# Patient Record
Sex: Male | Born: 1945 | Race: White | Hispanic: No | Marital: Married | Smoking: Former smoker
Health system: Southern US, Community
[De-identification: ages and names within clinical notes are randomized; demographics above are authoritative.]

## PROBLEM LIST (undated history)

## (undated) DIAGNOSIS — C3490 Malignant neoplasm of unspecified part of unspecified bronchus or lung: Secondary | ICD-10-CM

## (undated) DIAGNOSIS — D696 Thrombocytopenia, unspecified: Secondary | ICD-10-CM

## (undated) DIAGNOSIS — R161 Splenomegaly, not elsewhere classified: Secondary | ICD-10-CM

## (undated) DIAGNOSIS — I509 Heart failure, unspecified: Secondary | ICD-10-CM

## (undated) DIAGNOSIS — K7460 Unspecified cirrhosis of liver: Secondary | ICD-10-CM

## (undated) DIAGNOSIS — I10 Essential (primary) hypertension: Secondary | ICD-10-CM

## (undated) DIAGNOSIS — Z9221 Personal history of antineoplastic chemotherapy: Secondary | ICD-10-CM

## (undated) DIAGNOSIS — E119 Type 2 diabetes mellitus without complications: Secondary | ICD-10-CM

## (undated) DIAGNOSIS — H3320 Serous retinal detachment, unspecified eye: Secondary | ICD-10-CM

## (undated) DIAGNOSIS — K922 Gastrointestinal hemorrhage, unspecified: Secondary | ICD-10-CM

## (undated) DIAGNOSIS — H269 Unspecified cataract: Secondary | ICD-10-CM

## (undated) DIAGNOSIS — J449 Chronic obstructive pulmonary disease, unspecified: Secondary | ICD-10-CM

## (undated) DIAGNOSIS — R809 Proteinuria, unspecified: Secondary | ICD-10-CM

## (undated) DIAGNOSIS — E785 Hyperlipidemia, unspecified: Secondary | ICD-10-CM

## (undated) DIAGNOSIS — R7989 Other specified abnormal findings of blood chemistry: Secondary | ICD-10-CM

## (undated) DIAGNOSIS — D509 Iron deficiency anemia, unspecified: Secondary | ICD-10-CM

## (undated) DIAGNOSIS — J90 Pleural effusion, not elsewhere classified: Secondary | ICD-10-CM

## (undated) DIAGNOSIS — R188 Other ascites: Secondary | ICD-10-CM

## (undated) HISTORY — DX: Essential (primary) hypertension: I10

## (undated) HISTORY — DX: Gastrointestinal hemorrhage, unspecified: K92.2

## (undated) HISTORY — DX: Other specified abnormal findings of blood chemistry: R79.89

## (undated) HISTORY — PX: LAMINECTOMY: SHX219

## (undated) HISTORY — DX: Serous retinal detachment, unspecified eye: H33.20

## (undated) HISTORY — DX: Type 2 diabetes mellitus without complications: E11.9

## (undated) HISTORY — DX: Pleural effusion, not elsewhere classified: J90

## (undated) HISTORY — PX: OTHER SURGICAL HISTORY: SHX169

## (undated) HISTORY — DX: Unspecified cirrhosis of liver: K74.60

## (undated) HISTORY — DX: Proteinuria, unspecified: R80.9

## (undated) HISTORY — DX: Hyperlipidemia, unspecified: E78.5

## (undated) HISTORY — DX: Malignant neoplasm of unspecified part of unspecified bronchus or lung: C34.90

## (undated) HISTORY — DX: Thrombocytopenia, unspecified: D69.6

## (undated) HISTORY — DX: Chronic obstructive pulmonary disease, unspecified: J44.9

## (undated) HISTORY — DX: Unspecified cataract: H26.9

## (undated) HISTORY — DX: Heart failure, unspecified: I50.9

## (undated) HISTORY — DX: Other ascites: R18.8

## (undated) HISTORY — DX: Splenomegaly, not elsewhere classified: R16.1

## (undated) HISTORY — DX: Iron deficiency anemia, unspecified: D50.9

## (undated) HISTORY — DX: Personal history of antineoplastic chemotherapy: Z92.21

---

## 2003-09-29 ENCOUNTER — Other Ambulatory Visit: Payer: Self-pay

## 2003-11-20 ENCOUNTER — Ambulatory Visit: Payer: Self-pay | Admitting: Unknown Physician Specialty

## 2004-03-07 ENCOUNTER — Ambulatory Visit: Payer: Self-pay | Admitting: Orthopaedic Surgery

## 2008-05-18 ENCOUNTER — Ambulatory Visit: Payer: Self-pay | Admitting: Internal Medicine

## 2010-05-29 ENCOUNTER — Ambulatory Visit: Payer: Self-pay | Admitting: Cardiology

## 2010-06-18 ENCOUNTER — Ambulatory Visit: Payer: Self-pay | Admitting: Cardiology

## 2010-10-15 ENCOUNTER — Ambulatory Visit: Payer: Self-pay | Admitting: Ophthalmology

## 2010-11-05 ENCOUNTER — Ambulatory Visit: Payer: Self-pay | Admitting: Ophthalmology

## 2011-01-13 ENCOUNTER — Ambulatory Visit: Payer: Self-pay | Admitting: Unknown Physician Specialty

## 2011-07-30 ENCOUNTER — Ambulatory Visit: Payer: Self-pay | Admitting: Cardiology

## 2011-07-30 LAB — CK TOTAL AND CKMB (NOT AT ARMC): CK-MB: 2 ng/mL (ref 0.5–3.6)

## 2011-07-31 LAB — BASIC METABOLIC PANEL
Calcium, Total: 8.5 mg/dL (ref 8.5–10.1)
Chloride: 99 mmol/L (ref 98–107)
Co2: 26 mmol/L (ref 21–32)
Creatinine: 2.04 mg/dL — ABNORMAL HIGH (ref 0.60–1.30)
EGFR (African American): 39 — ABNORMAL LOW
Potassium: 4.2 mmol/L (ref 3.5–5.1)
Sodium: 133 mmol/L — ABNORMAL LOW (ref 136–145)

## 2012-04-29 ENCOUNTER — Ambulatory Visit: Payer: Self-pay | Admitting: Cardiology

## 2012-09-27 ENCOUNTER — Ambulatory Visit: Payer: Self-pay | Admitting: Internal Medicine

## 2012-10-18 ENCOUNTER — Inpatient Hospital Stay: Payer: Self-pay | Admitting: Internal Medicine

## 2012-10-18 LAB — CBC
HCT: 16.1 % — ABNORMAL LOW (ref 40.0–52.0)
HGB: 4.9 g/dL — CL (ref 13.0–18.0)
MCHC: 30.3 g/dL — ABNORMAL LOW (ref 32.0–36.0)
MCV: 71 fL — ABNORMAL LOW (ref 80–100)
Platelet: 88 10*3/uL — ABNORMAL LOW (ref 150–440)
RBC: 2.28 10*6/uL — ABNORMAL LOW (ref 4.40–5.90)
RDW: 20.2 % — ABNORMAL HIGH (ref 11.5–14.5)
WBC: 3.9 10*3/uL (ref 3.8–10.6)

## 2012-10-18 LAB — COMPREHENSIVE METABOLIC PANEL
Albumin: 3 g/dL — ABNORMAL LOW (ref 3.4–5.0)
Anion Gap: 7 (ref 7–16)
BUN: 31 mg/dL — ABNORMAL HIGH (ref 7–18)
Bilirubin,Total: 0.9 mg/dL (ref 0.2–1.0)
Calcium, Total: 8.9 mg/dL (ref 8.5–10.1)
Chloride: 103 mmol/L (ref 98–107)
Creatinine: 1.88 mg/dL — ABNORMAL HIGH (ref 0.60–1.30)
EGFR (African American): 42 — ABNORMAL LOW
EGFR (Non-African Amer.): 36 — ABNORMAL LOW
Glucose: 118 mg/dL — ABNORMAL HIGH (ref 65–99)
SGOT(AST): 39 U/L — ABNORMAL HIGH (ref 15–37)
Sodium: 137 mmol/L (ref 136–145)
Total Protein: 7.3 g/dL (ref 6.4–8.2)

## 2012-10-18 LAB — TROPONIN I: Troponin-I: 0.28 ng/mL — ABNORMAL HIGH

## 2012-10-19 LAB — CBC WITH DIFFERENTIAL/PLATELET
Basophil #: 0 10*3/uL (ref 0.0–0.1)
Basophil %: 0.8 %
Eosinophil %: 2.4 %
HCT: 20.8 % — ABNORMAL LOW (ref 40.0–52.0)
HGB: 6.5 g/dL — ABNORMAL LOW (ref 13.0–18.0)
Lymphocyte #: 0.8 10*3/uL — ABNORMAL LOW (ref 1.0–3.6)
MCH: 23 pg — ABNORMAL LOW (ref 26.0–34.0)
MCHC: 31.1 g/dL — ABNORMAL LOW (ref 32.0–36.0)
MCV: 74 fL — ABNORMAL LOW (ref 80–100)
Monocyte #: 0.5 x10 3/mm (ref 0.2–1.0)
Neutrophil #: 2.9 10*3/uL (ref 1.4–6.5)
Platelet: 91 10*3/uL — ABNORMAL LOW (ref 150–440)
RBC: 2.81 10*6/uL — ABNORMAL LOW (ref 4.40–5.90)
WBC: 4.3 10*3/uL (ref 3.8–10.6)

## 2012-10-19 LAB — BASIC METABOLIC PANEL
Anion Gap: 8 (ref 7–16)
Creatinine: 2.03 mg/dL — ABNORMAL HIGH (ref 0.60–1.30)
EGFR (African American): 38 — ABNORMAL LOW
Potassium: 3.5 mmol/L (ref 3.5–5.1)
Sodium: 136 mmol/L (ref 136–145)

## 2012-10-19 LAB — TROPONIN I: Troponin-I: 0.27 ng/mL — ABNORMAL HIGH

## 2012-10-19 LAB — CK TOTAL AND CKMB (NOT AT ARMC)
CK, Total: 135 U/L (ref 35–232)
CK-MB: 2.5 ng/mL (ref 0.5–3.6)
CK-MB: 2.3 ng/mL (ref 0.5–3.6)

## 2012-10-20 LAB — BASIC METABOLIC PANEL
BUN: 26 mg/dL — ABNORMAL HIGH (ref 7–18)
Chloride: 105 mmol/L (ref 98–107)
Co2: 24 mmol/L (ref 21–32)
EGFR (African American): 43 — ABNORMAL LOW
EGFR (Non-African Amer.): 37 — ABNORMAL LOW
Glucose: 91 mg/dL (ref 65–99)
Osmolality: 278 (ref 275–301)
Potassium: 3.4 mmol/L — ABNORMAL LOW (ref 3.5–5.1)

## 2012-10-20 LAB — CBC WITH DIFFERENTIAL/PLATELET
Basophil #: 0 10*3/uL (ref 0.0–0.1)
Basophil %: 0.4 %
Eosinophil #: 0.1 10*3/uL (ref 0.0–0.7)
Eosinophil %: 2.3 %
Lymphocyte #: 0.7 10*3/uL — ABNORMAL LOW (ref 1.0–3.6)
Lymphocyte %: 13.3 %
MCH: 24.5 pg — ABNORMAL LOW (ref 26.0–34.0)
Monocyte #: 0.6 x10 3/mm (ref 0.2–1.0)
Monocyte %: 11.6 %
Neutrophil #: 4 10*3/uL (ref 1.4–6.5)
WBC: 5.5 10*3/uL (ref 3.8–10.6)

## 2012-10-20 LAB — PROTIME-INR
INR: 1.2
Prothrombin Time: 15.7 secs — ABNORMAL HIGH (ref 11.5–14.7)

## 2012-10-22 LAB — PATHOLOGY REPORT

## 2012-11-08 ENCOUNTER — Ambulatory Visit: Payer: Self-pay | Admitting: Internal Medicine

## 2012-11-08 LAB — CBC CANCER CENTER
Basophil %: 0.5 %
HGB: 9 g/dL — ABNORMAL LOW (ref 13.0–18.0)
Lymphocyte %: 14.1 %
MCH: 23.7 pg — ABNORMAL LOW (ref 26.0–34.0)
MCV: 76 fL — ABNORMAL LOW (ref 80–100)
Monocyte #: 0.6 x10 3/mm (ref 0.2–1.0)
Monocyte %: 14.9 %
Neutrophil #: 2.8 x10 3/mm (ref 1.4–6.5)
Neutrophil %: 68 %
RBC: 3.79 10*6/uL — ABNORMAL LOW (ref 4.40–5.90)
RDW: 20.2 % — ABNORMAL HIGH (ref 11.5–14.5)
WBC: 4.2 x10 3/mm (ref 3.8–10.6)

## 2012-11-08 LAB — POTASSIUM: Potassium: 3.4 mmol/L — ABNORMAL LOW (ref 3.5–5.1)

## 2012-11-09 LAB — KAPPA/LAMBDA FREE LIGHT CHAINS (ARMC)

## 2012-11-15 LAB — CANCER CENTER HEMOGLOBIN: HGB: 8.1 g/dL — ABNORMAL LOW (ref 13.0–18.0)

## 2012-11-15 LAB — IRON AND TIBC
Iron: 37 ug/dL — ABNORMAL LOW (ref 65–175)
Unbound Iron-Bind.Cap.: 401 ug/dL

## 2012-11-22 LAB — CANCER CENTER HEMOGLOBIN: HGB: 8.8 g/dL — ABNORMAL LOW (ref 13.0–18.0)

## 2012-11-27 ENCOUNTER — Ambulatory Visit: Payer: Self-pay | Admitting: Internal Medicine

## 2012-12-01 ENCOUNTER — Ambulatory Visit: Payer: Self-pay | Admitting: Internal Medicine

## 2012-12-01 LAB — CANCER CENTER HEMOGLOBIN: HGB: 9.7 g/dL — ABNORMAL LOW (ref 13.0–18.0)

## 2012-12-01 LAB — FERRITIN: Ferritin (ARMC): 35 ng/mL (ref 8–388)

## 2012-12-15 LAB — CBC CANCER CENTER
Basophil #: 0 x10 3/mm (ref 0.0–0.1)
Eosinophil %: 3.5 %
Lymphocyte #: 0.7 x10 3/mm — ABNORMAL LOW (ref 1.0–3.6)
Lymphocyte %: 24.6 %
Monocyte #: 0.4 x10 3/mm (ref 0.2–1.0)
Monocyte %: 14 %
Neutrophil #: 1.7 x10 3/mm (ref 1.4–6.5)
Platelet: 83 x10 3/mm — ABNORMAL LOW (ref 150–440)
RBC: 3.9 10*6/uL — ABNORMAL LOW (ref 4.40–5.90)
RDW: 20.9 % — ABNORMAL HIGH (ref 11.5–14.5)

## 2012-12-15 LAB — FERRITIN: Ferritin (ARMC): 13 ng/mL (ref 8–388)

## 2012-12-18 HISTORY — PX: COLONOSCOPY: SHX174

## 2012-12-21 ENCOUNTER — Ambulatory Visit: Payer: Self-pay | Admitting: Gastroenterology

## 2012-12-21 LAB — CBC WITH DIFFERENTIAL/PLATELET
Basophil #: 0 10*3/uL (ref 0.0–0.1)
Basophil %: 0.6 %
Eosinophil #: 0.1 10*3/uL (ref 0.0–0.7)
HGB: 10 g/dL — ABNORMAL LOW (ref 13.0–18.0)
Lymphocyte %: 25.6 %
MCH: 25.4 pg — ABNORMAL LOW (ref 26.0–34.0)
Monocyte #: 0.5 x10 3/mm (ref 0.2–1.0)
Monocyte %: 12.9 %
Neutrophil %: 57.3 %
Platelet: 95 10*3/uL — ABNORMAL LOW (ref 150–440)
RDW: 20.9 % — ABNORMAL HIGH (ref 11.5–14.5)
WBC: 3.7 10*3/uL — ABNORMAL LOW (ref 3.8–10.6)

## 2012-12-21 LAB — PROTIME-INR
INR: 1.1
Prothrombin Time: 14.6 secs (ref 11.5–14.7)

## 2012-12-22 LAB — PATHOLOGY REPORT

## 2012-12-27 ENCOUNTER — Ambulatory Visit: Payer: Self-pay | Admitting: Internal Medicine

## 2013-01-03 LAB — FERRITIN: Ferritin (ARMC): 11 ng/mL (ref 8–388)

## 2013-01-24 LAB — CBC CANCER CENTER
Basophil %: 0.6 %
Eosinophil %: 3.5 %
Lymphocyte %: 22.6 %
MCHC: 32.5 g/dL (ref 32.0–36.0)
MCV: 82 fL (ref 80–100)
RBC: 3.68 10*6/uL — ABNORMAL LOW (ref 4.40–5.90)
RDW: 22.3 % — ABNORMAL HIGH (ref 11.5–14.5)
WBC: 3.3 x10 3/mm — ABNORMAL LOW (ref 3.8–10.6)

## 2013-01-24 LAB — FERRITIN: Ferritin (ARMC): 17 ng/mL (ref 8–388)

## 2013-01-27 ENCOUNTER — Ambulatory Visit: Payer: Self-pay | Admitting: Family Medicine

## 2013-01-27 ENCOUNTER — Ambulatory Visit: Payer: Self-pay | Admitting: Internal Medicine

## 2013-02-14 LAB — FERRITIN: FERRITIN (ARMC): 31 ng/mL (ref 8–388)

## 2013-02-14 LAB — CANCER CENTER HEMOGLOBIN: HGB: 10.2 g/dL — AB (ref 13.0–18.0)

## 2013-02-27 ENCOUNTER — Ambulatory Visit: Payer: Self-pay | Admitting: Internal Medicine

## 2013-03-09 LAB — FERRITIN: Ferritin (ARMC): 29 ng/mL (ref 8–388)

## 2013-03-09 LAB — CANCER CENTER HEMOGLOBIN: HGB: 10.1 g/dL — ABNORMAL LOW (ref 13.0–18.0)

## 2013-03-10 LAB — IRON AND TIBC
IRON BIND. CAP.(TOTAL): 350 ug/dL (ref 250–450)
IRON SATURATION: 23 %
Iron: 81 ug/dL (ref 65–175)
UNBOUND IRON-BIND. CAP.: 269 ug/dL

## 2013-03-27 ENCOUNTER — Ambulatory Visit: Payer: Self-pay | Admitting: Internal Medicine

## 2013-03-30 LAB — CBC CANCER CENTER
BASOS ABS: 0 x10 3/mm (ref 0.0–0.1)
Basophil %: 0.6 %
EOS ABS: 0.1 x10 3/mm (ref 0.0–0.7)
Eosinophil %: 3.8 %
HCT: 32.7 % — ABNORMAL LOW (ref 40.0–52.0)
HGB: 10.8 g/dL — AB (ref 13.0–18.0)
Lymphocyte #: 0.7 x10 3/mm — ABNORMAL LOW (ref 1.0–3.6)
Lymphocyte %: 21.8 %
MCH: 29.4 pg (ref 26.0–34.0)
MCHC: 33 g/dL (ref 32.0–36.0)
MCV: 89 fL (ref 80–100)
Monocyte #: 0.4 x10 3/mm (ref 0.2–1.0)
Monocyte %: 11.2 %
Neutrophil #: 2.1 x10 3/mm (ref 1.4–6.5)
Neutrophil %: 62.6 %
Platelet: 76 x10 3/mm — ABNORMAL LOW (ref 150–440)
RBC: 3.67 10*6/uL — ABNORMAL LOW (ref 4.40–5.90)
RDW: 17.2 % — ABNORMAL HIGH (ref 11.5–14.5)
WBC: 3.3 x10 3/mm — AB (ref 3.8–10.6)

## 2013-03-30 LAB — FERRITIN: FERRITIN (ARMC): 29 ng/mL (ref 8–388)

## 2013-04-01 ENCOUNTER — Ambulatory Visit: Payer: Self-pay | Admitting: Gastroenterology

## 2013-04-27 ENCOUNTER — Ambulatory Visit: Payer: Self-pay | Admitting: Internal Medicine

## 2013-04-27 LAB — FERRITIN: Ferritin (ARMC): 23 ng/mL (ref 8–388)

## 2013-04-27 LAB — CANCER CENTER HEMOGLOBIN: HGB: 10.3 g/dL — ABNORMAL LOW (ref 13.0–18.0)

## 2013-05-27 ENCOUNTER — Ambulatory Visit: Payer: Self-pay | Admitting: Internal Medicine

## 2013-06-22 ENCOUNTER — Ambulatory Visit: Payer: Self-pay | Admitting: Internal Medicine

## 2013-06-22 LAB — CREATININE, SERUM
Creatinine: 1.56 mg/dL — ABNORMAL HIGH (ref 0.60–1.30)
EGFR (Non-African Amer.): 46 — ABNORMAL LOW
GFR CALC AF AMER: 53 — AB

## 2013-06-22 LAB — CBC CANCER CENTER
Basophil #: 0.1 x10 3/mm (ref 0.0–0.1)
Basophil %: 1.1 %
EOS PCT: 3 %
Eosinophil #: 0.2 x10 3/mm (ref 0.0–0.7)
HCT: 28.7 % — AB (ref 40.0–52.0)
HGB: 9.4 g/dL — AB (ref 13.0–18.0)
Lymphocyte #: 0.8 x10 3/mm — ABNORMAL LOW (ref 1.0–3.6)
Lymphocyte %: 16.1 %
MCH: 30.7 pg (ref 26.0–34.0)
MCHC: 32.7 g/dL (ref 32.0–36.0)
MCV: 94 fL (ref 80–100)
Monocyte #: 0.4 x10 3/mm (ref 0.2–1.0)
Monocyte %: 8.6 %
NEUTROS ABS: 3.6 x10 3/mm (ref 1.4–6.5)
NEUTROS PCT: 71.2 %
Platelet: 117 x10 3/mm — ABNORMAL LOW (ref 150–440)
RBC: 3.06 10*6/uL — AB (ref 4.40–5.90)
RDW: 19.4 % — AB (ref 11.5–14.5)
WBC: 5.1 x10 3/mm (ref 3.8–10.6)

## 2013-06-22 LAB — IRON AND TIBC
Iron Bind.Cap.(Total): 350 ug/dL (ref 250–450)
Iron Saturation: 16 %
Iron: 56 ug/dL — ABNORMAL LOW (ref 65–175)
UNBOUND IRON-BIND. CAP.: 294 ug/dL

## 2013-06-22 LAB — FERRITIN: FERRITIN (ARMC): 27 ng/mL (ref 8–388)

## 2013-06-23 ENCOUNTER — Ambulatory Visit: Payer: Self-pay | Admitting: Gastroenterology

## 2013-06-27 ENCOUNTER — Ambulatory Visit: Payer: Self-pay | Admitting: Internal Medicine

## 2013-06-29 LAB — CANCER CENTER HEMOGLOBIN: HGB: 9.6 g/dL — AB (ref 13.0–18.0)

## 2013-07-18 ENCOUNTER — Ambulatory Visit: Payer: Self-pay | Admitting: Family Medicine

## 2013-07-22 LAB — PROTIME-INR
INR: 1.1
PROTHROMBIN TIME: 14.2 s (ref 11.5–14.7)

## 2013-07-22 LAB — COMPREHENSIVE METABOLIC PANEL
ALK PHOS: 162 U/L — AB
ANION GAP: 9 (ref 7–16)
Albumin: 3 g/dL — ABNORMAL LOW (ref 3.4–5.0)
BILIRUBIN TOTAL: 0.5 mg/dL (ref 0.2–1.0)
BUN: 26 mg/dL — AB (ref 7–18)
CHLORIDE: 103 mmol/L (ref 98–107)
Calcium, Total: 8.3 mg/dL — ABNORMAL LOW (ref 8.5–10.1)
Co2: 28 mmol/L (ref 21–32)
Creatinine: 1.82 mg/dL — ABNORMAL HIGH (ref 0.60–1.30)
EGFR (Non-African Amer.): 38 — ABNORMAL LOW
GFR CALC AF AMER: 44 — AB
Glucose: 177 mg/dL — ABNORMAL HIGH (ref 65–99)
OSMOLALITY: 289 (ref 275–301)
Potassium: 4 mmol/L (ref 3.5–5.1)
SGOT(AST): 51 U/L — ABNORMAL HIGH (ref 15–37)
SGPT (ALT): 39 U/L (ref 12–78)
Sodium: 140 mmol/L (ref 136–145)
Total Protein: 8.1 g/dL (ref 6.4–8.2)

## 2013-07-22 LAB — CBC CANCER CENTER
BASOS PCT: 0.5 %
Basophil #: 0 x10 3/mm (ref 0.0–0.1)
EOS ABS: 0.1 x10 3/mm (ref 0.0–0.7)
Eosinophil %: 3 %
HCT: 31.5 % — ABNORMAL LOW (ref 40.0–52.0)
HGB: 10.3 g/dL — ABNORMAL LOW (ref 13.0–18.0)
LYMPHS ABS: 0.5 x10 3/mm — AB (ref 1.0–3.6)
LYMPHS PCT: 14 %
MCH: 31.3 pg (ref 26.0–34.0)
MCHC: 32.7 g/dL (ref 32.0–36.0)
MCV: 96 fL (ref 80–100)
MONO ABS: 0.3 x10 3/mm (ref 0.2–1.0)
Monocyte %: 10.4 %
NEUTROS ABS: 2.3 x10 3/mm (ref 1.4–6.5)
NEUTROS PCT: 72.1 %
Platelet: 89 x10 3/mm — ABNORMAL LOW (ref 150–440)
RBC: 3.29 10*6/uL — AB (ref 4.40–5.90)
RDW: 18.8 % — ABNORMAL HIGH (ref 11.5–14.5)
WBC: 3.2 x10 3/mm — AB (ref 3.8–10.6)

## 2013-07-22 LAB — APTT: ACTIVATED PTT: 33.1 s (ref 23.6–35.9)

## 2013-07-27 ENCOUNTER — Ambulatory Visit: Payer: Self-pay | Admitting: Internal Medicine

## 2013-07-27 LAB — CBC CANCER CENTER
Basophil #: 0 x10 3/mm (ref 0.0–0.1)
Basophil %: 0.8 %
Eosinophil #: 0.1 x10 3/mm (ref 0.0–0.7)
Eosinophil %: 3 %
HCT: 30.7 % — ABNORMAL LOW (ref 40.0–52.0)
HGB: 10 g/dL — ABNORMAL LOW (ref 13.0–18.0)
LYMPHS PCT: 15.4 %
Lymphocyte #: 0.6 x10 3/mm — ABNORMAL LOW (ref 1.0–3.6)
MCH: 31.5 pg (ref 26.0–34.0)
MCHC: 32.6 g/dL (ref 32.0–36.0)
MCV: 97 fL (ref 80–100)
Monocyte #: 0.5 x10 3/mm (ref 0.2–1.0)
Monocyte %: 11.4 %
Neutrophil #: 2.9 x10 3/mm (ref 1.4–6.5)
Neutrophil %: 69.4 %
PLATELETS: 90 x10 3/mm — AB (ref 150–440)
RBC: 3.17 10*6/uL — AB (ref 4.40–5.90)
RDW: 18.9 % — ABNORMAL HIGH (ref 11.5–14.5)
WBC: 4.1 x10 3/mm (ref 3.8–10.6)

## 2013-07-27 LAB — FERRITIN: FERRITIN (ARMC): 37 ng/mL (ref 8–388)

## 2013-08-02 ENCOUNTER — Ambulatory Visit: Payer: Self-pay | Admitting: Cardiothoracic Surgery

## 2013-08-03 LAB — PATHOLOGY REPORT

## 2013-08-09 ENCOUNTER — Ambulatory Visit: Payer: Self-pay

## 2013-08-10 ENCOUNTER — Ambulatory Visit: Payer: Self-pay

## 2013-08-15 LAB — COMPREHENSIVE METABOLIC PANEL
ALBUMIN: 2.7 g/dL — AB (ref 3.4–5.0)
Alkaline Phosphatase: 162 U/L — ABNORMAL HIGH
Anion Gap: 9 (ref 7–16)
BUN: 36 mg/dL — AB (ref 7–18)
Bilirubin,Total: 0.4 mg/dL (ref 0.2–1.0)
CHLORIDE: 104 mmol/L (ref 98–107)
CO2: 25 mmol/L (ref 21–32)
Calcium, Total: 8.4 mg/dL — ABNORMAL LOW (ref 8.5–10.1)
Creatinine: 1.69 mg/dL — ABNORMAL HIGH (ref 0.60–1.30)
EGFR (African American): 48 — ABNORMAL LOW
EGFR (Non-African Amer.): 41 — ABNORMAL LOW
Glucose: 164 mg/dL — ABNORMAL HIGH (ref 65–99)
Osmolality: 288 (ref 275–301)
POTASSIUM: 3.7 mmol/L (ref 3.5–5.1)
SGOT(AST): 45 U/L — ABNORMAL HIGH (ref 15–37)
SGPT (ALT): 32 U/L (ref 12–78)
Sodium: 138 mmol/L (ref 136–145)
TOTAL PROTEIN: 7.6 g/dL (ref 6.4–8.2)

## 2013-08-15 LAB — CBC CANCER CENTER
BASOS PCT: 0.7 %
Basophil #: 0 x10 3/mm (ref 0.0–0.1)
EOS PCT: 3.3 %
Eosinophil #: 0.1 x10 3/mm (ref 0.0–0.7)
HCT: 29.8 % — ABNORMAL LOW (ref 40.0–52.0)
HGB: 9.7 g/dL — ABNORMAL LOW (ref 13.0–18.0)
LYMPHS PCT: 14.6 %
Lymphocyte #: 0.6 x10 3/mm — ABNORMAL LOW (ref 1.0–3.6)
MCH: 30.9 pg (ref 26.0–34.0)
MCHC: 32.7 g/dL (ref 32.0–36.0)
MCV: 95 fL (ref 80–100)
MONO ABS: 0.6 x10 3/mm (ref 0.2–1.0)
Monocyte %: 15 %
Neutrophil #: 2.6 x10 3/mm (ref 1.4–6.5)
Neutrophil %: 66.4 %
PLATELETS: 76 x10 3/mm — AB (ref 150–440)
RBC: 3.15 10*6/uL — ABNORMAL LOW (ref 4.40–5.90)
RDW: 18.4 % — ABNORMAL HIGH (ref 11.5–14.5)
WBC: 4 x10 3/mm (ref 3.8–10.6)

## 2013-08-15 LAB — MAGNESIUM: MAGNESIUM: 1.9 mg/dL

## 2013-08-18 ENCOUNTER — Ambulatory Visit: Payer: Self-pay | Admitting: Internal Medicine

## 2013-08-19 LAB — PROT IMMUNOELECT,UR-24HR

## 2013-08-23 LAB — CBC CANCER CENTER
BASOS ABS: 0 x10 3/mm (ref 0.0–0.1)
Basophil %: 0.8 %
EOS PCT: 2.9 %
Eosinophil #: 0.1 x10 3/mm (ref 0.0–0.7)
HCT: 30.1 % — ABNORMAL LOW (ref 40.0–52.0)
HGB: 9.9 g/dL — ABNORMAL LOW (ref 13.0–18.0)
LYMPHS ABS: 0.6 x10 3/mm — AB (ref 1.0–3.6)
Lymphocyte %: 16.9 %
MCH: 31.1 pg (ref 26.0–34.0)
MCHC: 33 g/dL (ref 32.0–36.0)
MCV: 94 fL (ref 80–100)
MONO ABS: 0.5 x10 3/mm (ref 0.2–1.0)
Monocyte %: 14.2 %
NEUTROS ABS: 2.4 x10 3/mm (ref 1.4–6.5)
Neutrophil %: 65.2 %
Platelet: 95 x10 3/mm — ABNORMAL LOW (ref 150–440)
RBC: 3.2 10*6/uL — ABNORMAL LOW (ref 4.40–5.90)
RDW: 18.4 % — ABNORMAL HIGH (ref 11.5–14.5)
WBC: 3.6 x10 3/mm — ABNORMAL LOW (ref 3.8–10.6)

## 2013-08-23 LAB — CREATININE CLEARANCE, URINE, 24 HOUR
Collection Hours: 24 hours
Creatinine Clearance: 44 mL/min — ABNORMAL LOW (ref 80–130)
Creatinine, Serum: 1.72 mg/dL — ABNORMAL HIGH (ref 0.50–1.20)
Creatinine, Urine: 41 mg/dL (ref 30.0–125.0)
Total Volume: 3950 mL

## 2013-08-23 LAB — CREATININE, SERUM
Creatinine: 1.72 mg/dL — ABNORMAL HIGH (ref 0.60–1.30)
EGFR (Non-African Amer.): 41 — ABNORMAL LOW
GFR CALC AF AMER: 47 — AB

## 2013-08-23 LAB — MAGNESIUM: MAGNESIUM: 2.1 mg/dL

## 2013-08-23 LAB — POTASSIUM: Potassium: 3.8 mmol/L (ref 3.5–5.1)

## 2013-08-27 ENCOUNTER — Ambulatory Visit: Payer: Self-pay | Admitting: Internal Medicine

## 2013-09-02 LAB — CBC CANCER CENTER
Basophil #: 0 x10 3/mm (ref 0.0–0.1)
Basophil %: 1.7 %
EOS ABS: 0 x10 3/mm (ref 0.0–0.7)
EOS PCT: 2.2 %
HCT: 24.9 % — AB (ref 40.0–52.0)
HGB: 8.2 g/dL — AB (ref 13.0–18.0)
LYMPHS ABS: 0.4 x10 3/mm — AB (ref 1.0–3.6)
LYMPHS PCT: 54 %
MCH: 30.8 pg (ref 26.0–34.0)
MCHC: 32.8 g/dL (ref 32.0–36.0)
MCV: 94 fL (ref 80–100)
MONO ABS: 0.1 x10 3/mm — AB (ref 0.2–1.0)
MONOS PCT: 13 %
NEUTROS ABS: 0.2 x10 3/mm — AB (ref 1.4–6.5)
Neutrophil %: 29.1 %
Platelet: 57 x10 3/mm — ABNORMAL LOW (ref 150–440)
RBC: 2.65 10*6/uL — ABNORMAL LOW (ref 4.40–5.90)
RDW: 18 % — AB (ref 11.5–14.5)
WBC: 0.8 x10 3/mm — CL (ref 3.8–10.6)

## 2013-09-02 LAB — CREATININE, SERUM
CREATININE: 1.91 mg/dL — AB (ref 0.60–1.30)
GFR CALC AF AMER: 41 — AB
GFR CALC NON AF AMER: 35 — AB

## 2013-09-02 LAB — POTASSIUM: Potassium: 4.4 mmol/L (ref 3.5–5.1)

## 2013-09-02 LAB — MAGNESIUM: MAGNESIUM: 1.8 mg/dL

## 2013-09-05 LAB — CBC CANCER CENTER
Basophil #: 0 x10 3/mm (ref 0.0–0.1)
Basophil %: 1.4 %
EOS ABS: 0 x10 3/mm (ref 0.0–0.7)
Eosinophil %: 0.8 %
HCT: 25.9 % — ABNORMAL LOW (ref 40.0–52.0)
HGB: 8.3 g/dL — AB (ref 13.0–18.0)
Lymphocyte #: 0.6 x10 3/mm — ABNORMAL LOW (ref 1.0–3.6)
Lymphocyte %: 16.8 %
MCH: 30.3 pg (ref 26.0–34.0)
MCHC: 32.1 g/dL (ref 32.0–36.0)
MCV: 94 fL (ref 80–100)
MONOS PCT: 8.9 %
Monocyte #: 0.3 x10 3/mm (ref 0.2–1.0)
Neutrophil #: 2.6 x10 3/mm (ref 1.4–6.5)
Neutrophil %: 72.1 %
Platelet: 46 x10 3/mm — ABNORMAL LOW (ref 150–440)
RBC: 2.76 10*6/uL — AB (ref 4.40–5.90)
RDW: 18.5 % — AB (ref 11.5–14.5)
WBC: 3.6 x10 3/mm — ABNORMAL LOW (ref 3.8–10.6)

## 2013-09-05 LAB — CREATININE, SERUM
Creatinine: 1.82 mg/dL — ABNORMAL HIGH (ref 0.60–1.30)
EGFR (African American): 44 — ABNORMAL LOW
GFR CALC NON AF AMER: 38 — AB

## 2013-09-05 LAB — POTASSIUM: Potassium: 3.7 mmol/L (ref 3.5–5.1)

## 2013-09-05 LAB — MAGNESIUM: Magnesium: 1.7 mg/dL — ABNORMAL LOW

## 2013-09-14 LAB — COMPREHENSIVE METABOLIC PANEL
ALK PHOS: 191 U/L — AB
ANION GAP: 10 (ref 7–16)
Albumin: 2.7 g/dL — ABNORMAL LOW (ref 3.4–5.0)
BUN: 21 mg/dL — ABNORMAL HIGH (ref 7–18)
Bilirubin,Total: 0.5 mg/dL (ref 0.2–1.0)
CREATININE: 1.69 mg/dL — AB (ref 0.60–1.30)
Calcium, Total: 7.7 mg/dL — ABNORMAL LOW (ref 8.5–10.1)
Chloride: 105 mmol/L (ref 98–107)
Co2: 26 mmol/L (ref 21–32)
GFR CALC AF AMER: 48 — AB
GFR CALC NON AF AMER: 41 — AB
GLUCOSE: 143 mg/dL — AB (ref 65–99)
Osmolality: 287 (ref 275–301)
Potassium: 3.5 mmol/L (ref 3.5–5.1)
SGOT(AST): 48 U/L — ABNORMAL HIGH (ref 15–37)
SGPT (ALT): 34 U/L
Sodium: 141 mmol/L (ref 136–145)
TOTAL PROTEIN: 6.7 g/dL (ref 6.4–8.2)

## 2013-09-14 LAB — CBC CANCER CENTER
BASOS ABS: 0 x10 3/mm (ref 0.0–0.1)
Basophil %: 0.7 %
EOS ABS: 0 x10 3/mm (ref 0.0–0.7)
Eosinophil %: 0.6 %
HCT: 25.5 % — AB (ref 40.0–52.0)
HGB: 8.5 g/dL — ABNORMAL LOW (ref 13.0–18.0)
LYMPHS ABS: 0.8 x10 3/mm — AB (ref 1.0–3.6)
Lymphocyte %: 11.8 %
MCH: 31.2 pg (ref 26.0–34.0)
MCHC: 33.3 g/dL (ref 32.0–36.0)
MCV: 94 fL (ref 80–100)
Monocyte #: 0.5 x10 3/mm (ref 0.2–1.0)
Monocyte %: 8.4 %
Neutrophil #: 5.1 x10 3/mm (ref 1.4–6.5)
Neutrophil %: 78.5 %
PLATELETS: 112 x10 3/mm — AB (ref 150–440)
RBC: 2.72 10*6/uL — ABNORMAL LOW (ref 4.40–5.90)
RDW: 19 % — AB (ref 11.5–14.5)
WBC: 6.5 x10 3/mm (ref 3.8–10.6)

## 2013-09-14 LAB — MAGNESIUM: MAGNESIUM: 1.5 mg/dL — AB

## 2013-09-22 LAB — CBC CANCER CENTER
Basophil #: 0 x10 3/mm (ref 0.0–0.1)
Basophil %: 1.1 %
EOS ABS: 0 x10 3/mm (ref 0.0–0.7)
EOS PCT: 0.8 %
HCT: 21.9 % — ABNORMAL LOW (ref 40.0–52.0)
HGB: 7.1 g/dL — ABNORMAL LOW (ref 13.0–18.0)
LYMPHS ABS: 0.2 x10 3/mm — AB (ref 1.0–3.6)
Lymphocyte %: 10.4 %
MCH: 30.4 pg (ref 26.0–34.0)
MCHC: 32.6 g/dL (ref 32.0–36.0)
MCV: 93 fL (ref 80–100)
MONO ABS: 0.1 x10 3/mm — AB (ref 0.2–1.0)
Monocyte %: 4.3 %
NEUTROS ABS: 1.8 x10 3/mm (ref 1.4–6.5)
NEUTROS PCT: 83.4 %
Platelet: 69 x10 3/mm — ABNORMAL LOW (ref 150–440)
RBC: 2.35 10*6/uL — AB (ref 4.40–5.90)
RDW: 18.5 % — ABNORMAL HIGH (ref 11.5–14.5)
WBC: 2.1 x10 3/mm — ABNORMAL LOW (ref 3.8–10.6)

## 2013-09-22 LAB — POTASSIUM: POTASSIUM: 4.3 mmol/L (ref 3.5–5.1)

## 2013-09-22 LAB — MAGNESIUM: Magnesium: 1.8 mg/dL

## 2013-09-22 LAB — CREATININE, SERUM
Creatinine: 1.7 mg/dL — ABNORMAL HIGH (ref 0.60–1.30)
GFR CALC AF AMER: 47 — AB
GFR CALC NON AF AMER: 41 — AB

## 2013-09-26 LAB — CBC CANCER CENTER
BASOS ABS: 0 x10 3/mm (ref 0.0–0.1)
Basophil %: 1.6 %
EOS ABS: 0 x10 3/mm (ref 0.0–0.7)
EOS PCT: 2.8 %
HCT: 23.4 % — AB (ref 40.0–52.0)
HGB: 7.6 g/dL — AB (ref 13.0–18.0)
LYMPHS ABS: 0.2 x10 3/mm — AB (ref 1.0–3.6)
LYMPHS PCT: 25.8 %
MCH: 30.8 pg (ref 26.0–34.0)
MCHC: 32.6 g/dL (ref 32.0–36.0)
MCV: 94 fL (ref 80–100)
MONOS PCT: 23.4 %
Monocyte #: 0.2 x10 3/mm (ref 0.2–1.0)
NEUTROS PCT: 46.4 %
Neutrophil #: 0.4 x10 3/mm — ABNORMAL LOW (ref 1.4–6.5)
PLATELETS: 46 x10 3/mm — AB (ref 150–440)
RBC: 2.48 10*6/uL — ABNORMAL LOW (ref 4.40–5.90)
RDW: 19 % — ABNORMAL HIGH (ref 11.5–14.5)
WBC: 0.9 x10 3/mm — AB (ref 3.8–10.6)

## 2013-09-26 LAB — CREATININE, SERUM
CREATININE: 1.74 mg/dL — AB (ref 0.60–1.30)
EGFR (Non-African Amer.): 40 — ABNORMAL LOW
GFR CALC AF AMER: 46 — AB

## 2013-09-26 LAB — POTASSIUM: Potassium: 3.7 mmol/L (ref 3.5–5.1)

## 2013-09-26 LAB — MAGNESIUM: MAGNESIUM: 1.7 mg/dL — AB

## 2013-09-27 ENCOUNTER — Ambulatory Visit: Payer: Self-pay | Admitting: Internal Medicine

## 2013-09-28 LAB — MAGNESIUM: Magnesium: 1.7 mg/dL — ABNORMAL LOW

## 2013-09-28 LAB — CBC CANCER CENTER
BASOS PCT: 2 %
Basophil #: 0 x10 3/mm (ref 0.0–0.1)
Eosinophil #: 0 x10 3/mm (ref 0.0–0.7)
Eosinophil %: 2.6 %
HCT: 24.7 % — ABNORMAL LOW (ref 40.0–52.0)
HGB: 8 g/dL — ABNORMAL LOW (ref 13.0–18.0)
Lymphocyte #: 0.2 x10 3/mm — ABNORMAL LOW (ref 1.0–3.6)
Lymphocyte %: 21 %
MCH: 30.7 pg (ref 26.0–34.0)
MCHC: 32.3 g/dL (ref 32.0–36.0)
MCV: 95 fL (ref 80–100)
Monocyte #: 0.2 x10 3/mm (ref 0.2–1.0)
Monocyte %: 26 %
Neutrophil #: 0.4 x10 3/mm — ABNORMAL LOW (ref 1.4–6.5)
Neutrophil %: 48.4 %
PLATELETS: 51 x10 3/mm — AB (ref 150–440)
RBC: 2.6 10*6/uL — ABNORMAL LOW (ref 4.40–5.90)
RDW: 19.4 % — AB (ref 11.5–14.5)
WBC: 0.9 x10 3/mm — CL (ref 3.8–10.6)

## 2013-10-05 LAB — COMPREHENSIVE METABOLIC PANEL
ALK PHOS: 173 U/L — AB
AST: 49 U/L — AB (ref 15–37)
Albumin: 2.8 g/dL — ABNORMAL LOW (ref 3.4–5.0)
Anion Gap: 12 (ref 7–16)
BUN: 24 mg/dL — ABNORMAL HIGH (ref 7–18)
Bilirubin,Total: 0.5 mg/dL (ref 0.2–1.0)
CHLORIDE: 104 mmol/L (ref 98–107)
CREATININE: 1.68 mg/dL — AB (ref 0.60–1.30)
Calcium, Total: 7.9 mg/dL — ABNORMAL LOW (ref 8.5–10.1)
Co2: 24 mmol/L (ref 21–32)
EGFR (African American): 48 — ABNORMAL LOW
GFR CALC NON AF AMER: 41 — AB
Glucose: 186 mg/dL — ABNORMAL HIGH (ref 65–99)
OSMOLALITY: 288 (ref 275–301)
Potassium: 3.6 mmol/L (ref 3.5–5.1)
SGPT (ALT): 35 U/L
Sodium: 140 mmol/L (ref 136–145)
Total Protein: 7.1 g/dL (ref 6.4–8.2)

## 2013-10-05 LAB — CBC WITH DIFFERENTIAL/PLATELET
Basophil #: 0 10*3/uL (ref 0.0–0.1)
Basophil %: 0.8 %
Eosinophil #: 0 10*3/uL (ref 0.0–0.7)
Eosinophil %: 1.5 %
HCT: 27.7 % — ABNORMAL LOW (ref 40.0–52.0)
HGB: 8.8 g/dL — ABNORMAL LOW (ref 13.0–18.0)
Lymphocyte #: 0.3 10*3/uL — ABNORMAL LOW (ref 1.0–3.6)
Lymphocyte %: 11.3 %
MCH: 29.9 pg (ref 26.0–34.0)
MCHC: 31.7 g/dL — ABNORMAL LOW (ref 32.0–36.0)
MCV: 94 fL (ref 80–100)
MONO ABS: 0.6 x10 3/mm (ref 0.2–1.0)
Monocyte %: 20.9 %
NEUTROS ABS: 1.8 10*3/uL (ref 1.4–6.5)
NEUTROS PCT: 65.5 %
Platelet: 103 10*3/uL — ABNORMAL LOW (ref 150–440)
RBC: 2.93 10*6/uL — ABNORMAL LOW (ref 4.40–5.90)
RDW: 19.7 % — ABNORMAL HIGH (ref 11.5–14.5)
WBC: 2.8 10*3/uL — AB (ref 3.8–10.6)

## 2013-10-05 LAB — MAGNESIUM: Magnesium: 1.6 mg/dL — ABNORMAL LOW

## 2013-10-10 LAB — BASIC METABOLIC PANEL
ANION GAP: 10 (ref 7–16)
BUN: 29 mg/dL — ABNORMAL HIGH (ref 7–18)
CHLORIDE: 103 mmol/L (ref 98–107)
Calcium, Total: 8.2 mg/dL — ABNORMAL LOW (ref 8.5–10.1)
Co2: 25 mmol/L (ref 21–32)
Creatinine: 1.91 mg/dL — ABNORMAL HIGH (ref 0.60–1.30)
EGFR (African American): 41 — ABNORMAL LOW
EGFR (Non-African Amer.): 35 — ABNORMAL LOW
Glucose: 306 mg/dL — ABNORMAL HIGH (ref 65–99)
Osmolality: 293 (ref 275–301)
POTASSIUM: 3.5 mmol/L (ref 3.5–5.1)
Sodium: 138 mmol/L (ref 136–145)

## 2013-10-10 LAB — CBC CANCER CENTER
BASOS PCT: 1.2 %
Basophil #: 0 x10 3/mm (ref 0.0–0.1)
EOS ABS: 0 x10 3/mm (ref 0.0–0.7)
Eosinophil %: 1.1 %
HCT: 28.8 % — ABNORMAL LOW (ref 40.0–52.0)
HGB: 9.3 g/dL — ABNORMAL LOW (ref 13.0–18.0)
Lymphocyte #: 0.4 x10 3/mm — ABNORMAL LOW (ref 1.0–3.6)
Lymphocyte %: 10.3 %
MCH: 29.8 pg (ref 26.0–34.0)
MCHC: 32.3 g/dL (ref 32.0–36.0)
MCV: 92 fL (ref 80–100)
MONO ABS: 0.5 x10 3/mm (ref 0.2–1.0)
Monocyte %: 15.1 %
NEUTROS ABS: 2.5 x10 3/mm (ref 1.4–6.5)
Neutrophil %: 72.3 %
Platelet: 99 x10 3/mm — ABNORMAL LOW (ref 150–440)
RBC: 3.12 10*6/uL — ABNORMAL LOW (ref 4.40–5.90)
RDW: 19.4 % — ABNORMAL HIGH (ref 11.5–14.5)
WBC: 3.5 x10 3/mm — ABNORMAL LOW (ref 3.8–10.6)

## 2013-10-10 LAB — MAGNESIUM: Magnesium: 1.7 mg/dL — ABNORMAL LOW

## 2013-10-19 LAB — CBC CANCER CENTER
BASOS ABS: 0 x10 3/mm (ref 0.0–0.1)
Basophil %: 1.2 %
EOS PCT: 1.9 %
Eosinophil #: 0 x10 3/mm (ref 0.0–0.7)
HCT: 24.1 % — ABNORMAL LOW (ref 40.0–52.0)
HGB: 7.7 g/dL — ABNORMAL LOW (ref 13.0–18.0)
LYMPHS PCT: 16.5 %
Lymphocyte #: 0.2 x10 3/mm — ABNORMAL LOW (ref 1.0–3.6)
MCH: 29.6 pg (ref 26.0–34.0)
MCHC: 32 g/dL (ref 32.0–36.0)
MCV: 92 fL (ref 80–100)
MONO ABS: 0.1 x10 3/mm — AB (ref 0.2–1.0)
Monocyte %: 9.3 %
NEUTROS ABS: 0.8 x10 3/mm — AB (ref 1.4–6.5)
Neutrophil %: 71.1 %
PLATELETS: 54 x10 3/mm — AB (ref 150–440)
RBC: 2.6 10*6/uL — AB (ref 4.40–5.90)
RDW: 18.2 % — AB (ref 11.5–14.5)
WBC: 1.1 x10 3/mm — AB (ref 3.8–10.6)

## 2013-10-19 LAB — BASIC METABOLIC PANEL
ANION GAP: 8 (ref 7–16)
BUN: 39 mg/dL — AB (ref 7–18)
CALCIUM: 8.3 mg/dL — AB (ref 8.5–10.1)
Chloride: 101 mmol/L (ref 98–107)
Co2: 27 mmol/L (ref 21–32)
Creatinine: 1.78 mg/dL — ABNORMAL HIGH (ref 0.60–1.30)
EGFR (African American): 45 — ABNORMAL LOW
EGFR (Non-African Amer.): 39 — ABNORMAL LOW
Glucose: 214 mg/dL — ABNORMAL HIGH (ref 65–99)
Osmolality: 288 (ref 275–301)
POTASSIUM: 3.9 mmol/L (ref 3.5–5.1)
Sodium: 136 mmol/L (ref 136–145)

## 2013-10-19 LAB — MAGNESIUM: Magnesium: 1.7 mg/dL — ABNORMAL LOW

## 2013-10-21 LAB — CBC CANCER CENTER
BASOS ABS: 0 x10 3/mm (ref 0.0–0.1)
Basophil %: 1.1 %
Eosinophil #: 0 x10 3/mm (ref 0.0–0.7)
Eosinophil %: 2.5 %
HCT: 23.2 % — ABNORMAL LOW (ref 40.0–52.0)
HGB: 7.5 g/dL — AB (ref 13.0–18.0)
Lymphocyte #: 0.3 x10 3/mm — ABNORMAL LOW (ref 1.0–3.6)
Lymphocyte %: 20 %
MCH: 30 pg (ref 26.0–34.0)
MCHC: 32.4 g/dL (ref 32.0–36.0)
MCV: 93 fL (ref 80–100)
Monocyte #: 0.3 x10 3/mm (ref 0.2–1.0)
Monocyte %: 19.8 %
Neutrophil #: 0.8 x10 3/mm — ABNORMAL LOW (ref 1.4–6.5)
Neutrophil %: 56.6 %
PLATELETS: 48 x10 3/mm — AB (ref 150–440)
RBC: 2.51 10*6/uL — AB (ref 4.40–5.90)
RDW: 18.7 % — ABNORMAL HIGH (ref 11.5–14.5)
WBC: 1.4 x10 3/mm — AB (ref 3.8–10.6)

## 2013-10-21 LAB — MAGNESIUM: Magnesium: 1.5 mg/dL — ABNORMAL LOW

## 2013-10-24 LAB — MAGNESIUM: Magnesium: 1.4 mg/dL — ABNORMAL LOW

## 2013-10-24 LAB — CBC CANCER CENTER
Basophil #: 0 x10 3/mm (ref 0.0–0.1)
Basophil %: 0.5 %
Eosinophil #: 0.1 x10 3/mm (ref 0.0–0.7)
Eosinophil %: 0.9 %
HCT: 25.1 % — ABNORMAL LOW (ref 40.0–52.0)
HGB: 8 g/dL — ABNORMAL LOW (ref 13.0–18.0)
Lymphocyte #: 0.6 x10 3/mm — ABNORMAL LOW (ref 1.0–3.6)
Lymphocyte %: 6.9 %
MCH: 29.4 pg (ref 26.0–34.0)
MCHC: 32 g/dL (ref 32.0–36.0)
MCV: 92 fL (ref 80–100)
MONO ABS: 0.9 x10 3/mm (ref 0.2–1.0)
MONOS PCT: 11 %
NEUTROS ABS: 6.7 x10 3/mm — AB (ref 1.4–6.5)
Neutrophil %: 80.7 %
Platelet: 72 x10 3/mm — ABNORMAL LOW (ref 150–440)
RBC: 2.73 10*6/uL — ABNORMAL LOW (ref 4.40–5.90)
RDW: 20.2 % — ABNORMAL HIGH (ref 11.5–14.5)
WBC: 8.3 x10 3/mm (ref 3.8–10.6)

## 2013-10-26 LAB — CBC CANCER CENTER
BASOS ABS: 0 x10 3/mm (ref 0.0–0.1)
Basophil %: 0.6 %
EOS ABS: 0 x10 3/mm (ref 0.0–0.7)
Eosinophil %: 0.7 %
HCT: 26.7 % — ABNORMAL LOW (ref 40.0–52.0)
HGB: 8.6 g/dL — ABNORMAL LOW (ref 13.0–18.0)
Lymphocyte #: 0.5 x10 3/mm — ABNORMAL LOW (ref 1.0–3.6)
Lymphocyte %: 9.4 %
MCH: 29.2 pg (ref 26.0–34.0)
MCHC: 32.2 g/dL (ref 32.0–36.0)
MCV: 91 fL (ref 80–100)
MONOS PCT: 5.6 %
Monocyte #: 0.3 x10 3/mm (ref 0.2–1.0)
Neutrophil #: 4.7 x10 3/mm (ref 1.4–6.5)
Neutrophil %: 83.7 %
PLATELETS: 70 x10 3/mm — AB (ref 150–440)
RBC: 2.95 10*6/uL — ABNORMAL LOW (ref 4.40–5.90)
RDW: 20.4 % — AB (ref 11.5–14.5)
WBC: 5.7 x10 3/mm (ref 3.8–10.6)

## 2013-10-27 ENCOUNTER — Ambulatory Visit: Payer: Self-pay | Admitting: Internal Medicine

## 2013-10-31 LAB — CBC CANCER CENTER
Basophil #: 0 x10 3/mm (ref 0.0–0.1)
Basophil %: 0.3 %
Eosinophil #: 0 x10 3/mm (ref 0.0–0.7)
Eosinophil %: 0.8 %
HCT: 25.3 % — ABNORMAL LOW (ref 40.0–52.0)
HGB: 7.9 g/dL — AB (ref 13.0–18.0)
LYMPHS PCT: 6.2 %
Lymphocyte #: 0.3 x10 3/mm — ABNORMAL LOW (ref 1.0–3.6)
MCH: 29.4 pg (ref 26.0–34.0)
MCHC: 31.1 g/dL — AB (ref 32.0–36.0)
MCV: 95 fL (ref 80–100)
MONOS PCT: 9.9 %
Monocyte #: 0.4 x10 3/mm (ref 0.2–1.0)
NEUTROS PCT: 82.8 %
Neutrophil #: 3.4 x10 3/mm (ref 1.4–6.5)
Platelet: 79 x10 3/mm — ABNORMAL LOW (ref 150–440)
RBC: 2.67 10*6/uL — AB (ref 4.40–5.90)
RDW: 21.8 % — ABNORMAL HIGH (ref 11.5–14.5)
WBC: 4.1 x10 3/mm (ref 3.8–10.6)

## 2013-11-02 LAB — CBC CANCER CENTER
Basophil #: 0 x10 3/mm (ref 0.0–0.1)
Basophil %: 0.9 %
EOS PCT: 1.1 %
Eosinophil #: 0 x10 3/mm (ref 0.0–0.7)
HCT: 26 % — ABNORMAL LOW (ref 40.0–52.0)
HGB: 8.2 g/dL — AB (ref 13.0–18.0)
Lymphocyte #: 0.3 x10 3/mm — ABNORMAL LOW (ref 1.0–3.6)
Lymphocyte %: 8.2 %
MCH: 30 pg (ref 26.0–34.0)
MCHC: 31.7 g/dL — ABNORMAL LOW (ref 32.0–36.0)
MCV: 95 fL (ref 80–100)
Monocyte #: 0.4 x10 3/mm (ref 0.2–1.0)
Monocyte %: 10.5 %
Neutrophil #: 3 x10 3/mm (ref 1.4–6.5)
Neutrophil %: 79.3 %
PLATELETS: 89 x10 3/mm — AB (ref 150–440)
RBC: 2.75 10*6/uL — ABNORMAL LOW (ref 4.40–5.90)
RDW: 22.1 % — AB (ref 11.5–14.5)
WBC: 3.8 x10 3/mm (ref 3.8–10.6)

## 2013-11-02 LAB — CREATININE, SERUM
CREATININE: 1.85 mg/dL — AB (ref 0.60–1.30)
EGFR (Non-African Amer.): 39 — ABNORMAL LOW
GFR CALC AF AMER: 47 — AB

## 2013-11-02 LAB — POTASSIUM: Potassium: 3.5 mmol/L (ref 3.5–5.1)

## 2013-11-02 LAB — MAGNESIUM: Magnesium: 1.6 mg/dL — ABNORMAL LOW

## 2013-11-07 LAB — CBC CANCER CENTER
Basophil #: 0 x10 3/mm (ref 0.0–0.1)
Basophil %: 0 %
EOS PCT: 1.2 %
Eosinophil #: 0 x10 3/mm (ref 0.0–0.7)
HCT: 26.3 % — ABNORMAL LOW (ref 40.0–52.0)
HGB: 8.5 g/dL — AB (ref 13.0–18.0)
LYMPHS ABS: 0.3 x10 3/mm — AB (ref 1.0–3.6)
LYMPHS PCT: 7 %
MCH: 30.6 pg (ref 26.0–34.0)
MCHC: 32.2 g/dL (ref 32.0–36.0)
MCV: 95 fL (ref 80–100)
MONOS PCT: 16.7 %
Monocyte #: 0.7 x10 3/mm (ref 0.2–1.0)
Neutrophil #: 3 x10 3/mm (ref 1.4–6.5)
Neutrophil %: 75.1 %
PLATELETS: 91 x10 3/mm — AB (ref 150–440)
RBC: 2.76 10*6/uL — ABNORMAL LOW (ref 4.40–5.90)
RDW: 22.3 % — ABNORMAL HIGH (ref 11.5–14.5)
WBC: 4 x10 3/mm (ref 3.8–10.6)

## 2013-11-07 LAB — COMPREHENSIVE METABOLIC PANEL
ALBUMIN: 2.7 g/dL — AB (ref 3.4–5.0)
Alkaline Phosphatase: 189 U/L — ABNORMAL HIGH
Anion Gap: 11 (ref 7–16)
BILIRUBIN TOTAL: 0.7 mg/dL (ref 0.2–1.0)
BUN: 34 mg/dL — ABNORMAL HIGH (ref 7–18)
Calcium, Total: 8 mg/dL — ABNORMAL LOW (ref 8.5–10.1)
Chloride: 103 mmol/L (ref 98–107)
Co2: 24 mmol/L (ref 21–32)
Creatinine: 1.77 mg/dL — ABNORMAL HIGH (ref 0.60–1.30)
EGFR (Non-African Amer.): 41 — ABNORMAL LOW
GFR CALC AF AMER: 50 — AB
GLUCOSE: 273 mg/dL — AB (ref 65–99)
Osmolality: 293 (ref 275–301)
Potassium: 3.2 mmol/L — ABNORMAL LOW (ref 3.5–5.1)
SGOT(AST): 92 U/L — ABNORMAL HIGH (ref 15–37)
SGPT (ALT): 60 U/L
Sodium: 138 mmol/L (ref 136–145)
Total Protein: 6.3 g/dL — ABNORMAL LOW (ref 6.4–8.2)

## 2013-11-07 LAB — MAGNESIUM: MAGNESIUM: 1.6 mg/dL — AB

## 2013-11-14 LAB — URINALYSIS, COMPLETE
BACTERIA: NONE SEEN
BILIRUBIN, UR: NEGATIVE
BLOOD: NEGATIVE
GLUCOSE, UR: NEGATIVE mg/dL (ref 0–75)
KETONE: NEGATIVE
Leukocyte Esterase: NEGATIVE
NITRITE: NEGATIVE
Ph: 6 (ref 4.5–8.0)
Protein: NEGATIVE
RBC,UR: 2 /HPF (ref 0–5)
Specific Gravity: 1.009 (ref 1.003–1.030)
WBC UR: 1 /HPF (ref 0–5)

## 2013-11-14 LAB — COMPREHENSIVE METABOLIC PANEL
ALBUMIN: 2.8 g/dL — AB (ref 3.4–5.0)
ALT: 34 U/L
Alkaline Phosphatase: 126 U/L — ABNORMAL HIGH
Anion Gap: 8 (ref 7–16)
BUN: 60 mg/dL — ABNORMAL HIGH (ref 7–18)
Bilirubin,Total: 0.9 mg/dL (ref 0.2–1.0)
Calcium, Total: 8.2 mg/dL — ABNORMAL LOW (ref 8.5–10.1)
Chloride: 101 mmol/L (ref 98–107)
Co2: 26 mmol/L (ref 21–32)
Creatinine: 1.93 mg/dL — ABNORMAL HIGH (ref 0.60–1.30)
EGFR (African American): 45 — ABNORMAL LOW
EGFR (Non-African Amer.): 37 — ABNORMAL LOW
Glucose: 91 mg/dL (ref 65–99)
OSMOLALITY: 287 (ref 275–301)
POTASSIUM: 3.8 mmol/L (ref 3.5–5.1)
SGOT(AST): 29 U/L (ref 15–37)
SODIUM: 135 mmol/L — AB (ref 136–145)
Total Protein: 6.7 g/dL (ref 6.4–8.2)

## 2013-11-14 LAB — CBC CANCER CENTER
BASOS ABS: 0 x10 3/mm (ref 0.0–0.1)
Basophil %: 0.4 %
Eosinophil #: 0.1 x10 3/mm (ref 0.0–0.7)
Eosinophil %: 2.1 %
HCT: 23.2 % — AB (ref 40.0–52.0)
HGB: 7.5 g/dL — ABNORMAL LOW (ref 13.0–18.0)
LYMPHS ABS: 0.2 x10 3/mm — AB (ref 1.0–3.6)
Lymphocyte %: 4.5 %
MCH: 30.7 pg (ref 26.0–34.0)
MCHC: 32.3 g/dL (ref 32.0–36.0)
MCV: 95 fL (ref 80–100)
Monocyte #: 0.1 x10 3/mm — ABNORMAL LOW (ref 0.2–1.0)
Monocyte %: 2.9 %
Neutrophil #: 3.3 x10 3/mm (ref 1.4–6.5)
Neutrophil %: 90.1 %
Platelet: 69 x10 3/mm — ABNORMAL LOW (ref 150–440)
RBC: 2.45 10*6/uL — ABNORMAL LOW (ref 4.40–5.90)
RDW: 21.7 % — AB (ref 11.5–14.5)
WBC: 3.6 x10 3/mm — AB (ref 3.8–10.6)

## 2013-11-14 LAB — MAGNESIUM: MAGNESIUM: 2.1 mg/dL

## 2013-11-15 LAB — URINE CULTURE

## 2013-11-15 LAB — PSA: PSA: 2.7 ng/mL (ref 0.0–4.0)

## 2013-11-19 LAB — CULTURE, BLOOD (SINGLE)

## 2013-11-21 LAB — CBC CANCER CENTER
Basophil #: 0 x10 3/mm (ref 0.0–0.1)
Basophil %: 1.1 %
Eosinophil #: 0 x10 3/mm (ref 0.0–0.7)
Eosinophil %: 0.2 %
HCT: 23.5 % — AB (ref 40.0–52.0)
HGB: 7.3 g/dL — AB (ref 13.0–18.0)
LYMPHS PCT: 12.1 %
Lymphocyte #: 0.5 x10 3/mm — ABNORMAL LOW (ref 1.0–3.6)
MCH: 29.8 pg (ref 26.0–34.0)
MCHC: 31.1 g/dL — AB (ref 32.0–36.0)
MCV: 96 fL (ref 80–100)
Monocyte #: 0.5 x10 3/mm (ref 0.2–1.0)
Monocyte %: 13.4 %
Neutrophil #: 2.8 x10 3/mm (ref 1.4–6.5)
Neutrophil %: 73.2 %
Platelet: 74 x10 3/mm — ABNORMAL LOW (ref 150–440)
RBC: 2.46 10*6/uL — AB (ref 4.40–5.90)
RDW: 21.4 % — ABNORMAL HIGH (ref 11.5–14.5)
WBC: 3.9 x10 3/mm (ref 3.8–10.6)

## 2013-11-21 LAB — CREATININE, SERUM
CREATININE: 1.79 mg/dL — AB (ref 0.60–1.30)
GFR CALC AF AMER: 49 — AB
GFR CALC NON AF AMER: 40 — AB

## 2013-11-21 LAB — MAGNESIUM: Magnesium: 1.8 mg/dL

## 2013-11-27 ENCOUNTER — Ambulatory Visit: Payer: Self-pay | Admitting: Internal Medicine

## 2013-11-28 LAB — CBC CANCER CENTER
BASOS ABS: 0.1 x10 3/mm (ref 0.0–0.1)
BASOS PCT: 1.4 %
Eosinophil #: 0 x10 3/mm (ref 0.0–0.7)
Eosinophil %: 0.3 %
HCT: 25.1 % — ABNORMAL LOW (ref 40.0–52.0)
HGB: 7.9 g/dL — ABNORMAL LOW (ref 13.0–18.0)
Lymphocyte #: 0.5 x10 3/mm — ABNORMAL LOW (ref 1.0–3.6)
Lymphocyte %: 6.9 %
MCH: 30.4 pg (ref 26.0–34.0)
MCHC: 31.4 g/dL — ABNORMAL LOW (ref 32.0–36.0)
MCV: 97 fL (ref 80–100)
MONOS PCT: 11.3 %
Monocyte #: 0.8 x10 3/mm (ref 0.2–1.0)
NEUTROS ABS: 5.4 x10 3/mm (ref 1.4–6.5)
NEUTROS PCT: 80.1 %
PLATELETS: 100 x10 3/mm — AB (ref 150–440)
RBC: 2.59 10*6/uL — AB (ref 4.40–5.90)
RDW: 22.4 % — AB (ref 11.5–14.5)
WBC: 6.7 x10 3/mm (ref 3.8–10.6)

## 2013-12-01 ENCOUNTER — Ambulatory Visit: Payer: Self-pay | Admitting: Gastroenterology

## 2013-12-01 LAB — BASIC METABOLIC PANEL
Anion Gap: 9 (ref 7–16)
BUN: 20 mg/dL — ABNORMAL HIGH (ref 7–18)
CO2: 22 mmol/L (ref 21–32)
Calcium, Total: 8 mg/dL — ABNORMAL LOW (ref 8.5–10.1)
Chloride: 108 mmol/L — ABNORMAL HIGH (ref 98–107)
Creatinine: 1.81 mg/dL — ABNORMAL HIGH (ref 0.60–1.30)
EGFR (Non-African Amer.): 40 — ABNORMAL LOW
GFR CALC AF AMER: 48 — AB
Glucose: 59 mg/dL — ABNORMAL LOW (ref 65–99)
Osmolality: 278 (ref 275–301)
POTASSIUM: 4 mmol/L (ref 3.5–5.1)
Sodium: 139 mmol/L (ref 136–145)

## 2013-12-01 LAB — CBC WITH DIFFERENTIAL/PLATELET
Basophil #: 0 10*3/uL (ref 0.0–0.1)
Basophil %: 0.6 %
EOS ABS: 0 10*3/uL (ref 0.0–0.7)
Eosinophil %: 0.6 %
HCT: 24.8 % — AB (ref 40.0–52.0)
HGB: 7.8 g/dL — ABNORMAL LOW (ref 13.0–18.0)
Lymphocyte #: 0.4 10*3/uL — ABNORMAL LOW (ref 1.0–3.6)
Lymphocyte %: 7.7 %
MCH: 30.6 pg (ref 26.0–34.0)
MCHC: 31.5 g/dL — AB (ref 32.0–36.0)
MCV: 97 fL (ref 80–100)
MONO ABS: 0.6 x10 3/mm (ref 0.2–1.0)
Monocyte %: 12 %
NEUTROS ABS: 4.1 10*3/uL (ref 1.4–6.5)
NEUTROS PCT: 79.1 %
Platelet: 83 10*3/uL — ABNORMAL LOW (ref 150–440)
RBC: 2.56 10*6/uL — AB (ref 4.40–5.90)
RDW: 22.9 % — ABNORMAL HIGH (ref 11.5–14.5)
WBC: 5.2 10*3/uL (ref 3.8–10.6)

## 2013-12-01 LAB — PROTIME-INR
INR: 1.2
Prothrombin Time: 15.2 secs — ABNORMAL HIGH (ref 11.5–14.7)

## 2013-12-01 LAB — HEPATIC FUNCTION PANEL A (ARMC)
AST: 39 U/L — AB (ref 15–37)
Albumin: 2.8 g/dL — ABNORMAL LOW (ref 3.4–5.0)
Alkaline Phosphatase: 136 U/L — ABNORMAL HIGH
Bilirubin, Direct: 0.2 mg/dL (ref 0.0–0.2)
Bilirubin,Total: 0.7 mg/dL (ref 0.2–1.0)
SGPT (ALT): 21 U/L
Total Protein: 6.7 g/dL (ref 6.4–8.2)

## 2013-12-01 LAB — APTT: Activated PTT: 33.8 secs (ref 23.6–35.9)

## 2013-12-06 ENCOUNTER — Ambulatory Visit: Payer: Self-pay | Admitting: Gastroenterology

## 2013-12-06 LAB — BASIC METABOLIC PANEL
ANION GAP: 9 (ref 7–16)
BUN: 24 mg/dL — AB (ref 7–18)
CHLORIDE: 106 mmol/L (ref 98–107)
CO2: 23 mmol/L (ref 21–32)
CREATININE: 1.72 mg/dL — AB (ref 0.60–1.30)
Calcium, Total: 7.9 mg/dL — ABNORMAL LOW (ref 8.5–10.1)
GFR CALC AF AMER: 51 — AB
GFR CALC NON AF AMER: 42 — AB
GLUCOSE: 181 mg/dL — AB (ref 65–99)
OSMOLALITY: 284 (ref 275–301)
Potassium: 3.8 mmol/L (ref 3.5–5.1)
SODIUM: 138 mmol/L (ref 136–145)

## 2013-12-06 LAB — CBC WITH DIFFERENTIAL/PLATELET
Basophil #: 0 10*3/uL (ref 0.0–0.1)
Basophil %: 0.6 %
Eosinophil #: 0.1 10*3/uL (ref 0.0–0.7)
Eosinophil %: 1.7 %
HCT: 26.5 % — ABNORMAL LOW (ref 40.0–52.0)
HGB: 7.8 g/dL — ABNORMAL LOW (ref 13.0–18.0)
Lymphocyte #: 0.3 10*3/uL — ABNORMAL LOW (ref 1.0–3.6)
Lymphocyte %: 8.1 %
MCH: 30.5 pg (ref 26.0–34.0)
MCHC: 29.5 g/dL — AB (ref 32.0–36.0)
MCV: 104 fL — AB (ref 80–100)
MONOS PCT: 14.9 %
Monocyte #: 0.5 x10 3/mm (ref 0.2–1.0)
NEUTROS PCT: 74.7 %
Neutrophil #: 2.5 10*3/uL (ref 1.4–6.5)
Platelet: 73 10*3/uL — ABNORMAL LOW (ref 150–440)
RBC: 2.56 10*6/uL — ABNORMAL LOW (ref 4.40–5.90)
RDW: 22.3 % — ABNORMAL HIGH (ref 11.5–14.5)
WBC: 3.4 10*3/uL — AB (ref 3.8–10.6)

## 2013-12-06 LAB — PROTIME-INR
INR: 1.2
PROTHROMBIN TIME: 15.2 s — AB (ref 11.5–14.7)

## 2013-12-06 LAB — HEPATIC FUNCTION PANEL A (ARMC)
ALK PHOS: 126 U/L — AB
Albumin: 2.6 g/dL — ABNORMAL LOW (ref 3.4–5.0)
BILIRUBIN TOTAL: 0.8 mg/dL (ref 0.2–1.0)
Bilirubin, Direct: 0.2 mg/dL (ref 0.0–0.2)
SGOT(AST): 35 U/L (ref 15–37)
SGPT (ALT): 18 U/L
TOTAL PROTEIN: 6.9 g/dL (ref 6.4–8.2)

## 2013-12-07 ENCOUNTER — Ambulatory Visit: Payer: Self-pay | Admitting: Gastroenterology

## 2013-12-07 LAB — BODY FLUID CELL COUNT WITH DIFFERENTIAL
Basophil: 0 %
Eosinophil: 0 %
Lymphocytes: 62 %
NUCLEATED CELL COUNT: 192 /mm3
Neutrophils: 11 %
Other Cells BF: 0 %
Other Mononuclear Cells: 27 %

## 2013-12-07 LAB — PROTEIN, BODY FLUID: Protein, Body Fluid: 2.2 g/dL

## 2013-12-07 LAB — ALBUMIN, FLUID (OTHER): BODY FLUID ALBUMIN: 1.1 g/dL

## 2013-12-11 LAB — BODY FLUID CULTURE

## 2013-12-12 LAB — CBC CANCER CENTER
BASOS PCT: 1 %
Basophil #: 0.1 x10 3/mm (ref 0.0–0.1)
EOS PCT: 3.4 %
Eosinophil #: 0.2 x10 3/mm (ref 0.0–0.7)
HCT: 28.1 % — AB (ref 40.0–52.0)
HGB: 8.7 g/dL — ABNORMAL LOW (ref 13.0–18.0)
Lymphocyte #: 0.3 x10 3/mm — ABNORMAL LOW (ref 1.0–3.6)
Lymphocyte %: 4.3 %
MCH: 30.2 pg (ref 26.0–34.0)
MCHC: 31.1 g/dL — ABNORMAL LOW (ref 32.0–36.0)
MCV: 97 fL (ref 80–100)
MONO ABS: 0.8 x10 3/mm (ref 0.2–1.0)
Monocyte %: 12.1 %
NEUTROS ABS: 5 x10 3/mm (ref 1.4–6.5)
NEUTROS PCT: 79.2 %
Platelet: 102 x10 3/mm — ABNORMAL LOW (ref 150–440)
RBC: 2.89 10*6/uL — AB (ref 4.40–5.90)
RDW: 21.2 % — AB (ref 11.5–14.5)
WBC: 6.3 x10 3/mm (ref 3.8–10.6)

## 2013-12-27 ENCOUNTER — Ambulatory Visit: Payer: Self-pay | Admitting: Internal Medicine

## 2014-01-06 ENCOUNTER — Ambulatory Visit: Payer: Self-pay | Admitting: Gastroenterology

## 2014-01-09 ENCOUNTER — Ambulatory Visit: Payer: Self-pay | Admitting: Internal Medicine

## 2014-01-09 LAB — CBC CANCER CENTER
BASOS PCT: 0.5 %
Basophil #: 0 x10 3/mm (ref 0.0–0.1)
EOS ABS: 0.1 x10 3/mm (ref 0.0–0.7)
Eosinophil %: 2.8 %
HCT: 29.2 % — ABNORMAL LOW (ref 40.0–52.0)
HGB: 9.1 g/dL — ABNORMAL LOW (ref 13.0–18.0)
LYMPHS PCT: 6.9 %
Lymphocyte #: 0.3 x10 3/mm — ABNORMAL LOW (ref 1.0–3.6)
MCH: 28.9 pg (ref 26.0–34.0)
MCHC: 31.2 g/dL — AB (ref 32.0–36.0)
MCV: 93 fL (ref 80–100)
MONO ABS: 0.5 x10 3/mm (ref 0.2–1.0)
Monocyte %: 11.4 %
NEUTROS ABS: 3.7 x10 3/mm (ref 1.4–6.5)
Neutrophil %: 78.4 %
Platelet: 107 x10 3/mm — ABNORMAL LOW (ref 150–440)
RBC: 3.15 10*6/uL — AB (ref 4.40–5.90)
RDW: 19.7 % — ABNORMAL HIGH (ref 11.5–14.5)
WBC: 4.7 x10 3/mm (ref 3.8–10.6)

## 2014-01-09 LAB — HEPATIC FUNCTION PANEL A (ARMC)
ALT: 29 U/L
AST: 40 U/L — AB (ref 15–37)
Albumin: 2.8 g/dL — ABNORMAL LOW (ref 3.4–5.0)
Alkaline Phosphatase: 137 U/L — ABNORMAL HIGH
Bilirubin, Direct: 0.2 mg/dL (ref 0.0–0.2)
Bilirubin,Total: 0.6 mg/dL (ref 0.2–1.0)
Total Protein: 7.5 g/dL (ref 6.4–8.2)

## 2014-01-25 ENCOUNTER — Ambulatory Visit: Payer: Self-pay | Admitting: Internal Medicine

## 2014-01-27 ENCOUNTER — Ambulatory Visit: Payer: Self-pay | Admitting: Internal Medicine

## 2014-02-03 LAB — HEPATIC FUNCTION PANEL A (ARMC)
ALT: 45 U/L
Albumin: 3 g/dL — ABNORMAL LOW (ref 3.4–5.0)
Alkaline Phosphatase: 198 U/L — ABNORMAL HIGH
BILIRUBIN TOTAL: 0.5 mg/dL (ref 0.2–1.0)
Bilirubin, Direct: 0.2 mg/dL (ref 0.0–0.2)
SGOT(AST): 62 U/L — ABNORMAL HIGH (ref 15–37)
Total Protein: 8.1 g/dL (ref 6.4–8.2)

## 2014-02-03 LAB — CBC CANCER CENTER
BASOS PCT: 0.4 %
Basophil #: 0 x10 3/mm (ref 0.0–0.1)
Eosinophil #: 0.1 x10 3/mm (ref 0.0–0.7)
Eosinophil %: 2.7 %
HCT: 28.4 % — AB (ref 40.0–52.0)
HGB: 9 g/dL — AB (ref 13.0–18.0)
Lymphocyte #: 0.3 x10 3/mm — ABNORMAL LOW (ref 1.0–3.6)
Lymphocyte %: 7.4 %
MCH: 28.3 pg (ref 26.0–34.0)
MCHC: 31.8 g/dL — AB (ref 32.0–36.0)
MCV: 89 fL (ref 80–100)
MONO ABS: 0.5 x10 3/mm (ref 0.2–1.0)
Monocyte %: 12.9 %
NEUTROS ABS: 3.1 x10 3/mm (ref 1.4–6.5)
Neutrophil %: 76.6 %
Platelet: 115 x10 3/mm — ABNORMAL LOW (ref 150–440)
RBC: 3.2 10*6/uL — ABNORMAL LOW (ref 4.40–5.90)
RDW: 21 % — ABNORMAL HIGH (ref 11.5–14.5)
WBC: 4.1 x10 3/mm (ref 3.8–10.6)

## 2014-02-03 LAB — IRON AND TIBC
IRON SATURATION: 20 %
Iron Bind.Cap.(Total): 356 ug/dL (ref 250–450)
Iron: 71 ug/dL (ref 65–175)
UNBOUND IRON-BIND. CAP.: 285 ug/dL

## 2014-02-03 LAB — CREATININE, SERUM
Creatinine: 2.35 mg/dL — ABNORMAL HIGH (ref 0.60–1.30)
EGFR (Non-African Amer.): 30 — ABNORMAL LOW
GFR CALC AF AMER: 36 — AB

## 2014-02-03 LAB — POTASSIUM: POTASSIUM: 4.2 mmol/L (ref 3.5–5.1)

## 2014-02-03 LAB — FERRITIN: FERRITIN (ARMC): 38 ng/mL (ref 8–388)

## 2014-02-14 ENCOUNTER — Ambulatory Visit: Payer: Self-pay | Admitting: Nephrology

## 2014-02-15 LAB — RENAL FUNCTION PANEL
ANION GAP: 10 (ref 7–16)
Albumin: 2.9 g/dL — ABNORMAL LOW (ref 3.4–5.0)
BUN: 53 mg/dL — ABNORMAL HIGH (ref 7–18)
CHLORIDE: 103 mmol/L (ref 98–107)
CO2: 21 mmol/L (ref 21–32)
Calcium, Total: 8.6 mg/dL (ref 8.5–10.1)
Creatinine: 2.4 mg/dL — ABNORMAL HIGH (ref 0.60–1.30)
EGFR (African American): 35 — ABNORMAL LOW
EGFR (Non-African Amer.): 29 — ABNORMAL LOW
Glucose: 212 mg/dL — ABNORMAL HIGH (ref 65–99)
Osmolality: 289 (ref 275–301)
Phosphorus: 3.2 mg/dL (ref 2.5–4.9)
Potassium: 5 mmol/L (ref 3.5–5.1)
Sodium: 134 mmol/L — ABNORMAL LOW (ref 136–145)

## 2014-02-16 LAB — URINALYSIS, COMPLETE
Bacteria: NONE SEEN
Bilirubin,UR: NEGATIVE
Blood: NEGATIVE
GLUCOSE, UR: NEGATIVE mg/dL (ref 0–75)
KETONE: NEGATIVE
LEUKOCYTE ESTERASE: NEGATIVE
NITRITE: NEGATIVE
PH: 6 (ref 4.5–8.0)
PROTEIN: NEGATIVE
RBC,UR: <1 /HPF (ref 0–5)
SPECIFIC GRAVITY: 1.013 (ref 1.003–1.030)
Squamous Epithelial: 2
WBC UR: 2 /HPF (ref 0–5)

## 2014-02-16 LAB — PROTEIN / CREATININE RATIO, URINE
CREATININE, URINE: 83.6 mg/dL (ref 30.0–125.0)
PROTEIN/CREAT. RATIO: 287 mg/g{creat} — AB (ref 0–200)
Protein, Random Urine: 24 mg/dL — ABNORMAL HIGH (ref 0–12)

## 2014-02-27 ENCOUNTER — Ambulatory Visit: Payer: Self-pay | Admitting: Internal Medicine

## 2014-03-06 LAB — CBC CANCER CENTER
Basophil #: 0 x10 3/mm (ref 0.0–0.1)
Basophil %: 0.2 %
Eosinophil #: 0 x10 3/mm (ref 0.0–0.7)
Eosinophil %: 0.3 %
HCT: 30.9 % — ABNORMAL LOW (ref 40.0–52.0)
HGB: 9.8 g/dL — ABNORMAL LOW (ref 13.0–18.0)
LYMPHS ABS: 0.3 x10 3/mm — AB (ref 1.0–3.6)
Lymphocyte %: 5.1 %
MCH: 29.4 pg (ref 26.0–34.0)
MCHC: 31.8 g/dL — ABNORMAL LOW (ref 32.0–36.0)
MCV: 92 fL (ref 80–100)
MONOS PCT: 9.5 %
Monocyte #: 0.7 x10 3/mm (ref 0.2–1.0)
NEUTROS ABS: 5.8 x10 3/mm (ref 1.4–6.5)
Neutrophil %: 84.9 %
Platelet: 85 x10 3/mm — ABNORMAL LOW (ref 150–440)
RBC: 3.35 10*6/uL — ABNORMAL LOW (ref 4.40–5.90)
RDW: 22.1 % — ABNORMAL HIGH (ref 11.5–14.5)
WBC: 6.9 x10 3/mm (ref 3.8–10.6)

## 2014-03-06 LAB — COMPREHENSIVE METABOLIC PANEL
ANION GAP: 12 (ref 7–16)
Albumin: 3.1 g/dL — ABNORMAL LOW (ref 3.4–5.0)
Alkaline Phosphatase: 123 U/L — ABNORMAL HIGH (ref 46–116)
BUN: 68 mg/dL — AB (ref 7–18)
Bilirubin,Total: 0.4 mg/dL (ref 0.2–1.0)
CHLORIDE: 104 mmol/L (ref 98–107)
CREATININE: 2.12 mg/dL — AB (ref 0.60–1.30)
Calcium, Total: 8.4 mg/dL — ABNORMAL LOW (ref 8.5–10.1)
Co2: 22 mmol/L (ref 21–32)
EGFR (Non-African Amer.): 33 — ABNORMAL LOW
GFR CALC AF AMER: 40 — AB
Glucose: 130 mg/dL — ABNORMAL HIGH (ref 65–99)
Osmolality: 297 (ref 275–301)
POTASSIUM: 5.1 mmol/L (ref 3.5–5.1)
SGOT(AST): 74 U/L — ABNORMAL HIGH (ref 15–37)
SGPT (ALT): 136 U/L — ABNORMAL HIGH (ref 14–63)
Sodium: 138 mmol/L (ref 136–145)
TOTAL PROTEIN: 7.6 g/dL (ref 6.4–8.2)

## 2014-03-24 ENCOUNTER — Inpatient Hospital Stay: Payer: Self-pay | Admitting: Internal Medicine

## 2014-03-28 ENCOUNTER — Ambulatory Visit
Admit: 2014-03-28 | Disposition: A | Discharge status: Home / Self Care | Payer: Self-pay | Attending: Internal Medicine | Admitting: Internal Medicine

## 2014-04-20 LAB — HEPATIC FUNCTION PANEL A (ARMC)
ALK PHOS: 116 U/L
ALT: 27 U/L
AST: 18 U/L
Albumin: 3.2 g/dL — ABNORMAL LOW
Bilirubin, Direct: 0.3 mg/dL
Bilirubin,Total: 1.2 mg/dL
Indirect Bilirubin: 0.9
Total Protein: 6.8 g/dL

## 2014-04-28 ENCOUNTER — Other Ambulatory Visit: Payer: Self-pay | Admitting: Internal Medicine

## 2014-04-28 ENCOUNTER — Ambulatory Visit
Admit: 2014-04-28 | Disposition: A | Discharge status: Home / Self Care | Payer: Self-pay | Attending: Internal Medicine | Admitting: Internal Medicine

## 2014-05-03 LAB — COMPREHENSIVE METABOLIC PANEL
Albumin: 3.5 g/dL
Alkaline Phosphatase: 105 U/L
Anion Gap: 7 (ref 7–16)
BILIRUBIN TOTAL: 0.9 mg/dL
BUN: 30 mg/dL — ABNORMAL HIGH
CHLORIDE: 97 mmol/L — AB
CO2: 22 mmol/L
CREATININE: 1.55 mg/dL — AB
Calcium, Total: 8.8 mg/dL — ABNORMAL LOW
EGFR (African American): 53 — ABNORMAL LOW
GFR CALC NON AF AMER: 46 — AB
Glucose: 93 mg/dL
POTASSIUM: 4.8 mmol/L
SGOT(AST): 27 U/L
SGPT (ALT): 14 U/L — ABNORMAL LOW
Sodium: 126 mmol/L — ABNORMAL LOW
TOTAL PROTEIN: 7.2 g/dL

## 2014-05-03 LAB — CBC CANCER CENTER
BASOS ABS: 0 x10 3/mm (ref 0.0–0.1)
Basophil %: 0.9 %
Eosinophil #: 0.2 x10 3/mm (ref 0.0–0.7)
Eosinophil %: 4 %
HCT: 32.6 % — ABNORMAL LOW (ref 40.0–52.0)
HGB: 10.6 g/dL — ABNORMAL LOW (ref 13.0–18.0)
Lymphocyte #: 0.4 x10 3/mm — ABNORMAL LOW (ref 1.0–3.6)
Lymphocyte %: 9.1 %
MCH: 30.9 pg (ref 26.0–34.0)
MCHC: 32.4 g/dL (ref 32.0–36.0)
MCV: 95 fL (ref 80–100)
MONO ABS: 0.8 x10 3/mm (ref 0.2–1.0)
Monocyte %: 17.4 %
NEUTROS PCT: 68.6 %
Neutrophil #: 3.3 x10 3/mm (ref 1.4–6.5)
Platelet: 88 x10 3/mm — ABNORMAL LOW (ref 150–440)
RBC: 3.42 10*6/uL — ABNORMAL LOW (ref 4.40–5.90)
RDW: 20.2 % — AB (ref 11.5–14.5)
WBC: 4.8 x10 3/mm (ref 3.8–10.6)

## 2014-05-03 LAB — CREATININE, SERUM: Creatine, Serum: 1.55

## 2014-05-19 NOTE — Op Note (Signed)
PATIENT NAME:  [name], [name] MR#:  [id number] DATE OF BIRTH:  December 03, 1945  DATE OF PROCEDURE:  10/20/2012  PREOPERATIVE DIAGNOSIS: Possible lesion in left piriform sinus.   POSTOPERATIVE DIAGNOSIS: Normal exam of the hypopharynx, larynx and tongue base.   PROCEDURE: Flexible fiber-optic nasal laryngoscopy.   SURGEON: Malon Kindle, MD   ANESTHESIA: Topical.   INDICATIONS: The patient underwent an upper GI endoscopy earlier today and there was concern of a possible mass or ulcer in the left piriform sinus. The patient has not had any significant dysphagia, sore throat or hoarseness.   FINDINGS: The nasopharynx, hypopharynx, larynx and tongue base were all completely clear. Vocal cords were clear and mobile. The left and right piriform sinuses were well visualized with no evidence of any mass lesion, ulceration, leukoplakia or other abnormality. There was no pooling of secretions.   DESCRIPTION OF PROCEDURE: After discussing the procedure with the patient, the right nasal cavity was anesthetized with topical 4% lidocaine. A flexible fiber-optic scope was passed through the right nasal cavity which was inspected. The nasopharynx was also inspected as the scope was passed. The hypopharynx, larynx and tongue base were carefully inspected. He was instructed to phonate. Vocal cord mobility was found to be normal. I had him protrude the tongue to better visualize the tongue base and there were no lesions seen here. I also had him insufflate hypopharynx with air to help improve visualization, which was excellent. There was absolutely no lesion in the piriform sinuses or postcricoid region. The vallecula and epiglottis were all unremarkable. The scope was withdrawn. The patient tolerated the procedure well.    ____________________________ Sammuel Hines. Richardson Landry, MD psb:cc D: 10/20/2012 18:07:21 ET T: 10/20/2012 22:40:37 ET JOB#: [id number]  cc: Sammuel Hines. Richardson Landry, MD, <Dictator> Riley Nearing  MD ELECTRONICALLY SIGNED 10/25/2012 15:07

## 2014-05-19 NOTE — Consult Note (Signed)
Brief Consult Note: Diagnosis: Anemia/CAD.   Patient was seen by consultant.   Consult note dictated.   Recommend to proceed with surgery or procedure.   Orders entered.   Discussed with Attending MD.   Comments: IMP Anemia CAD Obesity Leg edema Hx PCI stent . PLAN pt is an acceptable EGD/Scope risk Hold off on cardiac studies for now Agree with transfusion to at least 8/24.  Electronic Signatures: Lujean Amel D (MD)  (Signed 24-Sep-14 11:26)  Authored: Brief Consult Note   Last Updated: 24-Sep-14 11:26 by Yolonda Kida (MD)

## 2014-05-19 NOTE — Consult Note (Signed)
PATIENT NAME:  [name], [name] MR#:  [id number] DATE OF BIRTH:  January 23, 1946  DATE OF CONSULTATION:  10/20/2012  REFERRING PHYSICIAN:  Dr. Posey Pronto CONSULTING PHYSICIAN:  Sammuel Hines. Richardson Landry, MD  REASON FOR CONSULTATION: Possible mass in piriform sinus.   HISTORY OF PRESENT ILLNESS: This 69 year old male presents was admitted to the hospital with shortness of breath and found to have anemia. He underwent upper GI endoscopy  by Dr. Gustavo Lah today. Dr. Gustavo Lah found evidence of gastritis and gastroduodenitis and also indicated seeing an abnormality in the left piriform sinus with possible ulceration. The patient reports no significant issues with swallowing or sore throat or hoarseness. He says he occasionally might have some mild swallowing difficulty, but nothing progressive, and eats a normal diet including solid foods without difficulty. There is a history of thyroid goiter. He has a history of smoking 1 to 1-1/2 packs a day but quit 5 years ago.   PAST MEDICAL HISTORY: Non-insulin-dependent diabetes mellitus, hyperlipidemia, hypertension,  severe sleep apnea on CPAP, history of colon polyps causing heme-positive stools in the past, history of erosive gastropathy, history of coronary artery disease, status post stent placement in the proximal right coronary artery with subsequent placement of a drug-eluting stent.   PAST SURGICAL HISTORY: Fistula repair, laminectomy with ruptured disk, left cataract surgery, left detached retina surgery.   FAMILY HISTORY: Father died of an MI at 53. Mother has a history of MI.   SOCIAL HISTORY: Lives at home with his wife. He denies alcohol use. As stated above, he is a former smoker.   ALLERGIES: None.   MEDICATIONS ON ADMISSION:  Aspirin 81 mg daily, Flomax 0.4 mg daily, Lasix 40 mg p.o. b.i.d., Humulin R 500 units recombinant per mL 3 times daily according to sliding scale, metoprolol 25 mg p.o. b.i.d., omeprazole 20 mg p.o. daily, Plavix 75 mg daily, simvastatin  40 mg daily at bedtime, triamcinolone to affected areas b.i.d.   REVIEW OF SYSTEMS: He has had fatigue and weakness but no fever. He denies ear pain or hematemesis. He does have chest pain off and on with dyspnea on exertion and is currently being ruled out for MI. No cough, wheezing or hoarseness. No tinnitus or hearing loss.   PHYSICAL EXAMINATION: VITAL SIGNS: Temperature is 97.7, pulse 62, respirations 20; blood pressure is 132/75.  GENERAL: Morbidly obese male in no acute distress. He actually just finished eating his meal and did not have any difficulty swallowing.  HEAD AND FACE: Head is normocephalic, atraumatic. No facial skin lesions. Facial strength is normal symmetric. Ear exam: External ears, ear canals, tympanic membranes are clear bilaterally with no middle ear effusion or infection. Nasal exam: He is just a little congested. Septum deviates mildly towards the right. No purulence or polyps are seen. Oral cavity and oropharynx: The patient is partially edentulous. The tongue, floor of mouth, lips, posterior pharynx are all unremarkable. Tonsils are 2+ in size without exudate or mass. There is no evidence of leukoplakia.  NECK: The neck is supple without adenopathy or mass. There is some vague fullness in the thyroid gland but no palpable nodules. Salivary glands are soft and nontender without masses. Vocal quality is normal with no evidence of hoarseness at all.  NEUROLOGIC: Cranial nerves II through XII grossly intact.   Fiberoptic nasal laryngoscopy was performed. The hypopharynx, larynx and tongue base were all carefully inspected. I had excellent visualization of the piriform sinus on both sides and there was no evidence of any mass, ulceration, leukoplakia or  other abnormalities. Vocal cords were clear and mobile. The tongue base and epiglottis were all free of lesions.   ASSESSMENT: I cannot explain what Dr. Gustavo Lah may have seen during the upper gastrointestinal endoscopy. However,  this patient has a completely normal examination of the hypopharynx, larynx and tongue base. There is no evidence of a piriform sinus mass, ulcer or other abnormality.   PLAN: I can reassure the patient there is no evidence of an abnormality in the region of concern. He has also not had any symptoms referable to this region in terms of sore throat, hoarseness or significant swallowing difficulty.   ____________________________ Sammuel Hines. Richardson Landry, MD psb:jm D: 10/20/2012 18:05:02 ET T: 10/20/2012 19:46:08 ET JOB#: [id number]  cc: Sammuel Hines. Richardson Landry, MD, <Dictator> Riley Nearing MD ELECTRONICALLY SIGNED 10/25/2012 15:06

## 2014-05-19 NOTE — Consult Note (Signed)
PATIENT NAME:  [name], [name] MR#:  [id number] DATE OF BIRTH:  [birth year]  DATE OF CONSULTATION:  10/20/2012  REFERRING PHYSICIAN:   CONSULTING PHYSICIAN:  Simonne Come. Inez Pilgrim, MD  HISTORY OF PRESENT ILLNESS: [name] is a 69 year old patient who was admitted on September 22nd with GI bleed. He came to the Emergency Room with shortness of breath and chest pain. He had a hemoglobin of 4.9, compared to a baseline hemoglobin of 11.5 back in 2013. He had heme-positive stools.   PAST MEDICAL HISTORY: History of noninsulin-dependent diabetes, hyperlipidemia, hypertension, severe sleep apnea on CPAP, colon polyps by a colonoscopy in 2012, heme-positive stools in the past, EGD in 2002 and in 2005. He had sigmoid colon polyps and internal hemorrhoids in 2012. Coronary artery disease, status post stent placements. Drug-eluting stent placement in the proximal RCA in July 2013. Reports had a Myoview stress in April 2014 and was negative. Fistula repair, laminectomy. Left cataract and detached retina.   FAMILY HISTORY: MI. No malignancy in the family.   SOCIAL HISTORY: Lives at home with his wife. He is a former smoker.   ALLERGIES: No known drug allergies.   MEDICATIONS ON ADMISSION: Include aspirin and Plavix which have been stopped, Flomax, Lasix 40 b.i.d., Humulin on a sliding scale, metoprolol 25 b.i.d., omeprazole 20 daily, simvastatin 40 daily and triamcinolone to the skin.   CURRENT SYSTEM REVIEW: The patient was alert and cooperative. Has some fatigue but feels a lot better and stronger after the events of the last few days which included transfusion of 4 units of blood. He has no visual disturbance, but he has cataracts. No ear or jaw pain or hearing loss. No cough or wheezing. He is not short of breath now at rest. He has been short of breath prior at rest and on exertion. No current chest pain. He had chest pain on admission. No current abdominal pain. He has had reflux. No dysuria or hematuria.  No history prior of easy bleeding or bruising or was he aware of any anemia or blood diseases. No skin rash. He has arthritis but no swollen or deformed joints. No history of stroke or focal weakness or TIA, depression or anxiety.   HOSPITAL COURSE: At this point, the patient was given another 2 units of blood. His hemoglobin is up to 8.1. He underwent an EGD earlier. That report indicates, performed by Dr. Gustavo Lah, there was diffuse severe inflammation and edema in the stomach with shallow ulcerations in the gastric antrum. A papule was also found. Biopsy was taken of the stomach  The esophagus was normal. There was an abnormality in the left piriform sinus with possible ulceration or mass as well.   Consultation subsequently by Dr. Philippa Chester, who did a fiberoptic nasal laryngoscopy. Indicated that all were well visualized and no evidence of any mass or abnormalities.   PHYSICAL EXAMINATION:  GENERAL: The patient is alert and cooperative.  HEENT: Has some pallor. No jaundice. Sclerae clear. Mouth: No thrush.  NECK: No mass.  LYMPHATIC: No palpable lymph nodes in the neck, supraclavicular, submandibular or axilla.  LUNGS: Clear. No wheezing or rales. Decreased air entry.  HEART: Regular.  ABDOMEN: Obese, nontender. No palpable mass or organomegaly.  NEUROLOGIC: Grossly nonfocal. Cranial nerves intact.  EXTREMITIES: No significant edema. He has some chronic stasis change in the lower extremities and 1+ pitting edema.   LABORATORIES: On admission, MCV was 71, and his hemoglobin was 4.9. Platelet count was 88,000. Creatinine was 1.8. Liver functions were normal.  EKG showed a sinus arrhythmia with LVH. Review of the chart showed a creatinine was 2.0 in the past. He has had an ultrasound of the abdomen that shows apparent fatty liver and hepatosplenomegaly.   IMPRESSION AND PLAN: From the hematology point of view, the patient has anemia clearly due to blood loss. He may have had some pre-existing  anemia. He has azotemia. Unlikely that he has myeloma, as his azotemia was pre-existing but it would be prudent to check electrophoresis. Ultrasound revealed what looks most like fatty liver and/or cirrhosis with splenomegaly. It would also explain the low platelets. However, we will look for, currently no obvious evidence, of myelofibrosis, would later watch the peripheral smear and all of the blood counts when iron and blood is replenished. Also, no evidence of lymphoma but with hepatosplenomegaly would want to get a CT scan without contrast of the abdomen and pelvis. For completeness would want to check hepatitis serologies and also lymphopenia and low platelets and HIV. Will watch the hemoglobin. Will discuss with Dr. Gustavo Lah regarding possible intravenous iron. With the above findings, we likely do not want him on oral iron. If he can just correct his hemoglobin on dietary iron and shows improvement, that would be okay. If his hemoglobin does not reconstitute or drops, he would be a candidate for limited doses of intravenous iron. Finally, unrelated to anemia, I would review with him his smoking history and whether or not he has had any lung imaging.   PLAN: In review, I would confirm other causes of anemia are not present by checking a ferritin and later document if it improves, checking hepatitis C and B serologies and HIV, a CT scan of the abdomen and pelvis without contrast to further investigate hepatosplenomegaly, check the patient's smoking history and if 30 pack years or more, he is the appropriate age and smoking history to recommend a screening noncontrast CT of the chest. Serial CBC. Treat the GI issues with Protonix as per Dr. Gustavo Lah and follow up as per Dr. Gustavo Lah. Could follow up his hemoglobin in the [place] and could give intravenous iron if he does not reconstitute on dietary iron or if he is contraindicated or oral iron.    ____________________________ Simonne Come. Inez Pilgrim,  MD rgg:gb D: 10/20/2012 22:19:57 ET T: 10/20/2012 22:34:18 ET JOB#: [id number]  cc: Simonne Come. Inez Pilgrim, MD, <Dictator> Dallas Schimke MD ELECTRONICALLY SIGNED 10/27/2012 11:26

## 2014-05-19 NOTE — Consult Note (Signed)
PATIENT NAME:  [name], [name] MR#:  [id number] DATE OF BIRTH:  11-06-1945  DATE OF CONSULTATION:  10/19/2012  REFERRING PHYSICIAN:  Fritzi Mandes, MD CONSULTING PHYSICIAN:  Lollie Sails, MD  REASON FOR CONSULTATION: Anemia, Hemoccult positive stool.   HISTORY OF PRESENT ILLNESS: [name] is a very pleasant 69 year old Caucasian male who came to the Emergency Room last night, was admitted with finding of severe anemia. He was found to be Hemoccult positive on testing. He states that over the past several months he has had a progressive weakness with increasing shortness of breath and occasional chest pain. He denies any nausea, vomiting or abdominal pain. He has had no hematemesis or evidence of rectal bleeding. He does have some heartburn as well as bloating on occasion, this in the upper stomach region. There is no dysphagia. He states that he has had some increasing constipation over this time period as well, but denies any black stools, blood in stools or slimy stools. He does take aspirin and Plavix for his cardiac history. He had a colonoscopy in December of 2012 at that time being done for follow-up of his history of adenomatous colon polyps. There were 6 small polyps removed at that time. He also had some internal hemorrhoids. He had an EGD in August of 2005 at that time for epigastric abdominal pain which showed an erythematous gastropathy. He apparently subsequently underwent cholecystectomy.   GI FAMILY HISTORY: Negative for colorectal cancer, liver disease or ulcers.   PAST MEDICAL HISTORY: Non-insulin-dependent type 2 diabetes, hypertension and hyperlipidemia. He has very severe sleep apnea and uses a CPAP regularly. He has a history of coronary artery disease with a stent that has been done in the right coronary artery. This was revised with a second stent in the proximal right coronary artery in July of 2013. A Myoview stress test in April of 2014 was "essentially negative." He has  had history of colon polyps as noted. He has had a fistula repair. He has had a ruptured disk with laminectomy, left cataract surgery and a left detached retina in 2003.   SOCIAL HISTORY: He does not use alcohol. He is a former smoker. He lives with his wife.   ALLERGIES: No known drug allergies.   OUTPATIENT MEDICATIONS: Include: 1.  Aspirin 81 mg enteric-coated aspirin. 2.  Flomax 0.4 mg a day. 3.  Lasix 40 mg 1 tablet twice a day. 4.  Humulin R per sliding scale. 5.  Metoprolol 25 mg twice a day. 6.  Omeprazole 20 mg a day. 7.  Plavix 75 mg a day.  8.  Simvastatin 40 mg once a day. 9.  Triamcinolone topical cream p.r.n.   PHYSICAL EXAMINATION: VITAL SIGNS: Temperature 98.7, pulse 66, respirations 18, blood pressure 115/62, pulse ox 99%.  GENERAL: He is a 69 year old male, no acute distress, obese.  HEENT: Normocephalic, atraumatic. Eyes are anicteric. Nose: Septum midline. Oropharynx: No lesions.  NECK: No JVD. No lymphadenopathy. No thyromegaly.  HEART: Regular rate and rhythm.  LUNGS: Clear.  ABDOMEN: Soft, protuberant/obese. Unable to palpate internal organs. He is nontender. Bowel sounds positive, normoactive.  ANORECTAL: Deferred. He was found to be Hemoccult positive on admission to the hospital. He has had no bowel movement overnight.  EXTREMITIES: No clubbing or cyanosis. He does have a 1 to 2+ lower extremity edema. There is some evidence of peripheral vascular disease in the lower extremities. NEUROLOGICAL: Cranial nerves II through XII grossly intact. Muscle strength bilaterally equal and symmetric, 5/5. DTRs bilaterally  equal and symmetric.   LABORATORY AND DIAGNOSTICS: Include the following: On admission to the hospital, he had a glucose of 118, BUN 31, creatinine 1.88, sodium 137, potassium 3.5, chloride 103, bicarb 27, calcium 8.9. Hepatic profile was normal with the exception of a minimal increase of AST at 39. His albumin was low at 3.0. He has had cardiac enzymes x 3  these showing a slight increase of troponin I x 3 at 0.28, 0.27 and 0.27, respectively. His hemogram on admission showed a white count of 3.9, H and H 4.9/16.1, platelet count 88 and MCV 71. Repeat CBC this morning showed the hemoglobin status post 2 units of packed red cells at 6.5, platelet count of 91.   He had a PA and lateral chest film showing a mild right lower lobe infiltrate consistent with pneumonia.   ASSESSMENT: Profound anemia, microcytic in nature, in the setting of daily aspirin and NSAID use. The patient does have a history of coronary artery disease with stents. He has had no evidence of acute bleeding. Differential diagnosis very broad but to include multiple possibilities of slow gastrointestinal blood loss.   RECOMMENDATION: 1.  As discussed with Dr. Posey [name], recommend b.i.d. IV proton pump inhibitor. Once he is transfused to a level above 8, we will proceed with EGD and colonoscopy. These will be on different days as I need to clear the stomach before giving him a large volume prep.  2.  It is of interest that the patient has a low platelet count as well as a low albumin and may indeed for some amount of liver problem. We will recheck enzymes during the course of his hospitalization and consider abdominal ultrasound. I have discussed the risks, benefits and complications of EGD and colonoscopy to include but not limited to bleeding, infection, perforation and the risk of sedation, and he wishes to proceed.  ____________________________ Lollie Sails, MD mus:sb D: 10/19/2012 12:51:48 ET T: 10/19/2012 13:02:33 ET JOB#: [id number]  cc: Lollie Sails, MD, <Dictator> Lollie Sails MD ELECTRONICALLY SIGNED 10/26/2012 17:08

## 2014-05-19 NOTE — Consult Note (Signed)
PATIENT NAME:  [name], [name] MR#:  [id number] DATE OF BIRTH:  16-May-1945  DATE OF CONSULTATION:  10/19/2012  REFERRING PHYSICIAN:  Derinda Late, MD  CONSULTING PHYSICIAN:  Meko Bellanger D. Clayborn Bigness, MD PRIMARY CARDIOLOGIST: Isaias Cowman, MD    CHIEF COMPLAINT: Shortness of breath, chest pain.   HISTORY OF PRESENT ILLNESS: The patient is a 69 year old white male with medical history of coronary artery disease, chest pain, status post stent placement to RCA x 2, COPD, diabetes, hypercholesterolemia, obesity, came to the Emergency Room with several weeks' history of chest pain, shortness of breath and discomfort. He has had exertional discomfort, shortness of breath, dyspnea on exertion. The patient went to the Emergency Room and was found to have a hemoglobin of 5. His baseline hemoglobin was 11.5, heme-positive stools, and he was admitted for further evaluation and care, and a GI consultation was recommended. EKG was nondiagnostic.   REVIEW OF SYSTEMS: No blackout spells or syncope. No nausea or vomiting. No fever, chills or sweats. No weight loss, no weight gain.  No hemoptysis, no hematemesis. He has had heme positive stool but no bright red blood per rectum.     . No cough.    PAST MEDICAL HISTORY: 1.  Noninsulin-dependent diabetes x 2.  2.  Hyperlipidemia. 3.  Hypertension. 4.  Obstructive sleep apnea. 5.  Colonic polyps. 6.  Coronary artery disease.  PAST SURGICAL HISTORY:  1.  Fistula repair.  2.  Laminectomy.  3.  Left cataract surgery.  4.  Left detached retina.  5.  PCI and stent x 2.  FAMILY HISTORY: Myocardial infarction.  SOCIAL HISTORY: Lives with his wife. Former smoker. No alcohol consumption.  ALLERGIES: None.  MEDICATIONS: Aspirin 81 mg a day, Flomax 0.4 mg daily, Lasix 40 mg twice a day, Humulin, insulin as needed 3 times a day, metoprolol 25 mg twice a day, omeprazole 20 mg daily, Plavix 75 a day, simvastatin 40 at bedtime, triamcinolone cream to affected area  twice a day.  PHYSICAL EXAM: VITAL SIGNS; BP 160/60  , pulse 77, respiratory rate 16. HEAD, EYES, EARS, NOSE, AND THROAT: Normocephalic, atraumatic. Pupils are equal and reactive to light. NECK: Supple. No significant JVD, bruits or adenopathy. LUNGS: Clear to auscultation and percussion, no significant wheeze, rhonchi, or rales. HEART: Regular rate and rhythm. Positive bowel sounds. No rebound or guarding or tenderness. EXTREMITIES: Within normal limits. NEUROLOGIC: Intact. SKIN: Normal.   LABORATORY AND DIAGNOSTIC DATA: H and H 4.9 and 16, platelet count 88, MCV 71. Troponin 0.28, glucose of 118, BUN of 31, creatinine 1.8, sodium 137, potassium 2.5. LFTs are normal. EKG: Normal sinus rhythm, nonspecific ST-T wave changes, LVH,  ASSESSMENT: Anemia, coronary artery disease, obesity, history of obstructive sleep apnea, hyperlipidemia, diabetes, reflux, lower gastrointestinal bleeding, renal insufficiency.  PLAN: Agree with admit. Rule out for myocardial infarction. Follow up cardiac enzymes. Follow up EKGs. He has profound anemia, and I suspect his symptoms are related to profound anemia. I would recommend transfusion to get his hemoglobin at least to 8 but also recommend scope, upper and lower, as per GI. Will hold aspirin and Effient in the meantime and Plavix. Would also continue CPAP therapy.  Would also continue blood pressure control. Continue diabetes monitoring and control. Continue coronary artery disease therapy with follow-up for angina. I do not recommend any direct cardiac studies at this point until we are sure that his symptoms improve after profound anemia is corrected.  We will treat the patient conservatively with limited cardiac work-up for  this point. The patient appears to have some significant obstructive sleep apnea. We will continue CPAP as well. Hopefully, his scope will show the source of his bleeding.  ____________________________ Loran Senters. Clayborn Bigness,  MD ddc:cb D: 10/19/2012 15:32:47 ET T: 10/19/2012 16:07:46 ET JOB#: [id number]  cc: Cameron Delavega D. Clayborn Bigness, MD, <Dictator> Yolonda Kida MD ELECTRONICALLY SIGNED 11/18/2012 13:53

## 2014-05-19 NOTE — Consult Note (Signed)
Brief Consult Note: Diagnosis: anemia, heme positive stool.   Patient was seen by consultant.   Recommend further assessment or treatment.   Discussed with Attending MD.   Comments: Patietn seen and examined, full consult to follow. Patient admitted with severe symptomatic anemia, found with 4.2 hgb.  On combination of ASA and plavix.  Now with 2 unit tfx, still short of breath with orthopnea. Patietn states he has had several months of sx, including progressive constipation.  Recommend transfusing to about 8 hgb prior to egd and colonoscopy.  Hold anticoagulants for now.  Following.  Electronic Signatures for Addendum Section:  Loistine Simas (MD) (Signed Addendum 23-Sep-14 12:53)  please see full GI consult #[id number]   Electronic Signatures: Loistine Simas (MD)  (Signed 23-Sep-14 08:01)  Authored: Brief Consult Note   Last Updated: 23-Sep-14 12:53 by Loistine Simas (MD)

## 2014-05-19 NOTE — Consult Note (Signed)
Chief Complaint:  Subjective/Chief Complaint seen for anemia and heme positive stool.  bm last night not black, normal color.  denies abdominal pain or nausea, toleerated tfx, no chest pain.   VITAL SIGNS/ANCILLARY NOTES: **Vital Signs.:   24-Sep-14 08:00  Vital Signs Type Routine  Temperature Temperature (F) 97.7  Celsius 36.5  Temperature Source oral  Pulse Pulse 60  Respirations Respirations 18  Systolic BP Systolic BP 465  Diastolic BP (mmHg) Diastolic BP (mmHg) 47  Mean BP 70  Pulse Ox % Pulse Ox % 98  Pulse Ox Activity Level  At rest  Oxygen Delivery Room Air/ 21 %   Brief Assessment:  Cardiac Regular   Respiratory clear BS   Gastrointestinal details normal soft obese, non-tender, bowel sounds positive   Lab Results: Routine Chem:  24-Sep-14 05:01   Glucose, Serum 91  BUN  26  Creatinine (comp)  1.84  Sodium, Serum 137  Potassium, Serum  3.4  Chloride, Serum 105  CO2, Serum 24  Calcium (Total), Serum  8.4  Anion Gap 8  Osmolality (calc) 278  eGFR (African American)  43  eGFR (Non-African American)  37 (eGFR values <61m/min/1.73 m2 may be an indication of chronic kidney disease (CKD). Calculated eGFR is useful in patients with stable renal function. The eGFR calculation will not be reliable in acutely ill patients when serum creatinine is changing rapidly. It is not useful in  patients on dialysis. The eGFR calculation may not be applicable to patients at the low and high extremes of body sizes, pregnant women, and vegetarians.)  Routine Coag:  24-Sep-14 05:01   Prothrombin  15.7  INR 1.2 (INR reference interval applies to patients on anticoagulant therapy. A single INR therapeutic range for coumarins is not optimal for all indications; however, the suggested range for most indications is 2.0 - 3.0. Exceptions to the INR Reference Range may include: Prosthetic heart valves, acute myocardial infarction, prevention of myocardial infarction, and  combinations of aspirin and anticoagulant. The need for a higher or lower target INR must be assessed individually. Reference: The Pharmacology and Management of the Vitamin K  antagonists: the seventh ACCP Conference on Antithrombotic and Thrombolytic Therapy. CKCLEX.5170Sept:126 (3suppl): [id number] A HCT value >55% may artifactually increase the PT.  In one study,  the increase was an average of 25%. Reference:  "Effect on Routine and Special Coagulation Testing Values of Citrate Anticoagulant Adjustment in Patients with High HCT Values." American Journal of Clinical Pathology 2006;126:400-405.)  Routine Hem:  24-Sep-14 05:01   WBC (CBC) 5.5  RBC (CBC)  3.31  Hemoglobin (CBC)  8.1  Hematocrit (CBC)  25.1  Platelet Count (CBC)  93  MCV  76  MCH  24.5  MCHC 32.3  RDW  22.2  Neutrophil % 72.4  Lymphocyte % 13.3  Monocyte % 11.6  Eosinophil % 2.3  Basophil % 0.4  Neutrophil # 4.0  Lymphocyte #  0.7  Monocyte # 0.6  Eosinophil # 0.1  Basophil # 0.0 (Result(s) reported on 20 Oct 2012 at 05:33AM.)   Radiology Results: XRay:    24-Sep-14 09:39, Chest PA and Lateral  Chest PA and Lateral   REASON FOR EXAM:    compare- infiltrate.  COMMENTS:       PROCEDURE: DXR - DXR CHEST PA (OR AP) AND LATERAL  - Oct 20 2012  9:39AM     RESULT: Comparison is made to the exam of 10/18/2012.    Cardiac monitoring electrodes are present. There is a minimal atelectasis  of the right lung base. There is a small right pleural effusion. The   cardiac silhouette is normal. The lungs are otherwise clear.    IMPRESSION:   1. Small right pleural effusion. Minimal right lung base atelectasis.    Dictation Site: 2    Verified By: Sundra Aland, M.D., MD  [place]:    24-Sep-14 09:36, US Abdomen General Survey  US Abdomen General Survey   REASON FOR EXAM:    tcp  COMMENTS:       PROCEDURE: [place]  - US ABDOMEN GENERAL SURVEY  - Oct 20 2012  9:36AM     RESULT: There is a history of  cholecystectomy. There is limited   visualization of the pancreas. The proximal abdominal aorta measures 2.6   cm. The mid and distal portions are poorly seen. The proximal inferior   vena cava is patent. There is a right pleural effusion. There is a small   amount of fluid adjacent to the liver consistent with ascites. The   visualized portions of the pancreas appear to be grossly normal. The   spleen is enlarged measuring up to 18.64 cm appearing the portal venous   flow is normal. The liver measures 20.39 cm shows increased echotexture   consistent with hepatic steatosis. The right kidney measures 13.83 x 4.32   x 4.88 cm. The left kidney is 13.0 x 5.71 x 4.99 cm. There is no     hydronephrosis or solid or cystic mass. The common bile duct is 5.0 mm.    IMPRESSION:   1. Status post cholecystectomy.  2. Hepatosplenomegaly.  3. Limited visualizationof the aorta and pancreas without a focal   abnormality evident.    Dictation Site: 2        Verified By: Sundra Aland, M.D., MD   Assessment/Plan:  Assessment/Plan:  Assessment 1) anemia, heme positive stool.  s/p tfx 4 unit prbc.  no melena reported, no n/v.  some change of bh with increasing constipation over the past couple months. taking ASA and plavix until 2 days ago.   Plan 1) egd- I have discussed the risks benefits complicatiosn of egd to include nto limited to bleeding infection perforation and sedation and he wishes to proceed.  further recs to follow.   Electronic Signatures: Loistine Simas (MD)  (Signed 24-Sep-14 12:59)  Authored: Chief Complaint, VITAL SIGNS/ANCILLARY NOTES, Brief Assessment, Lab Results, Radiology Results, Assessment/Plan   Last Updated: 24-Sep-14 12:59 by Loistine Simas (MD)

## 2014-05-19 NOTE — H&P (Signed)
PATIENT NAME:  [name], [name] MR#:  [id number] DATE OF BIRTH:  18-Jul-1945  DATE OF ADMISSION:  10/18/2012  PRIMARY CARE PHYSICIAN:  Derinda Late, MD, [facility]   GASTROENTEROLOGY:  Manya Silvas, MD  CARDIOLOGY:  Isaias Cowman, MD  CHIEF COMPLAINT: Shortness of breath on and off for a few weeks along with chest tightness. The patient was found to have hemoglobin of 4.9.   HISTORY OF PRESENT ILLNESS: [name] is a 69 year old obese Caucasian gentleman with past medical history of coronary artery disease, status post stent placement in proximal RCA x 2, history of COPD, type 2 diabetes, hyperlipidemia, who comes into the Emergency Room after he was seen by his primary care physician for exertional shortness of breath and chest pain. The patient in the Emergency Room was found to have a hemoglobin of 4.9. His baseline hemoglobin in 2013 was 11.5. He also is heme-positive on his stools and is being admitted for exertional shortness of breath, chest pain and anemia which appears likely a slow GI bleed.    PAST MEDICAL HISTORY 1.  Non-insulin-dependent type 2 diabetes.  2.  Hyperlipidemia.  3.  Hypertension.  4.  Severe sleep apnea. The patient is on CPAP.  5.  Colon polyps by colonoscopy in 2012.  Heme-positive stools in the past. EGD in 2002 showed hiatal hernia and erosive gastropathy. EGD in 2005 was normal. The patient had colonoscopy 2012 showed small sigmoid polyps in the in the sigmoid colon along with internal hemorrhoids.  6.  Coronary artery disease, status post stent placement in proximal RCA in the past which developed a high-grade in-stent stenosis. Underwent a drug-eluting stent placement in the proximal RCA by Dr. Saralyn Pilar in July 2013. The patient's most recent Myoview stress test in April of 2014 was essentially negative.   PAST SURGICAL HISTORY 1.  Fistula repair.  2.  Laminectomy with ruptured disk.  3.  Left cataract surgery.  4.  Left detached retina  in May 2003.   FAMILY HISTORY: Father died of MI at 56, mother has history of MI.    SOCIAL HISTORY: Lives at home with his wife. Former smoker. No alcohol use.   ALLERGIES: No known drug allergies.   MEDICATIONS 1.  Aspirin 81 mg daily.  2.  Flomax 0.4 mg daily.  3.  Lasix 40 mg b.i.d.  4.   Humulin R 500 units recombinant 500 units per mL. The patient takes it 3 times a day according to his sliding scale.  5.  Metoprolol 25 mg b.i.d.  6.  Omeprazole 20 mg p.o. daily.  7.  Plavix 75 mg daily.  8.  Simvastatin 40 mg daily at bedtime.  9.  Triamcinolone to affected areas b.i.d.   REVIEW OF SYSTEMS CONSTITUTIONAL: Positive for fatigue, weakness. No fever.  EYES: No blurred or double vision. Positive for cataracts.  EARS, NOSE, THROAT: No tinnitus, ear pain, hearing loss or epistaxis.  RESPIRATORY: No cough, wheeze. Positive for shortness of breath on exertion. Positive for COPD.   CARDIOVASCULAR: On-and-off chest pain along with dyspnea on exertion and hypertension.  GASTROINTESTINAL: Positive for epigastric discomfort and GERD.  No nausea, vomiting, diarrhea, abdominal pain. Positive for heme-positive stools.  GENITOURINARY: No dysuria or hematuria.  ENDOCRINE: No polyuria, nocturia or thyroid problems.  HEMATOLOGY: No anemia or easy bruising or bleeding.  SKIN: No acne or rash.  MUSCULOSKELETAL: Positive for arthritis. No swelling or gout.  NEUROLOGIC: No CVA, TIA, dysarthria or dementia.  PSYCHIATRIC:  No anxiety or  depression, bipolar disorder. All other systems reviewed and negative.   PHYSICAL EXAMINATION GENERAL: The patient is awake, alert, oriented x 3, not in acute distress.  VITAL SIGNS: He is afebrile, pulse is 77, blood pressure is 157/40. Pulse oximetry is 100% on room air.  HEENT: Atraumatic, normocephalic. PERRLA, EOM intact. Oral mucosa is moist. Conjunctival pallor is present.   NECK: Supple. No JVD. No carotid bruit.  RESPIRATORY: Clear to auscultation, distant  breath sounds. No respiratory distress or labored breathing, audible wheezing or crackles heard.  CARDIOVASCULAR: Both heart sounds are normal. Rate, rhythm regular. PMI not lateralized. Chest nontender. Good pedal pulses, good femoral pulses. The patient has 2+ pitting edema with chronic venous stasis changes in both lower extremities.  ABDOMEN:  Soft, benign, nontender. No organomegaly. Positive bowel sounds.  NEUROLOGIC: Grossly intact cranial nerves II through XII. No motor or sensory deficit.  PSYCHIATRIC: The patient is awake, alert, oriented x 3.  SKIN: Warm and dry.   DIAGNOSTIC DATA:  H and H is 4.9 and 16.1, platelet count is 88, MCV is 71. Troponin is 0.28. Glucose is 118, BUN is 31, creatinine is 1.8, sodium is 137, potassium is 3.5. LFTs within normal limits. EKG showed normal sinus rhythm with sinus arrhythmia, LVH and LAD.   ASSESSMENT: A 69 year old [name] with history of coronary artery disease, status post stent x 2 in proximal right coronary artery along with history of hypertension, type 2 diabetes, morbid obesity, comes in with 1.  Exertional shortness of breath, chest discomfort and hemoglobin of 4.9.  The patient tested Hemoccult-positive in the Emergency Room. It appears the patient is having a slow gastrointestinal bleed. His hemoglobin last year in July 2013 was 11.5. We will place the patient on telemetry floor, give 2 units of blood transfusion, cycle cardiac enzymes x 3. The patient is currently chest pain-free. We will give 2 units of blood transfusion, hold off on aspirin and Plavix. The case was discussed with Dr. Gustavo Lah, who plans doing an endoscopy tomorrow. Will continue Protonix IV b.i.d.  2.  Coronary artery disease, status post stent x 2 in proximal right coronary artery, most recent one placed by Dr. Saralyn Pilar in July 2013 with a drug-eluting stent. The patient had been on aspirin and Plavix for 1 whole year. We will cycle cardiac enzymes x 3. Continue  the rest of the cardiac medications. Case was discussed with Dr. Clayborn Bigness, okay for the patient to be off aspirin and Plavix at this time given his gastrointestinal bleed workup. We will give p.r.n. nitroglycerin as needed.  3.  We will monitor the patient on telemetry.  4.  Hyperlipidemia. Continue simvastatin.  5.  Type 2 diabetes. We will allow patient to continue his home dose insulin according to his sliding-scale.  6.  Gastroesophageal reflux disease.  The patient already is on b.i.d. proton pump inhibitor.  7.  Deep vein thrombosis prophylaxis, thromboembolic disease stockings and sequential compression devices.  8.  Further workup under the patient's clinical course.   Hospital admission plan was discussed with the patient and the patient's family members.   TIME SPENT: 55 minutes.    ____________________________ Hart Rochester Posey Pronto, MD sap:cs D: 10/18/2012 19:28:37 ET T: 10/18/2012 20:06:09 ET JOB#: [id number]  cc: Riann Oman A. Posey Pronto, MD, <Dictator> Derinda Late, MD Manya Silvas, MD Isaias Cowman, MD    Ilda Basset MD ELECTRONICALLY SIGNED 10/26/2012 14:12

## 2014-05-19 NOTE — Discharge Summary (Signed)
PATIENT NAME:  [name], [name] MR#:  [id number] DATE OF BIRTH:  1945-07-19  DATE OF ADMISSION:  10/18/2012 DATE OF DISCHARGE:  10/21/2012  PRESENTING COMPLAINT: Chest pain and shortness of breath.   DISCHARGE DIAGNOSES:  1.  Acute on chronic anemia secondary to suspected slow gastrointestinal bleed from severe erosive gastritis.  2.  Coronary artery disease, status post stent in the past.  3.  Hypertension.  4.  Uncontrolled type 2 diabetes.  5.  Thrombocytopenia. Workup as outpatient.   PROCEDURES: Endoscopy EGD done by Dr. Gustavo Lah showed erosive gastritis. Normal esophagus. A single medium sized papule with no bleeding or stigmata of recent bleeding was found in the stomach.   CODDE STATUS: Full code.   CARDIOLOGY CONSULTATION: Dr. Clayborn Bigness.  GASTROINTESTINAL CONSULTATION: Dr. Gustavo Lah.   MEDICATIONS:  1.  Simvastatin 40 mg at bedtime.  2.  Metoprolol 25 mg b.i.d.  3.  Flomax 0.4 mg p.o. daily.  4.  Lasix 40 mg b.i.d.  5.  Triamcinolone 0.025 topical apply to affected areas b.i.d. as needed.  6.  Humulin R recombinant 500 units per mL take subcutaneous 3 times a day per your sliding scale.  7.  Protonix 40 mg b.i.d.   MEDICATIONS HELD: Aspirin and Plavix. The patient was advised not to take them until further notified by GI.  FOLLOWUP: With Dr. Inez Pilgrim as outpatient for workup on thrombocytopenia and anemia.  Dr. Saralyn Pilar for CAD and hypertension and follow up with Dr. Gustavo Lah in 2 to 4 weeks for repeat endoscopy secondary to erosive gastritis.   LABS AT DISCHARGE: Hemoglobin is 8.1, platelet count is 93,071mwhite count is 5.5. PT-INR is 15.7 and 1.2. Creatinine is 1.84, sodium is 137. Hemoglobin at admission was 4.9. Ultrasound of the abdomen shows hepatosplenomegaly. Status post cholecystectomy. Signs of hepatic steatosis was noted.   BRIEF SUMMARY OF HOSPITAL COURSE: [name] a 69year old Caucasian gentleman with history with of CAD, hypertension and type 2  diabetes, who comes in to the Emergency Room with shortness of breath and was found to have hemoglobin of 4.9.  1.  He was admitted with symptomatic anemia in the form of exertional shortness of breath. It appears to be due to slow gradual blood loss in the stool. The patient was placed on the telemetry floor. He was started on IV Protonix b.i.d. GI consultation was done by Dr. SGustavo Lah The patient underwent EGD, which showed severe erosive gastritis. PPI twice a day was recommended. The patient will be off Plavix and aspirin for at least  4 to 5 weeks and he will need repeat endoscopy as outpatient. The patient advised not to take aspirin or Plavix until further instructions by Dr. SGustavo Lah He received 4 units of blood transfusion. Hemoglobin came up from 4.9 to 8.1.  2.  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 The patient was seen by Dr. BRichardson Landryin ENT. Flexible laryngoscopy remained within normal limits.  3.  Coronary artery disease, status post stent x 2 in right carotid artery, most recent one placed by Dr. PSaralyn PilarJuly 2013 with a drug-eluting stent. The patient had been on Plavix and aspirin for 1 year. He had mild elevated troponin, which appeared to be demand ischemia. He was patent. The patient was seen by Dr. CClayborn Bigness who did not recommend any cardiac workup at this time. In fact, recommended the patient get GI workup. The patient has been off aspirin and Plavix for now.  4.  Hyperlipidemia, on simvastatin.  5.  Type 2 diabetes. Resumed  his recombinant U 500 insulin while in-house. The patient was allowed to give insulin by himself.  6.  GERD, on b.i.d. proton pump inhibitor.  7.  New onset thrombocytopenia with anemia. The patient's workup was initiated by Dr. Inez Pilgrim and he will be followed as outpatient. Ultrasound of the abdomen showed hepatosplenomegaly with hepatic steatosis. Hospital stay remained stable. The patient remained a full code.   TIME SPENT: 40 minutes.     ____________________________ Hart Rochester Posey Pronto, MD sap:aw D: 10/22/2012 07:39:26 ET T: 10/22/2012 08:04:13 ET JOB#: [id number]  cc: Oliver Heitzenrater A. Posey Pronto, MD, <Dictator> Derinda Late, MD Dwayne D. Clayborn Bigness, MD Lollie Sails, MD Simonne Come Inez Pilgrim, MD Ilda Basset MD ELECTRONICALLY SIGNED 10/26/2012 14:13

## 2014-05-20 NOTE — Op Note (Signed)
PATIENT NAME:  [name], BORCHERDING MR#:  [id number] DATE OF BIRTH:  [birth year]  DATE OF PROCEDURE:  08/10/2013  SURGEON: Louis Matte, M.D.   ASSISTANT: Percell Belt, PA-S, (physician's assistant student).   PREOPERATIVE DIAGNOSIS: Lung cancer.   POSTOPERATIVE DIAGNOSIS: Lung cancer.   OPERATION PERFORMED: Insertion of right internal jugular Port-A-Cath using ultrasound guidance.   INDICATIONS FOR PROCEDURE: [name] is a 69 year old gentleman with a recent diagnosis of a left upper lobe, small cell carcinoma. He was not a surgical candidate and was scheduled to begin radiation therapy and chemotherapy. The Port-A-Cath was required for long-term access. The indications and risks were explained to the patient who gave his informed consent.   DESCRIPTION OF PROCEDURE: The patient was brought to the operating suite and placed in the supine position. Laryngeal mask airway anesthesia was established. The patient was turned with his head to the left side and the patient was prepped and draped in the usual sterile fashion. Using ultrasound guidance, the right internal jugular vein was percutaneously catheterized. A wire was placed on the right side of the heart. A port site was then created on the anterior chest wall. A subcutaneous port site was created using electrocautery and blunt dissection. The catheter was then tunneled towards the apex of the incision and brought out through the neck wound.   Through a peel-away sheath, the catheter was then inserted into the right internal jugular vein and advanced into the venous system. Fluoroscopy was carried out. The catheter was in too far and it was pulled back to the junction of the superior vena cava, right atrium. The catheter irrigated and flushed nicely and was then assembled. The port was then placed in the subcutaneous pocket.  It was again irrigated and flushed. It was secured with 3 interrupted Prolene sutures at 3 of the 4 quadrants. The  wounds were then irrigated and then closed. Vicryl was used on the subcutaneous tissues and nylon on the skin. The patient tolerated the procedure well and was taken to the recovery room in stable condition.   ____________________________ Lew Dawes Genevive Bi, MD teo:ds D: 08/10/2013 17:26:00 ET T: 08/10/2013 19:08:26 ET JOB#: [id number]  cc: Christia Reading E. Genevive Bi, MD, <Dictator> Louis Matte MD ELECTRONICALLY SIGNED 08/11/2013 12:19

## 2014-05-20 NOTE — Consult Note (Signed)
Reason for Visit: This 69 year old Male patient presents to the clinic for initial evaluation of  small cell lung cancer .   Referred by Dr.Gitten.  Diagnosis:  Chief Complaint/Diagnosis   69 year old male with stage IIIB (T2, N3, M0) small cell lung cancer for concurrent chemoradiation with curative intent. Patient has limited stage disease also has history of iron deficiency anemia and cirrhosis of the liver with fatty infiltration and portal hypertension  Pathology Report pathology report reviewed   Imaging Report CT scan PET CT scan reviewed   Referral Report clinical notes reviewed   Planned Treatment Regimen concurrent chemoradiation   HPI   patient is a pleasant 69 year old male who is being followed by portal hypertension and cirrhosis with iron deficiency anemia and AV malformations. He was sent for low-dose CT lung screening and was found to have left upper lobe primary bronchogenic carcinoma with nodal metastasis in left suprahilar station. Also small right pleural effusion. Fine-needle aspirateby CT guidance was performedpositive for small cell undifferentiated carcinoma. Disease was confined to the chest on PET CT scan. Brain scan showed no evidence of metastatic disease. Patient has been started on chemotherapy and is now referred to radiation oncology for consideration of concurrent treatment. He does have significant back pain making lying on his table flat difficult. He specifically denies cough hemoptysis or chest tightness. He has significant comorbidities including sleep apnea hepatitis B. and C. iron deficiency anemia.  Past Hx:    pleural effusion:    CHF:    COPD:    diabetic:    hyperlipidemia:    HTN:    exertional chest pain:    detach retina repair:    Laminectomy:    Cataract Extraction both eyes:    fistula repair anal:    Stent - Cardiac:   Past, Family and Social History:  Past Medical History positive   Cardiovascular cardiac  catheterization; congestive heart failure; coronary artery disease; coronary artery stents; hyperlipidemia; hypertension; exertional chest pain   Respiratory COPD; pleural effusion   Endocrine diabetes mellitus   Past Surgical History detached retina repair, cataract repair bilaterally, anal fistula repairhistory laminectomy   Family History noncontributory   Social History positive   Social History Comments greater than 40-pack-year smoking history no EtOH abuse history   Additional Past Medical and Surgical History accompanied by his wife today   Allergies:   No Known Allergies:   Home Meds:  Home Medications: Medication Instructions Status  acetaminophen-HYDROcodone 325 mg-5 mg oral tablet 1 tab(s) orally once a day, As Needed before treatment Active  promethazine 25 mg oral tablet 1 tab(s) orally every 8 hours, As Needed - for Nausea, Vomiting, #12 Active  ferrous fumarate-iron polysaccharide 162 mg-115.2 mg oral capsule 1 cap(s) orally once a day Active  Generlac 10 g/15 mL oral and rectal liquid 30 milliliter(s) oral and rectal once a day, As Needed Active  Centrum Adults Therapeutic Multiple Vitamins with Minerals oral tablet 1 tab(s) orally once a day Active  allopurinol 300 mg oral tablet 1 tab(s) orally once a day Active  Carafate 1 g oral tablet 1 tab(s) orally 2 times a day (before meals) Active  PriLOSEC OTC 20 mg oral delayed release tablet 1 tab(s) orally once a day Active  HumaLOG 100 units/mL subcutaneous solution 1 milliliter(s) subcutaneous once a day-insulin pump Active  nitroglycerin 0.3 mg sublingual tablet 1 tab(s) sublingual every 5 minutes, As Needed - for Chest Pain Active  aspirin 81 mg oral tablet 1 tab(s) orally  once a day   Active  Plavix 75 mg oral tablet 1 tab(s) orally once a day   Active  simvastatin 40 mg oral tablet 1 tab(s) orally once a day (at bedtime) Active  metoprolol tartrate 25 mg oral tablet 1 tab(s) orally 2 times a day Active  Flomax  0.4 mg oral capsule 1 cap(s) orally once a day Active  furosemide 40 mg oral tablet 1 tab(s) orally 2 times a day Active   Review of Systems:  General negative   Performance Status (ECOG) 0   Skin negative   Breast negative   Ophthalmologic negative   ENMT negative   Respiratory and Thorax see HPI   Cardiovascular see HPI   Gastrointestinal negative   Genitourinary negative   Musculoskeletal negative   Neurological negative   Psychiatric negative   Hematology/Lymphatics negative   Endocrine negative   Allergic/Immunologic negative   Review of Systems   multiple significant problems with low back pain, insulin-dependent adult-onset diabetes sleep apnea problems with iron deficiency anemiamultiple colonic polyps. Otherwise review of systems obtained from nurses notes.  Nursing Notes:  Nursing Vital Signs and Chemo Nursing Nursing Notes: *CC Vital Signs Flowsheet:   04-Aug-15 10:09  Temp Temperature 98  Pulse Pulse 64  Respirations Respirations 20  SBP SBP 167  DBP DBP 77  Pain Scale (0-10)  0  Current Weight (kg) (kg) 149  Height (cm) centimeters 177.8  BSA (m2) 2.5   Physical Exam:  General/Skin/HEENT:  Skin normal   Eyes normal   ENMT normal   Head and Neck normal   Additional PE well-developed morbidly obese male in NAD. Lungs are clear to A&P cardiac examination shows regular rate and rhythm. No cervical or supraclavicular adenopathy is appreciated. He does have 2/6 lower extremity edema abdomen is obese benign. He is wearing an insulin pump.   Breasts/Resp/CV/GI/GU:  Respiratory and Thorax normal   Cardiovascular normal   Gastrointestinal normal   Genitourinary normal   MS/Neuro/Psych/Lymph:  Musculoskeletal normal   Neurological normal   Lymphatics normal   Other Results:  Radiology Results: LabUnknown:    25-Jun-15 11:58, CT Chest and Abd With Contrast  PACS Image     13-Jul-15 12:30, Bone Scan Whole Body (Part 2 of 2)   PACS Image     23-Jul-15 09:12, CT Head Without Contrast  PACS Image   CT:    25-Jun-15 11:58, CT Chest and Abd With Contrast  CT Chest and Abd With Contrast   REASON FOR EXAM:    LABS 1ST Lung nodule w areas of suspicious   lyphadenopathy PT FOR PFT AFTER  COMMENTS:       PROCEDURE: CT  - CT CHEST AND ABDOMEN W  - Jul 21 2013 11:58AM     CLINICAL DATA:  Followup of screening CT demonstrating a suspicious  lung nodule. No history of primary malignancy.    EXAM:  CT CHEST AND ABDOMEN WITH CONTRAST    TECHNIQUE:  Multidetector CT imaging of the chest and abdomen was performed  during bolus administration of intravenous contrast.  CONTRAST:  85 cc Isovue 370    COMPARISON:  Screening CT of the chest of 07/18/2013. Abdominal  pelvic CT of 05/18/2008.    FINDINGS:  CT CHEST FINDINGS    Lungs/Pleura: Calcified right upper lobe granuloma on image 30.    Dependent atelectasis in the right lower lobe is similar.    Macro lobulated left upper lobe pulmonary nodule measures 2.2 x 1.4  cm on  image 21. Satellite nodules, including a 7 mm immediately  posterior lateral nodule on image 22/series 4.  Small right-sided pleural effusion is not significantly changed.    Heart/Mediastinum: No supraclavicular adenopathy. Bilateral  gynecomastia, slightly greater on the left. Aortic atherosclerosis.  Mild cardiomegaly. Multivessel coronary artery atherosclerosis. No  central pulmonary embolism, on this non-dedicated study. 1.3 cm  precarinal node on image 24/series 2. Increased number of small  subcarinal nodes.    Left suprahilar adenopathy. This measures 2.8 x 2.1 cm on image  25/series 2. Fluid level in the thoracic esophagus on image 16.    Upper normal right cardiophrenic angle node at 7 mm on image 47.    CT ABDOMEN FINDINGS  Abdomen: Moderate cirrhosis. Hepatomegaly, 23.3 cm craniocaudal.  Splenomegaly, with the spleen measuring 18.9 cm craniocaudal.    Small volume  perihepatic and perisplenic ascites. Normal stomach.  Descending duodenal diverticulum. Normal pancreas. Cholecystectomy,  without biliary ductal dilatation. Patent portal veins. Hepatic  veins not well evaluated.    Normal adrenal glands.  Mild bilateral renal cortical thinning.    Aortic and branch vessel atherosclerosis. Increased number of small  retroperitoneal nodes. Portacaval node measures 1.9 cm and is  favored to be reactive.    Normal abdominal large and small bowel loops.  Bones/Musculoskeletal: Thoracolumbar spondylosis.     IMPRESSION:  CT CHEST IMPRESSION    1. Left upper lobe primary bronchogenic carcinoma with nodal  metastasis in the left suprahilar station.  2. Small mediastinal nodes with mild precarinal adenopathy.  Indeterminate. Consider further evaluation with PET to direct tissue  sampling.  3. Small right pleural effusion is similar.  4. Esophageal air fluid level suggests dysmotility or  gastroesophageal reflux.  5.  Atherosclerosis, including within the coronary arteries.  6. Bilateral gynecomastia.  7. Esophageal air fluid level suggests dysmotility or  gastroesophageal reflux.    CT ABDOMEN  IMPRESSION    1. Moderate cirrhosis with hepatosplenomegaly. Suspect portal veous  hypertension.  2.  No evidence of metastatic disease in the abdomen.  3. Small volume abdominal ascites.  4. Upper abdominal/porta hepatis adenopathy, favored to be reactive.      Electronically Signed    By: Abigail Miyamoto M.D.    On: 07/21/2013 14:06     Verified By: Areta Haber, M.D.,    23-Jul-15 09:12, CT Head Without Contrast  CT Head Without Contrast   REASON FOR EXAM:    staging lung CA    renal failure  COMMENTS:       PROCEDURE: CT  - CT HEAD WITHOUT CONTRAST  - Aug 18 2013  9:12AM     CLINICAL DATA:  Left upper lobe small cell carcinoma. No headaches.  Staging for lung cancer.    EXAM:  CT HEADWITHOUT CONTRAST    TECHNIQUE:  Contiguous axial images  were obtained from the base of the skull  through the vertex without intravenous contrast.  COMPARISON:  None.    FINDINGS:  There is no evidence of mass effect, midline shift or extra-axial  fluid collections. There is no evidence of a space-occupying lesion  or intracranial hemorrhage. There is no evidence of a cortical-based  area of acute infarction. There is mild generalized cerebral and  cerebellar atrophy.    The ventricles and sulci are appropriate for the patient's age. The  basal cisterns are patent.    Visualized portions of the orbits are unremarkable. There is mild  mucosal thickening of the left maxillary sinus.  The  osseous structures are unremarkable.     IMPRESSION:  1. No acute intracranial pathology. No evidence of intracranial  metastatic disease, but evaluation is limited secondary to lack of  intravenous contrast. If there is persisting clinical concern,  further evaluation with MRI of the brain without and with  intravenous contrast is recommended.      Electronically Signed    By: Kathreen Devoid    On: 08/18/2013 09:16       Verified By: Jennette Banker, M.D., MD  Nuclear Med:    13-Jul-15 12:30, Bone Scan Whole Body (Part 2 of 2)  Bone Scan Whole Body (Part 2 of 2)   REASON FOR EXAM:    staging small cell lung CA  COMMENTS:       PROCEDURE: NM  - NM BONE WB 3 HR 2 OF 2  - Aug 08 2013 12:30PM     CLINICAL DATA:  Small-cell lung cancer staging.    EXAM:  NUCLEAR MEDICINE WHOLE BODY BONE SCAN    TECHNIQUE:  Whole body anterior and posterior images were obtained approximately  3 hours after intravenous injection of radiopharmaceutical.    RADIOPHARMACEUTICALS:  23.3 mCi Technetium-99 MDP  COMPARISON:  Chest CT 08/02/2013.  Chest and abdomen CT 07/21/2013.    FINDINGS:  There is mildly asymmetric uptake near the left sternoclavicular  articulation. Uptake elsewhere in the axial skeleton is relatively  symmetric without focal abnormality  identified. Uptake involving the  left greater than right knees, ankles, and feet is likely  degenerative.     IMPRESSION:  Mildly asymmetric uptake near the left sternoclavicular joint, most  likely degenerative. No other evidence of osseous metastatic  disease.    Electronically Signed    By: Logan Bores    On: 08/08/2013 14:10         Verified By: Ferol Luz, M.D.,   Relevent Results:   Relevant Scans and Labs CT scans PET CT scan and bone scan are all reviewed compatible with the above-stated findings.   Assessment and Plan: Impression:   Limited stage small cell lung cancer in 69 year old male with multiple medical comorbidities. Plan:   at this time I recommend concurrent chemoradiation. Would plan on delivering a large field of 4000 cGy over 4 weeks and then evaluate for response and possible small field boost to bring total dose to his primary bulky tumor to 6000 cGy. Risks and benefits of treatment including loss of normal lung volume, exposure of his heart, dysphasia secoy to radiation esophagitis, possible alteration of blood counts, skin reaction all were explained in detail to the patient.I set the patient up for CT simulation later this week. Will coordinate chemotherapy with medical oncology. Should the patient have an achieve a complete response of also discussed whole brain radiation therapy to eradicate microscopic residual disease.  I would like to take this opportunity for allowing me to participate in the care of your patient..  Fax to Physician:  Physicians To Recieve Fax: Isaias Cowman, MD - [phone] Manya Silvas - [phone].  Electronic Signatures: Armstead Peaks (MD)  (Signed 04-Aug-15 11:59)  Authored: HPI, Diagnosis, Past Hx, PFSH, Allergies, Home Meds, ROS, Nursing Notes, Physical Exam, Other Results, Relevent Results, Encounter Assessment and Plan, Fax to Physician   Last Updated: 04-Aug-15 11:59 by Armstead Peaks (MD)

## 2014-05-21 NOTE — Discharge Summary (Signed)
PATIENT NAME:  [name], [name] MR#:  [id number] DATE OF BIRTH:  August 17, 1945  DATE OF ADMISSION:  07/30/2011 DATE OF DISCHARGE:  07/31/2011  PRIMARY CARE PHYSICIAN: Dr. Arline Asp   FINAL DIAGNOSES: 1. Coronary artery disease.  2. Hypertension.  3. Hyperlipidemia.  4. Insulin-dependent diabetes.  5. Chronic obstructive pulmonary disease.   MEDICATIONS ON DISCHARGE:  1. Aspirin 81 mg daily.  2. Plavix 75 mg daily.  3. Hydrochlorothiazide 25 mg daily.  4. Simvastatin 40 mg at bedtime.  5. Metoprolol tartrate 25 mg b.i.d.  6. Furosemide 40 mg daily.  7. Losartan 100 mg daily.  8. Flomax 0.4 mg daily.  9. Humulin N 50 units b.i.d.  10. Triamcinolone cream.  11. Omeprazole 20 mg daily.   HISTORY OF PRESENT ILLNESS: Please see admission history and physical.   HOSPITAL COURSE: The patient underwent elective cardiac catheterization on 07/29/2011. Coronary arteriography revealed high-grade in-stent restenosis in the stent in the proximal right coronary artery. The patient underwent percutaneous coronary intervention receiving a 2.5 x 20 mm PROMUS drug-eluting stent with an excellent angiographic result. Patient was returned to the Critical Care Unit where he had an uncomplicated hospital course.     On the morning of 07/31/2011 the patient was discharged home for follow up in one week.   ____________________________ Isaias Cowman, MD ap:cms D: 07/30/2011 13:39:00 ET T: 07/30/2011 16:01:35 ET JOB#: [id number]  cc: Isaias Cowman, MD, <Dictator> Isaias Cowman MD ELECTRONICALLY SIGNED 08/07/2011 8:37

## 2014-05-28 NOTE — Discharge Summary (Signed)
PATIENT NAME:  [name], [name] MR#:  [id number] DATE OF BIRTH:  [birth year]  DATE OF ADMISSION:  03/24/2014 DATE OF DISCHARGE:  03/25/2014  ADMISSION DIAGNOSIS: Chest pain.   DISCHARGE DIAGNOSES:  1.  Non-Q-wave myocardial infarction.  2.  Pancytopenia.  3.  Chronic renal failure.  4.  Elevated transaminases.  5.  History of coronary artery disease, chronic diastolic congestive heart failure, stable.  6.  Hypertension.  7.  Hyperlipidemia, not otherwise specified  8.  Small cell lung carcinoma confined to lung.  9.  Liver cirrhosis.  10.  Ascites.  11.  Portal hypertension.  12.  Splenomegaly.   13.  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  14.  Arteriovenous malformation.  15.  Gastrointestinal bleed.  15.  Diabetes mellitus, insulin-dependent.   DISCHARGE CONDITION: Stable.   DISCHARGE MEDICATIONS: The patient is to continue metoprolol tartrate 25 mg p.o. twice daily, Flomax 0.4 mg once daily, Centrum multivitamins with minerals 1 tablet daily, promethazine 25 mg every 8 hours as needed, Carafate 1 gram twice daily before meals, Humalog insulin pump, nitroglycerin 0.3 mg sublingual tablet every 5 minutes as needed, Plavix 75 mg p.o. daily, iron fumarate with iron polysaccharide 162/115 one capsule once daily, furosemide 40 mg p.o. daily, MiraLax 17 grams daily as needed, dexamethasone 4 mg every second day, lactulose 30 mL twice weekly, Minitran 0.4 mg/hour transdermal film 1 patch daily, allopurinol 100 mg p.o. daily (this is a new dose), pantoprazole 40 mg p.o. once daily (this is a new medication, it changed Prilosec), Crestor 10 mg p.o. at bedtime. The patient is not to take Prilosec due to interaction with Plavix.   SERVICES: Home health physical therapy.   DIET: 2 grams salt, low-fat, low-cholesterol, carbohydrate -controlled diet.   ACTIVITY LIMITATIONS: As tolerated.    FOLLOWUP APPOINTMENT: With Dr. Baldemar Lenis in 2 days after discharge, Dr. Saralyn Pilar in 1 week after discharge,  Dr. Inez Pilgrim in 1 week after discharge.   INSTRUCTIONS: The patient was advised not to take allopurinol 300 mg pill, as it was felt to be too high for his kidney function and can cause pancytopenia.    CONSULTANTS: Care management, social work, Dr. Ubaldo Glassing.   RADIOLOGIC STUDIES: Chest x-ray, PA and lateral, 03/22/2014, showing COPD changes with probable postradiation therapy changes involving the medial and perihilar regions of left upper lobe but no new abnormalities. Portable chest x-ray, 03/24/2014, revealed no acute cardiopulmonary disease. VQ scan, 03/25/2014, revealed a small match defect corresponding to chronic left upper lobe process seen on prior imaging, low probability study for pulmonary embolus.   HOSPITAL COURSE: The patient is a 69 year old male with history of lung cancer who presents to the hospital as a direct admit on 03/24/2014 from Dr. Saralyn Pilar' office due to chest pains. Please refer to Dr. Keenan Bachelor admission note on 03/24/2014. On arrival to the hospital, the patient's temperature was 97.7, pulse was 54, respiratory rate was 20, blood pressure 138/72, saturation was 100% on room air. Physical exam was unremarkable. The patient's lab data done on arrival to the hospital revealed an elevated glucose level of 141, BUN and creatinine were 61 and 1.79, sodium 135, otherwise BMP was unremarkable. The patient's liver enzymes revealed albumin level of 2.9, AST as well as ALT 59 and 120. Cardiac enzymes showed CK level of 151, MB fraction was 11.0, and troponin was 0.79 on the first set, CK total was 46, MB fraction of 7.2, and troponin was 0.71 on the second set, and CK level was 47, MB fraction  of 6.7, and troponin was 0.72 on the third set. The patient's white blood cell count was low at 3.4, hemoglobin was 9.9, and platelet count was 42,000. Absolute neutrophil count was normal at 3.0. Coagulation panel: Pro time 15.4, INR 1.2. The patient was admitted to the hospital for further evaluation.  He was consulted by cardiologist and cardiac enzymes were cycled x 3. He was continued on Plavix, nitroglycerin, heparin subcutaneously, and metoprolol. Dr. Ubaldo Glassing who saw the patient in consultation and felt that the patient had on and off going pains and elevated troponin, which he felt that very likely is due to demand ischemia. He recommended no invasive evaluation due to comorbid conditions such as bleeding risks with any anticoagulation or more agressive antiplatelet therapy. He felt that the patient is stable and pain-free with nitroglycerin and recommended to continue Plavix, beta blockers, topical nitrates, and statin. The patient was initiated on the nitroglycerin patch and statin and recommended to follow up with Dr. Saralyn Pilar.   He was seen also by palliative care and recommended to follow up with Dr. Inez Pilgrim in regard to his pancytopenia. He was recommended to have his allopurinol dose decreased to 100 mg daily due to his chronic renal failure.   In regard to chronic renal failure, the patient's kidney function remained stable during his stay in the hospital time.   In regard to elevated transaminases, it is known that the patient has liver cirrhosis and the  transaminase level remained stable.   The patient is being discharged in stable condition with the above mentioned medications and followup. He was seen by physical therapist and recommended home health physical therapy.   On the day of discharge, temperature was 97.5, pulse was 54, respiration rate was 19, blood pressure 106/67, saturation was 100% on room air at rest.   TIME SPENT: 40 minutes.    ____________________________ Theodoro Grist, MD rv:bm D: 03/25/2014 17:01:01 ET T: 03/26/2014 00:08:44 ET JOB#: [id number]  cc: Theodoro Grist, MD, <Dictator> Derinda Late, MD Isaias Cowman, MD Simonne Come. Inez Pilgrim, MD  Theodoro Grist MD ELECTRONICALLY SIGNED 04/12/2014 13:32

## 2014-05-28 NOTE — Consult Note (Signed)
PATIENT NAME:  [name], [name] MR#:  [id number] DATE OF BIRTH:  January 14, 1946  DATE OF CONSULTATION:  03/25/2014  REFERRING PHYSICIAN:  Theodoro Grist, MD CONSULTING PHYSICIAN:  Melissa C. Mike Gip, MD  REASON FOR CONSULTATION: Small-cell lung cancer and pancytopenia.  CHIEF COMPLAINT: The patient is a 69 year old gentleman with a history of small-cell lung cancer, who was admitted on 03/24/2014 secondary to a non-Q wave myocardial infarction.  HISTORY OF PRESENT ILLNESS:  The patient has a complicated past medical history. He has known hepatosplenomegaly, attributed to fatty infiltration of the liver. He has cirrhosis and portal hypertension. He has a history of anemia status post GI bleed with AV malformations. 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 He has renal insufficiency, hypertension, COPD and sleep apnea.  The patient was diagnosed with a limited stage small-cell lung cancer. He received 4 cycles of carboplatin and etoposide from 08/24/2013 until 11/07/2013. Review of his pre-treatment labs notes that he had a baseline low platelet count, approximately 100,000. Followup PET scan on 01/25/2014 revealed a complete remission. 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 He then received prophylactic cranial radiation from 01/28 until 03/16/2014. According to the patient, he has been on a tapering dose of steroids per Dr. Baruch Gouty of radiation oncology.   The patient states that presented with chest pain and slight pain in his left arm. He denied any nausea or vomiting. Chest pain was not provoked by any activity. He currently does not smoke. He has been off his cholesterol medicine for some time.   The patient denies any new medications. He denies any herbal products. His diet is good.   PAST MEDICAL HISTORY: Limited stage small-cell lung cancer, splenomegaly, fatty infiltration of the liver, portal hypertension, liver  cirrhosis, anemia status post GI bleed, iron deficiency, AV malformations, hypertension, COPD, coronary artery disease, sleep apnea, diabetes.  PAST SURGICAL HISTORY: Cardiac stent placement.  FAMILY HISTORY: Noncontributory.  SOCIAL HISTORY: The patient previously smoked. He does not drink alcohol.   MEDICATIONS: Current medications include furosemide, metoprolol, nitroglycerin, pantoprazole, Carafate, Flomax, vitamin B complex with intrinsic factor, clopidogrel, Docusate, morphine, MiraLax.  ALLERGIES: No known drug allergies.  REVIEW OF SYSTEMS: CONSTITUTIONAL: No fever, chills or sweats. Weight is stable. EYES: No change in vision. No diplopia.  ENMT: No mouth sores or tenderness. No sore throat.  RESPIRATORY:  Chronic shortness of breath with exertion. No hemoptysis. CARDIAC: Chest pain as noted in the history of present illness.  GASTROINTESTINAL: No nausea, vomiting, diarrhea, constipation, melena, hematochezia. MUSCULOSKELETAL:  No focal bone or joint pain.  GENITOURINARY: No dysuria or hematuria. ENDOCRINE:  Diabetes. No thyroid disease.  SKIN: No rashes, ulcers. PSYCHIATRIC: No mood changes.  HEMATOLOGIC/LYMPHATIC: No increased bruising or bleeding. No tender or swollen lymph nodes. NEUROLOGIC: No focal weakness or numbness.  PHYSICAL EXAMINATION:  GENERAL:  The patient is lying comfortably on the medical unit in no acute distress.  VITAL SIGNS: Pulse 52, respiratory rate 18, blood pressure 116/53, temperature 98.4, O2 saturation 99%. HEAD: Yehuda Mao, male pattern baldness. Face symmetric. No cushingoid features.  EYES: Pupils are equal, round, and reactive to light and accommodation. No conjunctivitis or scleral icterus.  EAR, NOSE AND THROAT: No oral lesions.  NECK:  Supple without adenopathy. CARDIOVASCULAR: Regular rate and rhythm without murmur, rub or gallops.  LUNGS:  Clear to auscultation anteriorly. ABDOMEN: Fully round, soft, nontender with active bowel  sounds. Organomegaly difficult to assess.  LYMPH NODES: No palpable cervical,  supraclavicular, axillary or inguinal adenopathy.  EXTREMITIES: Chronic lower extremity changes.  NEUROLOGIC:  Appropriate.   DATA REVIEW: CBC today includes hematocrit 28.8, hemoglobin 9.1, platelets 39,000, white count 2,500 with an ANC of 2200. Labs on admission included a hematocrit of 31.9, hemoglobin 9.9, platelets 42,000, white count 3,400 with an [place] of 3000.  Review of his CBCs over the past month note persistent thrombocytopenia, although his platelet count seems to have drifted down since the first part of February as compared to his baseline of approximately 85,000-100,000. Platelets on 03/06/2014 were 85,000. Platelets on 02/03/2014 were 115,000.   ASSESSMENT:  The patient is 69 year old gentleman with a history of limited-stage small cell lung cancer, status post carboplatin and etoposide with consolidative radiation and prophylactic cranial radiation. He has known coronary artery disease and was admitted with a non-Q wave MI.   He has low blood counts, felt secondary to sequestration. He has known cirrhosis and splenomegaly. He denies any new medications or herbal products. His platelet count seems to have drifted down from baseline of 80,000-100,000 in the early part of February 2016. He has some radiation pneumonitis for which he is on steroids. PET scan on 01/25/2014 revealed complete remission.   PLAN: 1.  Followup V/Q scan being ordered for shortness of breath.  2.  Additional workup regarding his pancytopenia today, including B12, folate, TSH, hepatitis B and C serologies. 3.  Agree with decrease allopurinol dosing as this can be a myelosuppressive.  4. If the patient is discharged home, she should follow up with Dr. Inez Pilgrim in the hematology/oncology clinic next week.     ____________________________ Drue Second. Mike Gip, MD mcc:je D: 03/25/2014 12:54:29 ET T: 03/25/2014 14:01:22  ET JOB#: [id number]  cc: Melissa C. Mike Gip, MD, <Dictator> Theodoro Grist, MD Simonne Come. Inez Pilgrim, MD Lequita Asal MD ELECTRONICALLY SIGNED 03/26/2014 2:24

## 2014-05-28 NOTE — H&P (Signed)
PATIENT NAME:  [name], [name] MR#:  [id number] DATE OF BIRTH:  [birth year]  DATE OF ADMISSION:  03/24/2014  PRIMARY CARE PHYSICIAN:  Dr. Baldemar Lenis.    ONCOLOGIST:  Dr. Inez Pilgrim.   HISTORY OF PRESENT ILLNESS:  The patient is a 69 year old Caucasian male with past medical history significant for history of non-small cell lung cancer, history of liver cirrhosis, portal hypertension, history of coronary artery disease, insulin-dependent diabetes mellitus, obstructive sleep apnea, hypertension, who presented to the hospital with complaints of chest pain. Apparently the patient has been having chest pain for the past 2-3 weeks. He was seen by Dr. Saralyn Pilar who felt the patient needs to be directly admitted to the hospital. The patient admits of pain in the mid sternal area of his chest. Pain is described as achiness, constant, increasing whenever he walks around, and accompanied by shortness of breath. On arrival to the hospital the patient was noted to have elevated troponin and admitted to the hospital for further evaluation, for cardiology evaluation.   PAST MEDICAL HISTORY:  Significant for history of non-small cell lung carcinoma status post radiation therapy in the past, history of splenomegaly which was attributed to fatty liver infiltration, also portal hypertension and liver cirrhosis, anemia, status post GI bleed in the past which was felt to be due to chronic gastritis, history of iron deficiency as well as AV malformations for which he required IV Feraheme initiation, also thrombocytopenia which is attributable to splenomegaly, azotemia, hypertension, COPD, coronary artery disease status post stent placement in the past, history of sleep apnea, diabetes mellitus poorly controlled, history of new diagnosis of small cell lung carcinoma in left lung limited stage by CT of chest, abdomen, and bone scan as well as PET scan was done, noncontrast head CT was also performed. The patient received carboplatin as  well as VP-16, 4 cycles started in July 2015, the patient underwent also radiation therapy together with his chemotherapy.    SOCIAL HISTORY:  Negative for alcohol abuse. Former smoker.   History of coronary artery disease status post stent placement in proximal RCA in the past which showed developed high-grade in stent stenosis, underwent drug-eluting stent placement in the proximal RCA by Dr. Saralyn Pilar in July 2013, most recent Myoview in April 2014 was essentially negative.   PAST SURGICAL HISTORY: Fistula repair, laminectomy for ruptured disk, also left cataract surgery, left detached retina in May 2003.   FAMILY HISTORY: The patient's father died of MI at the age of 52. The patient's mother, history of MI.    SOCIAL HISTORY: Lives with at home with his wife. Former smoker. No alcohol abuse.   ALLERGIES. No known drug allergies.   MEDICATIONS: According to medical records the patient is on allopurinol 300 mg p.o. daily, Carafate 1 gram twice daily, centrum multivitamins with minerals 1 daily, dexamethasone 4 mg every 48 hours, iron fumarate with iron polysaccharide 162/112 mg capsule 1 capsule daily, Flomax 0.4 mg daily, furosemide 40 mg p.o. daily, Humalog 1 mL subcutaneously insulin pump, lactulose 30 mL twice weekly, metoprolol tartrate 25 mg twice daily, MiraLax 17 grams once daily as needed, nitroglycerin 0.4 mg sublingually every 5 minutes as needed, Plavix 75 mg p.o. daily, Prilosec OTC 20 mg p.o. daily, promethazine 25 mg every 8 hours as needed.      REVIEW OF SYSTEMS: Positive for chest pains, fatigue and weakness which seem to be chronic, weight loss approximately 70 pounds over the past few months, cataract removal in the past, history of cough with  some minimal phlegm production, also some significant shortness of breath for which he actually has to sit in the recliner and sleep in the recliner.  Admits of constipation for which he takes lactulose, also feeling presyncopal  intermittently during his radiation therapy a few weeks ago. Denies any fevers, chills, weight gain.  EYES: Denies any blurry vision, double vision, glaucoma.   EARS, NOSE, AND THROAT:  Denies tinnitus, allergies, epistaxis, sinus pain, dentures, difficulty swallowing,.   RESPIRATORY:  Denies wheezes, asthma, COPD.   CARDIOVASCULAR: Denies orthopnea, edema, arrhythmias, palpitations, or syncope.  GASTROINTESTINAL: Denies any nausea, vomiting, diarrhea, rectal bleeding, change in bowel habits.  GENITOURINARY: Denies dysuria, hematuria, frequency, incontinence.   ENDOCRINE:  Denies polydipsia, nocturia, thyroid problems, heat or cold intolerance, or thirst.  HEMATOLOGIC: Denies anemia, easy bruising or bleeding, swollen glands.  SKIN: Denies any acne, rashes, change in moles.  MUSCULOSKELETAL: Denies arthritis, cramps, swelling.  NEUROLOGIC: Denies numbness, epilepsy, or tremor.  PSYCHIATRIC: Denies anxiety, insomnia, or depression.   PHYSICAL EXAMINATION:  VITAL SIGNS: On arrival to the hospital the patient's vital signs, temperature of 97.7, pulse was 54, respiration rate was 20, blood pressure 138/72, saturation was 100% on room air.  GENERAL: This is a well-developed, well-nourished, obese Caucasian male sitting on the stretcher. He is able to eat and drink fluids.   HEENT: His pupils are equal and reactive to light. Extraocular muscles intact. No icterus or conjunctivitis. Has normal hearing. No pharyngeal erythema. Mucosa is moist.  NECK: No masses. Supple, nontender. Thyroid is not enlarged. No adenopathy. No JVD or carotid bruits bilaterally. Full range of motion.  LUNGS: Clear to auscultation, diminished breath sounds. No rales, rhonchi, or wheezing.  No labored inspirations, increased effort, dullness to percussion, or overt respiratory distress.  HEART: S1, S2 appreciated.  Rhythm is regular. PMI not lateralized. Chest is nontender to palpation.  1 + pedal pulses.  The patient does have  right-sided Port-A-Cath placed. No lower extremity edema, calf tenderness, or cyanosis was noted.  ABDOMEN: Soft, nontender. Bowel sounds are present. No hepatosplenomegaly or masses were noted.  RECTAL: Deferred.  MUSCLE STRENGTH: Able to move all extremities. No cyanosis, degenerative joint disease, or kyphosis.  Gait was not tested.   SKIN: Did not reveal any rashes, lesions, erythema, nodularity, or induration. It was warm and dry to palpation.  LYMPHATIC:  No lymphadenopathy in the cervical region.  NEUROLOGIC: Cranial nerves grossly intact. Sensory is intact. No dysarthria or aphasia. The patient is alert, oriented to time, person and place, cooperative. Memory is good. No sign of confusion, agitation, or depression.   EKG showed sinus bradycardia at 55 beats per minute, right bundle branch block, septal infarct age indeterminate, left lateral infarct age indeterminate, T wave abnormality, consider inferior ischemia according to EKG criteria, as compared to prior EKG, ST depressions are notable in V3, V4 which were not seen in the past, which are new since July 2015.     LABORATORY DATA:  BMP, BUN and creatinine were 61 and 1.79, glucose 141, sodium 135, otherwise BMP unremarkable. Liver enzymes, albumin level of 2.9, AST and ALT 59/120.  CK total 51, MB fraction 11.0. Troponin 0.79.  White blood cell count 3.4, hemoglobin 9.9, platelet count was 42,000, absolute neutrophil count is normal at 3.0. The patient's pro time is 15.4, INR 1.2.    ASSESSMENT AND PLAN:  1.  Non-Q-wave myocardial infarction.  Admit the patient to medical floor. Continue him on beta blockers, Plavix, nitroglycerin, as well as heparin.  Follow the patient's cardiac enzymes x 3, get cardiologist involved for further recommendations.  2.  Renal failure, chronic. Follow with therapy.  3.  Elevated transaminases, seem to be stable since prior tests.  4.  Pancytopenia. Get oncology involved for further recommendations.  5.   History of gastrointestinal bleed. Continue the patient on PPI, Protonix as opposed to omeprazole or Prilosec which was given in the past to ensure that there is no interaction between Plavix and PPI.     ____________________________ Theodoro Grist, MD rv:bu D: 03/24/2014 17:17:20 ET T: 03/24/2014 18:52:23 ET JOB#: [id number]  cc: Theodoro Grist, MD, <Dictator> Cameron Late, MD [place] MD ELECTRONICALLY SIGNED 04/12/2014 14:30

## 2014-05-28 NOTE — Consult Note (Signed)
   Present Illness 69 yo male with history of cad, chronic diastolic chf, hypertension and hyperlipidemia. He has a Xience DES of rca placed in 2012 and promus des in [place] in 2013. He also has small cell lung ca with lymph node mets and s/p chemotherapy and radiation. Has history of liver cirrhosis and ascites. Aditted after develping weight loss  and intermitant chet pain and dyspnea at rest.  He has history of avms , splenomegaly and thrombocytopenia.  His serum troponin was 0.79 . His chest pain has imipoved since admission. He is currently hemodynamically stable. EKG shows sinus bradycardia with rbbb with no injury current. cxr unremarkable for edema or respiratory compromise   Physical Exam:  GEN no acute distress   HEENT PERRL   NECK No masses   RESP normal resp effort   CARD Bradycardic  Normal, S1, S2  Murmur   Murmur Systolic   Systolic Murmur axilla   ABD denies tenderness   LYMPH negative neck   EXTR negative cyanosis/clubbing, negative edema   SKIN normal to palpation   NEURO cranial nerves intact, motor/sensory function intact   PSYCH A+O to time, place, person   Review of Systems:  Subjective/Chief Complaint admitted with chest pain and weakness. Improved at present.   General: Fatigue   Skin: No Complaints   ENT: No Complaints   Eyes: No Complaints   Neck: No Complaints   Respiratory: Short of breath   Cardiovascular: Chest pain or discomfort   Gastrointestinal: No Complaints   Genitourinary: No Complaints   Vascular: No Complaints   Musculoskeletal: No Complaints   Neurologic: No Complaints   Hematologic: No Complaints   Endocrine: No Complaints   Psychiatric: No Complaints   Review of Systems: All other systems were reviewed and found to be negative   Medications/Allergies Reviewed Medications/Allergies reviewed   Family & Social History:  Family and Social History:  Family History Non-Contributory   EKG:  EKG NSR   Abnormal  RBBB    No Known Allergies:    Impression 69 yo male with history of cad, chronic diastolic chf, hypertension and hyperlipidemia. He has a Xience DES of rca placed in 2012 and promus des in [place] in 2013. He also has small cell lung ca with lymph node mets and s/p chemotherapy and radiation. Has history of liver cirrhosis and ascites. Aditted after develping weight loss  and intermitant chet pain and dyspnea at rest.  He has history of avms , splenomegaly and thrombocytopenia. Has elevated serum troponin at present likely due to demand ischemia in face of cad. Not a candidate for invasive evaluation due to comorbid conditions and bleeding risk with any anticoagulation or  more agressive antiplatelet therapy. Would agree with palliative therapy. Currenlty stable and pain free. Would continue with current regimen   Plan 1. Continue with clopidigrel for now following platelets, hgb and for evidence of bleeding 2. Continue metoprolol and topical nitrates. 3. Agree with palliative care evaluation 4. Small cell lung ca treatment per oncology   Electronic Signatures: Teodoro Spray (MD)  (Signed 27-Feb-16 09:39)  Authored: General Aspect/Present Illness, History and Physical Exam, Review of System, Family & Social History, EKG , Allergies, Impression/Plan   Last Updated: 27-Feb-16 09:39 by Teodoro Spray (MD)

## 2014-05-30 ENCOUNTER — Other Ambulatory Visit: Payer: Self-pay | Admitting: Family Medicine

## 2014-05-31 ENCOUNTER — Encounter: Payer: Self-pay | Admitting: Internal Medicine

## 2014-05-31 ENCOUNTER — Other Ambulatory Visit: Payer: Self-pay

## 2014-06-06 NOTE — Progress Notes (Signed)
Erroneous encounter This encounter was created in error - please disregard. 

## 2014-06-12 ENCOUNTER — Other Ambulatory Visit: Payer: Self-pay | Admitting: *Deleted

## 2014-06-12 ENCOUNTER — Inpatient Hospital Stay: Payer: Medicare Other

## 2014-06-12 ENCOUNTER — Inpatient Hospital Stay: Payer: Medicare Other | Attending: Internal Medicine | Admitting: Internal Medicine

## 2014-06-12 ENCOUNTER — Ambulatory Visit: Payer: Medicare Other

## 2014-06-12 VITALS — BP 108/70 | HR 71 | Temp 98.8°F | Resp 22 | Ht 70.0 in | Wt 278.9 lb

## 2014-06-12 DIAGNOSIS — E785 Hyperlipidemia, unspecified: Secondary | ICD-10-CM | POA: Insufficient documentation

## 2014-06-12 DIAGNOSIS — Z794 Long term (current) use of insulin: Secondary | ICD-10-CM | POA: Diagnosis not present

## 2014-06-12 DIAGNOSIS — J449 Chronic obstructive pulmonary disease, unspecified: Secondary | ICD-10-CM | POA: Diagnosis not present

## 2014-06-12 DIAGNOSIS — D696 Thrombocytopenia, unspecified: Secondary | ICD-10-CM

## 2014-06-12 DIAGNOSIS — Z87891 Personal history of nicotine dependence: Secondary | ICD-10-CM | POA: Diagnosis not present

## 2014-06-12 DIAGNOSIS — C3492 Malignant neoplasm of unspecified part of left bronchus or lung: Secondary | ICD-10-CM | POA: Insufficient documentation

## 2014-06-12 DIAGNOSIS — I10 Essential (primary) hypertension: Secondary | ICD-10-CM | POA: Diagnosis not present

## 2014-06-12 DIAGNOSIS — K7460 Unspecified cirrhosis of liver: Secondary | ICD-10-CM | POA: Diagnosis not present

## 2014-06-12 DIAGNOSIS — D649 Anemia, unspecified: Secondary | ICD-10-CM

## 2014-06-12 DIAGNOSIS — C3490 Malignant neoplasm of unspecified part of unspecified bronchus or lung: Secondary | ICD-10-CM

## 2014-06-12 DIAGNOSIS — Z79899 Other long term (current) drug therapy: Secondary | ICD-10-CM | POA: Diagnosis not present

## 2014-06-12 DIAGNOSIS — K745 Biliary cirrhosis, unspecified: Secondary | ICD-10-CM

## 2014-06-12 DIAGNOSIS — E119 Type 2 diabetes mellitus without complications: Secondary | ICD-10-CM | POA: Diagnosis not present

## 2014-06-12 DIAGNOSIS — C3402 Malignant neoplasm of left main bronchus: Secondary | ICD-10-CM

## 2014-06-12 DIAGNOSIS — Z7189 Other specified counseling: Secondary | ICD-10-CM

## 2014-06-12 DIAGNOSIS — R7989 Other specified abnormal findings of blood chemistry: Secondary | ICD-10-CM

## 2014-06-12 DIAGNOSIS — D509 Iron deficiency anemia, unspecified: Secondary | ICD-10-CM | POA: Insufficient documentation

## 2014-06-12 LAB — CBC WITH DIFFERENTIAL/PLATELET
Basophils Absolute: 0 10*3/uL (ref 0–0.1)
Basophils Relative: 1 %
EOS ABS: 0.1 10*3/uL (ref 0–0.7)
HCT: 34.7 % — ABNORMAL LOW (ref 40.0–52.0)
HEMOGLOBIN: 10.9 g/dL — AB (ref 13.0–18.0)
LYMPHS ABS: 0.3 10*3/uL — AB (ref 1.0–3.6)
MCH: 29.1 pg (ref 26.0–34.0)
MCHC: 31.3 g/dL — ABNORMAL LOW (ref 32.0–36.0)
MCV: 93 fL (ref 80.0–100.0)
MONO ABS: 0.4 10*3/uL (ref 0.2–1.0)
Neutro Abs: 3 10*3/uL (ref 1.4–6.5)
Neutrophils Relative %: 78 %
Platelets: 96 10*3/uL — ABNORMAL LOW (ref 150–440)
RBC: 3.73 MIL/uL — AB (ref 4.40–5.90)
RDW: 18.6 % — ABNORMAL HIGH (ref 11.5–14.5)
WBC: 3.8 10*3/uL (ref 3.8–10.6)

## 2014-06-12 MED ORDER — HEPARIN SOD (PORK) LOCK FLUSH 100 UNIT/ML IV SOLN
INTRAVENOUS | Status: AC
  Filled 2014-06-12: qty 5

## 2014-06-12 MED ORDER — HEPARIN SOD (PORK) LOCK FLUSH 100 UNIT/ML IV SOLN
500.0000 [IU] | Freq: Once | INTRAVENOUS | Status: AC
  Administered 2014-06-12: 500 [IU] via INTRAVENOUS

## 2014-06-12 MED ORDER — SODIUM CHLORIDE 0.9 % IJ SOLN
10.0000 mL | Freq: Once | INTRAMUSCULAR | Status: AC
  Administered 2014-06-12: 10 mL via INTRAVENOUS
  Filled 2014-06-12: qty 10

## 2014-06-20 DIAGNOSIS — E119 Type 2 diabetes mellitus without complications: Secondary | ICD-10-CM | POA: Insufficient documentation

## 2014-06-20 DIAGNOSIS — J449 Chronic obstructive pulmonary disease, unspecified: Secondary | ICD-10-CM | POA: Insufficient documentation

## 2014-06-20 DIAGNOSIS — I2510 Atherosclerotic heart disease of native coronary artery without angina pectoris: Secondary | ICD-10-CM | POA: Insufficient documentation

## 2014-06-20 DIAGNOSIS — R7989 Other specified abnormal findings of blood chemistry: Secondary | ICD-10-CM | POA: Insufficient documentation

## 2014-06-20 DIAGNOSIS — K7460 Unspecified cirrhosis of liver: Secondary | ICD-10-CM | POA: Insufficient documentation

## 2014-06-20 NOTE — Progress Notes (Signed)
[place] Progress note   Referred by dr Donnella Sham, other mds dr Josefa Half dr singh dr Loney Hering dr Gabriel Carina   This 68 y.o. 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    HPI:  See also 4/6 note, prior tx plan carbo plus vp 16 4 cycles,  neulasta with cycles 1 and 4,  Patient also follows multiple other providers. Has problems with wax and wane abdo girth, weight, which causes orthopnea and sob, also electrolytes, sodium, wax and wane, so diuretics adjusted by cardiology and nephrology to balance these. Recently lower exr edema remains controlled no cp no acute complaints    Review of Systems:  General: No acute distress, has chronic fatigue  HEENT: No headache, dizziness, ear or jaw pain, or epistaxis   Lungs:, No wheezing, No chest pain, No, hemoptysis  Cardiac: no chest pain, no palpitations,  GI: no vomiting, diahrrea,   GU: no dysuria, no hematuria,   Musculoskeletal:    Extremities:   Skin:   Neuro: no headache, no dizzy, no focal weakness   Psych: no anxiety, no depression   Allergies Allergies  Allergen Reactions  . No Known Allergies     Significant History/PMH: Past Medical History  Diagnosis Date  . Cataracts, both eyes   . CHF (congestive heart failure)   . COPD (chronic obstructive pulmonary disease)   . Detached retina   . Diabetes   . HTN (hypertension)   . Pleural effusion   . Hyperlipidemia   . IDA (iron deficiency anemia)   . Thrombocytopenia   . Small cell lung cancer   . Splenomegaly   . Azotemia   . GI bleed   . Cirrhosis   . Nephrotic range proteinuria    Past Surgical History  Procedure Laterality Date  . Fistula repair    . Laminectomy    . Cardiac stents    . Colonoscopy  12/18/12             Smoking History: Former smoker  [place]: Family History noncontributory  Comments:   Social History:  History  Alcohol Use No    Additional Past Medical and Surgical History:  egd in 2014  Av malformations    Home Medications: Prior to Admission medications   Medication Sig Start Date End Date Taking? Authorizing Provider  allopurinol (ZYLOPRIM) 100 MG tablet Take 100 mg by mouth daily.   Yes Historical Provider, MD  clopidogrel (PLAVIX) 75 MG tablet Take 75 mg by mouth daily.   Yes Historical Provider, MD  Ferrous Fum-Iron Polysacch-FA 162-115.2-1 MG CAPS Take 1 capsule by mouth 1 day or 1 dose.   Yes Historical Provider, MD  furosemide (LASIX) 40 MG tablet Take 40 mg by mouth 2 (two) times daily.    Yes Historical Provider, MD  Glucose Blood (BL TEST STRIP PACK VI) Use 4 (four) times daily. One Touch Ultra Test strip (blood sugar diagnostic)   - Use 1 strip via meter four times a day   Yes Historical Provider, MD  insulin lispro (HUMALOG) 100 UNIT/ML injection Inject 100 Units into the skin once. Once daily through Insulin pump; based on sliding scale sugar   Yes Historical Provider, MD  Insulin Syringe-Needle U-100 (INSULIN SYRINGE 1CC/30GX5/16") 30G X 5/16" 1 ML MISC Inject 1 Syringe into  the skin 3 (three) times daily.   Yes Historical Provider, MD  lactulose (CHRONULAC) 10 GM/15ML solution Take 30 g by mouth 2 (two) times daily as needed for mild constipation.   Yes Historical Provider, MD  metoprolol tartrate (LOPRESSOR) 25 MG tablet Take 12.5 mg by mouth 1 day or 1 dose.   Yes Historical Provider, MD  Multiple Vitamins-Minerals (CENTRUM ADULTS) TABS Take 1 tablet by mouth 1 day or 1 dose.   Yes Historical Provider, MD  nitroGLYCERIN (NITRODUR - DOSED IN MG/24 HR) 0.4 mg/hr patch Place 0.4 mg onto the skin daily.   Yes Historical Provider, MD  nitroGLYCERIN (NITROSTAT) 0.3 MG SL tablet Place 0.3 mg under the tongue every 5 (five) minutes as needed for chest pain.   Yes Historical  Provider, MD  omeprazole (PRILOSEC) 20 MG capsule Take 20 mg by mouth daily. 04/27/14  Yes Historical Provider, MD  polyethylene glycol (MIRALAX / GLYCOLAX) packet Take 17 g by mouth daily as needed for mild constipation.   Yes Historical Provider, MD  spironolactone (ALDACTONE) 100 MG tablet Take 100 mg by mouth as needed. For swelling 04/13/14  Yes Historical Provider, MD  sucralfate (CARAFATE) 1 G tablet Take 1 g by mouth 2 (two) times daily before a meal.   Yes Historical Provider, MD  tamsulosin (FLOMAX) 0.4 MG CAPS capsule Take 0.4 mg by mouth 1 day or 1 dose.   Yes Historical Provider, MD  pantoprazole (PROTONIX) 40 MG tablet Take 40 mg by mouth daily.    Historical Provider, MD  promethazine (PHENERGAN) 25 MG tablet Take 25 mg by mouth every 8 (eight) hours as needed for nausea or vomiting.    Historical Provider, MD    Vital Signs:  Blood pressure 108/70, pulse 71, temperature 98.8 F (37.1 C), temperature source Oral, resp. rate 22, height '5\' 10"'$  (1.778 m), weight 278 lb 14.4 oz (126.508 kg).  Physical Exam:  General: some pallor, no acute distress  Mental Status: alert and oriented to person, place and time  Head, Ears, Nose,Throat: No thrush  Respiratory: no rales, rhonchi, or wheezing, no dullness, decreased air entry  Cardiovascular: regular rate and rhythm  Gastrointestinal: obese, ascites, not tense, , no masses or organomegaly palpable but has hepatosplenomegaly on scan  Musculoskeletal: no lower extremity edema no calf tenderness  Skin: no rashes, has chronicforearm bruises  Neurological: No gross focal weakness cranial nerves intact  Lymphatics: Not palpable, neck supraclavicular, submandibular, axilla    Psych: Mood, Affect, Unremarkable    Laboratory Results: Appointment on 06/12/2014  Component Date Value Ref Range Status  . WBC 06/12/2014 3.8  3.8 - 10.6 K/uL Final  . RBC 06/12/2014 3.73* 4.40 - 5.90 MIL/uL Final  . Hemoglobin 06/12/2014 10.9* 13.0 - 18.0 g/dL  Final  . HCT 06/12/2014 34.7* 40.0 - 52.0 % Final  . MCV 06/12/2014 93.0  80.0 - 100.0 fL Final  . MCH 06/12/2014 29.1  26.0 - 34.0 pg Final  . MCHC 06/12/2014 31.3* 32.0 - 36.0 g/dL Final  . RDW 06/12/2014 18.6* 11.5 - 14.5 % Final  . Platelets 06/12/2014 96* 150 - 440 K/uL Final  . Neutrophils Relative % 06/12/2014 78%   Final  . Neutro Abs 06/12/2014 3.0  1.4 - 6.5 K/uL Final  . Lymphocytes Relative 06/12/2014 8%   Final  . Lymphs Abs 06/12/2014 0.3* 1.0 - 3.6 K/uL Final  . Monocytes Relative 06/12/2014 10%   Final  . Monocytes Absolute 06/12/2014 0.4  0.2 - 1.0 K/uL Final  .  Eosinophils Relative 06/12/2014 3%   Final  . Eosinophils Absolute 06/12/2014 0.1  0 - 0.7 K/uL Final  . Basophils Relative 06/12/2014 1%   Final  . Basophils Absolute 06/12/2014 0.0  0 - 0.1 K/uL Final          Radiology Results: No results found. last pet/ct dec30/16         Assessment and Plan: Impression:  See also problem list  Re cancer, left sided small cell with satellite lung lesion limited disease, s/p tx..following for possible relapse and salvage tx, but multiple comorbidities followed by other providers   Plan: f/u in 1 month. Planning non contrast ct chest and liver, repeat pet scan prn, possible salvage tx in future, chemotx would be limited by liver and lung disease

## 2014-07-14 ENCOUNTER — Inpatient Hospital Stay: Payer: Medicare Other | Attending: Family Medicine | Admitting: Family Medicine

## 2014-07-14 ENCOUNTER — Inpatient Hospital Stay: Payer: Medicare Other

## 2014-07-14 VITALS — BP 104/76 | HR 67 | Temp 96.0°F | Wt 251.5 lb

## 2014-07-14 DIAGNOSIS — R5383 Other fatigue: Secondary | ICD-10-CM | POA: Diagnosis not present

## 2014-07-14 DIAGNOSIS — J449 Chronic obstructive pulmonary disease, unspecified: Secondary | ICD-10-CM | POA: Insufficient documentation

## 2014-07-14 DIAGNOSIS — I10 Essential (primary) hypertension: Secondary | ICD-10-CM

## 2014-07-14 DIAGNOSIS — C801 Malignant (primary) neoplasm, unspecified: Secondary | ICD-10-CM

## 2014-07-14 DIAGNOSIS — Z79899 Other long term (current) drug therapy: Secondary | ICD-10-CM | POA: Diagnosis not present

## 2014-07-14 DIAGNOSIS — K7469 Other cirrhosis of liver: Secondary | ICD-10-CM

## 2014-07-14 DIAGNOSIS — Z85118 Personal history of other malignant neoplasm of bronchus and lung: Secondary | ICD-10-CM

## 2014-07-14 DIAGNOSIS — Z794 Long term (current) use of insulin: Secondary | ICD-10-CM | POA: Diagnosis not present

## 2014-07-14 DIAGNOSIS — R161 Splenomegaly, not elsewhere classified: Secondary | ICD-10-CM | POA: Insufficient documentation

## 2014-07-14 DIAGNOSIS — R7989 Other specified abnormal findings of blood chemistry: Secondary | ICD-10-CM

## 2014-07-14 DIAGNOSIS — Z87891 Personal history of nicotine dependence: Secondary | ICD-10-CM | POA: Insufficient documentation

## 2014-07-14 DIAGNOSIS — K7460 Unspecified cirrhosis of liver: Secondary | ICD-10-CM | POA: Diagnosis not present

## 2014-07-14 DIAGNOSIS — I2510 Atherosclerotic heart disease of native coronary artery without angina pectoris: Secondary | ICD-10-CM | POA: Diagnosis not present

## 2014-07-14 DIAGNOSIS — C3492 Malignant neoplasm of unspecified part of left bronchus or lung: Secondary | ICD-10-CM

## 2014-07-14 DIAGNOSIS — D509 Iron deficiency anemia, unspecified: Secondary | ICD-10-CM | POA: Insufficient documentation

## 2014-07-14 DIAGNOSIS — R0602 Shortness of breath: Secondary | ICD-10-CM | POA: Diagnosis not present

## 2014-07-14 DIAGNOSIS — E785 Hyperlipidemia, unspecified: Secondary | ICD-10-CM | POA: Insufficient documentation

## 2014-07-14 DIAGNOSIS — E119 Type 2 diabetes mellitus without complications: Secondary | ICD-10-CM | POA: Insufficient documentation

## 2014-07-14 DIAGNOSIS — D696 Thrombocytopenia, unspecified: Secondary | ICD-10-CM | POA: Insufficient documentation

## 2014-07-14 LAB — COMPREHENSIVE METABOLIC PANEL
ALT: 14 U/L — ABNORMAL LOW (ref 17–63)
ANION GAP: 9 (ref 5–15)
AST: 28 U/L (ref 15–41)
Albumin: 3.6 g/dL (ref 3.5–5.0)
Alkaline Phosphatase: 104 U/L (ref 38–126)
BUN: 22 mg/dL — ABNORMAL HIGH (ref 6–20)
CALCIUM: 8.5 mg/dL — AB (ref 8.9–10.3)
CO2: 19 mmol/L — ABNORMAL LOW (ref 22–32)
Chloride: 103 mmol/L (ref 101–111)
Creatinine, Ser: 1.49 mg/dL — ABNORMAL HIGH (ref 0.61–1.24)
GFR calc Af Amer: 54 mL/min — ABNORMAL LOW (ref >60)
GFR, EST NON AFRICAN AMERICAN: 47 mL/min — AB (ref >60)
Glucose, Bld: 126 mg/dL — ABNORMAL HIGH (ref 65–99)
Potassium: 4.5 mmol/L (ref 3.5–5.1)
SODIUM: 131 mmol/L — AB (ref 135–145)
TOTAL PROTEIN: 7.3 g/dL (ref 6.5–8.1)
Total Bilirubin: 0.7 mg/dL (ref 0.3–1.2)

## 2014-07-14 LAB — CBC WITH DIFFERENTIAL/PLATELET
BASOS ABS: 0 10*3/uL (ref 0–0.1)
Basophils Relative: 1 %
EOS ABS: 0.1 10*3/uL (ref 0–0.7)
EOS PCT: 4 %
HCT: 33.1 % — ABNORMAL LOW (ref 40.0–52.0)
Hemoglobin: 10.6 g/dL — ABNORMAL LOW (ref 13.0–18.0)
Lymphocytes Relative: 11 %
Lymphs Abs: 0.3 10*3/uL — ABNORMAL LOW (ref 1.0–3.6)
MCH: 29.1 pg (ref 26.0–34.0)
MCHC: 31.9 g/dL — ABNORMAL LOW (ref 32.0–36.0)
MCV: 91.4 fL (ref 80.0–100.0)
MONO ABS: 0.3 10*3/uL (ref 0.2–1.0)
Monocytes Relative: 12 %
Neutro Abs: 1.7 10*3/uL (ref 1.4–6.5)
Neutrophils Relative %: 72 %
PLATELETS: 84 10*3/uL — AB (ref 150–440)
RBC: 3.63 MIL/uL — ABNORMAL LOW (ref 4.40–5.90)
RDW: 19.4 % — AB (ref 11.5–14.5)
WBC: 2.4 10*3/uL — ABNORMAL LOW (ref 3.8–10.6)

## 2014-07-14 MED ORDER — HEPARIN SOD (PORK) LOCK FLUSH 100 UNIT/ML IV SOLN
INTRAVENOUS | Status: AC
  Filled 2014-07-14: qty 5

## 2014-07-14 MED ORDER — SODIUM CHLORIDE 0.9 % IJ SOLN
10.0000 mL | INTRAMUSCULAR | Status: AC | PRN
  Administered 2014-07-14: 10 mL via INTRAVENOUS
  Filled 2014-07-14: qty 10

## 2014-07-14 MED ORDER — HEPARIN SOD (PORK) LOCK FLUSH 100 UNIT/ML IV SOLN
500.0000 [IU] | Freq: Once | INTRAVENOUS | Status: AC
  Administered 2014-07-14: 500 [IU] via INTRAVENOUS

## 2014-07-14 NOTE — Progress Notes (Signed)
   07/14/14 1100  Clinical Encounter Type  Visited With Patient and family together  Visit Type Initial  Referral From Care management  Consult/Referral To Chaplain  Provided assistance with HCPOA/Living Will forms and education, also assistance with completing forms.  [place] [phone]

## 2014-07-14 NOTE — Progress Notes (Signed)
[place]  [phone]  Fax:([phone]) [place] DOB: 31-Dec-1945  MR#: [id number]  JYN#:[id number]  Patient Care Team: Derinda Late, MD as PCP - General (Family Medicine)  CHIEF COMPLAINT:  Chief Complaint  Patient presents with  . Follow-up   Re: Small cell lung cancer, previously treated with carboplatin plus VP-16 4 cycles. Patient also does have a significant history of cirrhosis of liver, COPD, CAD, renal azotemia, and diabetes mellitus.  INTERVAL HISTORY:  Patient returns to clinic today for continued follow-up regarding a lung CA. Patient's last restaging PET scan was in 2015, December. He reports feeling well today. Has some intermittent chronic shortness of breath with exertion and some mild generalized weakness.  REVIEW OF SYSTEMS:   Review of Systems  Constitutional: Negative for fever, chills, weight loss, malaise/fatigue and diaphoresis.  HENT: Negative for congestion, ear discharge, ear pain, hearing loss, nosebleeds, sore throat and tinnitus.   Eyes: Negative for blurred vision, double vision, photophobia, pain, discharge and redness.  Respiratory: Negative for cough, hemoptysis, sputum production, shortness of breath, wheezing and stridor.   Cardiovascular: Negative for chest pain, palpitations, orthopnea, claudication, leg swelling and PND.  Gastrointestinal: Negative for heartburn, nausea, vomiting, abdominal pain, diarrhea, constipation, blood in stool and melena.  Genitourinary: Negative.   Musculoskeletal: Negative.   Skin: Negative.   Neurological: Negative for dizziness, tingling, focal weakness, seizures, weakness and headaches.  Endo/Heme/Allergies: Does not bruise/bleed easily.  Psychiatric/Behavioral: Negative for depression. The patient is not nervous/anxious and does not have insomnia.     As per HPI. Otherwise, a complete review of systems is negatve.  ONCOLOGY HISTORY:  No history exists.     PAST MEDICAL HISTORY: Past Medical History  Diagnosis Date  . Cataracts, both eyes   . CHF (congestive heart failure)   . COPD (chronic obstructive pulmonary disease)   . Detached retina   . Diabetes   . HTN (hypertension)   . Pleural effusion   . Hyperlipidemia   . IDA (iron deficiency anemia)   . Thrombocytopenia   . Small cell lung cancer   . Splenomegaly   . Azotemia   . GI bleed   . Cirrhosis   . Nephrotic range proteinuria     PAST SURGICAL HISTORY: Past Surgical History  Procedure Laterality Date  . Fistula repair    . Laminectomy    . Cardiac stents    . Colonoscopy  12/18/12    FAMILY HISTORY No family history on file.  GYNECOLOGIC HISTORY:  No LMP for male patient.     ADVANCED DIRECTIVES:    HEALTH MAINTENANCE: History  Substance Use Topics  . Smoking status: Former Research scientist (life sciences)  . Smokeless tobacco: Not on file  . Alcohol Use: No     Colonoscopy:  PAP:  Bone density:  Lipid panel:  Allergies  Allergen Reactions  . No Known Allergies     Current Outpatient Prescriptions  Medication Sig Dispense Refill  . allopurinol (ZYLOPRIM) 100 MG tablet Take 100 mg by mouth daily.    . clopidogrel (PLAVIX) 75 MG tablet Take 75 mg by mouth daily.    . furosemide (LASIX) 40 MG tablet Take 40 mg by mouth 2 (two) times daily.     . Glucose Blood (BL TEST STRIP PACK VI) Use 4 (four) times daily. One Touch Ultra Test strip (blood sugar diagnostic)   - Use 1 strip via meter four times a day    . insulin lispro (HUMALOG)  100 UNIT/ML injection Inject 100 Units into the skin once. Once daily through Insulin pump; based on sliding scale sugar    . Insulin Syringe-Needle U-100 (INSULIN SYRINGE 1CC/30GX5/16") 30G X 5/16" 1 ML MISC Inject 1 Syringe into the skin 3 (three) times daily.    Marland Kitchen lactulose (CHRONULAC) 10 GM/15ML solution Take 30 g by mouth 2 (two) times daily as needed for mild constipation.    . metoprolol tartrate (LOPRESSOR) 25 MG tablet Take 12.5 mg by  mouth 1 day or 1 dose.    . Multiple Vitamins-Minerals (CENTRUM ADULTS) TABS Take 1 tablet by mouth 1 day or 1 dose.    . nitroGLYCERIN (NITRODUR - DOSED IN MG/24 HR) 0.4 mg/hr patch Place 0.4 mg onto the skin daily.    . nitroGLYCERIN (NITROSTAT) 0.3 MG SL tablet Place 0.3 mg under the tongue every 5 (five) minutes as needed for chest pain.    Marland Kitchen omeprazole (PRILOSEC) 20 MG capsule Take 20 mg by mouth daily.  3  . pantoprazole (PROTONIX) 40 MG tablet Take 40 mg by mouth daily.    . polyethylene glycol (MIRALAX / GLYCOLAX) packet Take 17 g by mouth daily as needed for mild constipation.    . promethazine (PHENERGAN) 25 MG tablet Take 25 mg by mouth every 8 (eight) hours as needed for nausea or vomiting.    Marland Kitchen spironolactone (ALDACTONE) 100 MG tablet Take 100 mg by mouth as needed. For swelling  0  . sucralfate (CARAFATE) 1 G tablet Take 1 g by mouth 2 (two) times daily before a meal.    . tamsulosin (FLOMAX) 0.4 MG CAPS capsule Take 0.4 mg by mouth 1 day or 1 dose.    . Ferrous Fum-Iron Polysacch-FA 162-115.2-1 MG CAPS Take 1 capsule by mouth 1 day or 1 dose.    Marland Kitchen TANDEM 162-115.2 MG CAPS TK ONE C PO QD  3   No current facility-administered medications for this visit.   Facility-Administered Medications Ordered in Other Visits  Medication Dose Route Frequency Provider Last Rate Last Dose  . sodium chloride 0.9 % injection 10 mL  10 mL Intravenous PRN Evlyn Kanner, NP   10 mL at 07/14/14 1125    OBJECTIVE: BP 104/76 mmHg  Pulse 67  Temp(Src) 96 F (35.6 C) (Tympanic)  Wt 251 lb 8.7 oz (114.1 kg)   Body mass index is 36.09 kg/(m^2).    ECOG FS:1 - Symptomatic but completely ambulatory  General: Well-developed, well-nourished, no acute distress. Eyes: Pink conjunctiva, anicteric sclera. HEENT: Normocephalic, moist mucous membranes, clear oropharnyx. Lungs: Clear to auscultation bilaterally. Shortness of breath with exertion Heart: Regular rate and rhythm. No rubs, murmurs, or  gallops. Abdomen: Soft, nontender, nondistended. No organomegaly noted, normoactive bowel sounds. Musculoskeletal: No edema, cyanosis, or clubbing. Neuro: Alert, answering all questions appropriately. Cranial nerves grossly intact. Skin: No rashes or petechiae noted. Psych: Normal affect. Lymphatics: No cervical, calvicular, axillary or inguinal LAD.   LAB RESULTS:     Component Value Date/Time   NA 131* 07/14/2014 1108   NA 126* 05/03/2014 1112   K 4.5 07/14/2014 1108   K 4.8 05/03/2014 1112   CL 103 07/14/2014 1108   CL 97* 05/03/2014 1112   CO2 19* 07/14/2014 1108   CO2 22 05/03/2014 1112   GLUCOSE 126* 07/14/2014 1108   GLUCOSE 93 05/03/2014 1112   BUN 22* 07/14/2014 1108   BUN 30* 05/03/2014 1112   CREATININE 1.49* 07/14/2014 1108   CREATININE 1.55* 05/03/2014 1112   CALCIUM  8.5* 07/14/2014 1108   CALCIUM 8.8* 05/03/2014 1112   PROT 7.3 07/14/2014 1108   PROT 7.2 05/03/2014 1112   ALBUMIN 3.6 07/14/2014 1108   ALBUMIN 3.5 05/03/2014 1112   AST 28 07/14/2014 1108   AST 27 05/03/2014 1112   ALT 14* 07/14/2014 1108   ALT 14* 05/03/2014 1112   ALKPHOS 104 07/14/2014 1108   ALKPHOS 105 05/03/2014 1112   BILITOT 0.7 07/14/2014 1108   GFRNONAA 47* 07/14/2014 1108   GFRNONAA 46* 05/03/2014 1112   GFRAA 54* 07/14/2014 1108   GFRAA 53* 05/03/2014 1112    No results found for: SPEP, UPEP  Lab Results  Component Value Date   WBC 2.4* 07/14/2014   NEUTROABS 1.7 07/14/2014   HGB 10.6* 07/14/2014   HCT 33.1* 07/14/2014   MCV 91.4 07/14/2014   PLT 84* 07/14/2014      Chemistry      Component Value Date/Time   NA 131* 07/14/2014 1108   NA 126* 05/03/2014 1112   K 4.5 07/14/2014 1108   K 4.8 05/03/2014 1112   CL 103 07/14/2014 1108   CL 97* 05/03/2014 1112   CO2 19* 07/14/2014 1108   CO2 22 05/03/2014 1112   BUN 22* 07/14/2014 1108   BUN 30* 05/03/2014 1112   CREATININE 1.49* 07/14/2014 1108   CREATININE 1.55* 05/03/2014 1112      Component Value Date/Time    CALCIUM 8.5* 07/14/2014 1108   CALCIUM 8.8* 05/03/2014 1112   ALKPHOS 104 07/14/2014 1108   ALKPHOS 105 05/03/2014 1112   AST 28 07/14/2014 1108   AST 27 05/03/2014 1112   ALT 14* 07/14/2014 1108   ALT 14* 05/03/2014 1112   BILITOT 0.7 07/14/2014 1108       No results found for: LABCA2  No components found for: LABCA125  No results for input(s): INR in the last 168 hours.  No results found for: COLORURINE, APPEARANCEUR, LABSPEC, PHURINE, GLUCOSEU, HGBUR, BILIRUBINUR, KETONESUR, PROTEINUR, UROBILINOGEN, NITRITE, LEUKOCYTESUR  STUDIES: No results found.  ASSESSMENT:  Small cell carcinoma of lung, left.  PLAN:   1. Lung CA. Patient has completed 4 cycles of carboplatin plus VP-16. He continues with some shortness of breath on exertion. Today on reexamination patient is feeling well overall. Previously seen in May 2016 and Dr. Inez Pilgrim recommended a repeat scan chest and abdomen. We will go ahead and order repeat CT scan without contrast due to slightly elevated creatinine. We'll plan to reevaluate patient after CT scan is completed and implant for appropriate follow-up.  Patient expressed understanding and was in agreement with this plan. He also understands that He can call clinic at any time with any questions, concerns, or complaints.    No matching staging information was found for the patient.  Evlyn Kanner, NP   07/14/2014 12:21 PM

## 2014-07-26 ENCOUNTER — Ambulatory Visit
Admission: RE | Admit: 2014-07-26 | Discharge: 2014-07-26 | Disposition: A | Discharge status: Home / Self Care | Payer: Medicare Other | Source: Ambulatory Visit | Attending: Family Medicine | Admitting: Family Medicine

## 2014-07-26 DIAGNOSIS — R161 Splenomegaly, not elsewhere classified: Secondary | ICD-10-CM | POA: Diagnosis not present

## 2014-07-26 DIAGNOSIS — K766 Portal hypertension: Secondary | ICD-10-CM | POA: Diagnosis not present

## 2014-07-26 DIAGNOSIS — K7469 Other cirrhosis of liver: Secondary | ICD-10-CM | POA: Diagnosis present

## 2014-07-26 DIAGNOSIS — C801 Malignant (primary) neoplasm, unspecified: Secondary | ICD-10-CM

## 2014-07-26 DIAGNOSIS — J90 Pleural effusion, not elsewhere classified: Secondary | ICD-10-CM | POA: Insufficient documentation

## 2014-07-26 DIAGNOSIS — K7460 Unspecified cirrhosis of liver: Secondary | ICD-10-CM | POA: Insufficient documentation

## 2014-07-26 DIAGNOSIS — R188 Other ascites: Secondary | ICD-10-CM | POA: Insufficient documentation

## 2014-07-27 ENCOUNTER — Other Ambulatory Visit: Payer: Self-pay | Admitting: Family Medicine

## 2014-07-27 ENCOUNTER — Encounter: Payer: Self-pay | Admitting: Family Medicine

## 2014-07-27 DIAGNOSIS — C3490 Malignant neoplasm of unspecified part of unspecified bronchus or lung: Secondary | ICD-10-CM

## 2014-07-27 HISTORY — DX: Malignant neoplasm of unspecified part of unspecified bronchus or lung: C34.90

## 2014-07-28 ENCOUNTER — Inpatient Hospital Stay: Payer: Medicare Other

## 2014-07-28 ENCOUNTER — Inpatient Hospital Stay: Payer: Medicare Other | Admitting: Family Medicine

## 2014-08-03 ENCOUNTER — Inpatient Hospital Stay: Payer: Medicare Other | Attending: Family Medicine

## 2014-08-03 DIAGNOSIS — C3490 Malignant neoplasm of unspecified part of unspecified bronchus or lung: Secondary | ICD-10-CM | POA: Insufficient documentation

## 2014-08-03 DIAGNOSIS — Z452 Encounter for adjustment and management of vascular access device: Secondary | ICD-10-CM | POA: Diagnosis not present

## 2014-08-03 DIAGNOSIS — C801 Malignant (primary) neoplasm, unspecified: Secondary | ICD-10-CM

## 2014-08-03 MED ORDER — HEPARIN SOD (PORK) LOCK FLUSH 100 UNIT/ML IV SOLN
INTRAVENOUS | Status: AC
  Filled 2014-08-03: qty 5

## 2014-08-03 MED ORDER — SODIUM CHLORIDE 0.9 % IJ SOLN
10.0000 mL | INTRAMUSCULAR | Status: DC | PRN
  Administered 2014-08-03: 10 mL via INTRAVENOUS
  Filled 2014-08-03: qty 10

## 2014-08-03 MED ORDER — HEPARIN SOD (PORK) LOCK FLUSH 100 UNIT/ML IV SOLN
500.0000 [IU] | Freq: Once | INTRAVENOUS | Status: AC
  Administered 2014-08-03: 500 [IU] via INTRAVENOUS

## 2014-09-13 ENCOUNTER — Encounter (INDEPENDENT_AMBULATORY_CARE_PROVIDER_SITE_OTHER): Payer: Self-pay

## 2014-09-13 ENCOUNTER — Inpatient Hospital Stay: Payer: Medicare Other | Attending: Family Medicine

## 2014-09-13 DIAGNOSIS — C801 Malignant (primary) neoplasm, unspecified: Secondary | ICD-10-CM

## 2014-09-13 DIAGNOSIS — C3492 Malignant neoplasm of unspecified part of left bronchus or lung: Secondary | ICD-10-CM | POA: Insufficient documentation

## 2014-09-13 DIAGNOSIS — Z452 Encounter for adjustment and management of vascular access device: Secondary | ICD-10-CM | POA: Diagnosis not present

## 2014-09-13 MED ORDER — SODIUM CHLORIDE 0.9 % IJ SOLN
10.0000 mL | INTRAMUSCULAR | Status: DC | PRN
  Administered 2014-09-13: 10 mL via INTRAVENOUS
  Filled 2014-09-13: qty 10

## 2014-09-13 MED ORDER — HEPARIN SOD (PORK) LOCK FLUSH 100 UNIT/ML IV SOLN
500.0000 [IU] | Freq: Once | INTRAVENOUS | Status: AC
  Administered 2014-09-13: 500 [IU] via INTRAVENOUS
  Filled 2014-09-13: qty 5

## 2014-10-27 ENCOUNTER — Other Ambulatory Visit: Payer: Self-pay | Admitting: *Deleted

## 2014-10-27 ENCOUNTER — Inpatient Hospital Stay: Payer: Medicare Other

## 2014-10-27 ENCOUNTER — Inpatient Hospital Stay: Payer: Medicare Other | Admitting: Family Medicine

## 2014-10-27 DIAGNOSIS — C3492 Malignant neoplasm of unspecified part of left bronchus or lung: Secondary | ICD-10-CM

## 2014-11-03 ENCOUNTER — Inpatient Hospital Stay (HOSPITAL_BASED_OUTPATIENT_CLINIC_OR_DEPARTMENT_OTHER): Payer: Medicare Other | Admitting: Family Medicine

## 2014-11-03 ENCOUNTER — Inpatient Hospital Stay: Payer: Medicare Other | Attending: Family Medicine

## 2014-11-03 ENCOUNTER — Inpatient Hospital Stay: Payer: Medicare Other

## 2014-11-03 VITALS — BP 142/71 | HR 80 | Temp 95.4°F | Resp 18 | Wt 237.8 lb

## 2014-11-03 DIAGNOSIS — R7989 Other specified abnormal findings of blood chemistry: Secondary | ICD-10-CM

## 2014-11-03 DIAGNOSIS — E785 Hyperlipidemia, unspecified: Secondary | ICD-10-CM | POA: Insufficient documentation

## 2014-11-03 DIAGNOSIS — D709 Neutropenia, unspecified: Secondary | ICD-10-CM | POA: Diagnosis not present

## 2014-11-03 DIAGNOSIS — Z9221 Personal history of antineoplastic chemotherapy: Secondary | ICD-10-CM

## 2014-11-03 DIAGNOSIS — K766 Portal hypertension: Secondary | ICD-10-CM | POA: Diagnosis not present

## 2014-11-03 DIAGNOSIS — Z23 Encounter for immunization: Secondary | ICD-10-CM | POA: Diagnosis not present

## 2014-11-03 DIAGNOSIS — R162 Hepatomegaly with splenomegaly, not elsewhere classified: Secondary | ICD-10-CM

## 2014-11-03 DIAGNOSIS — E119 Type 2 diabetes mellitus without complications: Secondary | ICD-10-CM | POA: Diagnosis not present

## 2014-11-03 DIAGNOSIS — R531 Weakness: Secondary | ICD-10-CM

## 2014-11-03 DIAGNOSIS — Z85118 Personal history of other malignant neoplasm of bronchus and lung: Secondary | ICD-10-CM | POA: Insufficient documentation

## 2014-11-03 DIAGNOSIS — J90 Pleural effusion, not elsewhere classified: Secondary | ICD-10-CM

## 2014-11-03 DIAGNOSIS — Z452 Encounter for adjustment and management of vascular access device: Secondary | ICD-10-CM | POA: Insufficient documentation

## 2014-11-03 DIAGNOSIS — I10 Essential (primary) hypertension: Secondary | ICD-10-CM

## 2014-11-03 DIAGNOSIS — R809 Proteinuria, unspecified: Secondary | ICD-10-CM | POA: Diagnosis not present

## 2014-11-03 DIAGNOSIS — K7460 Unspecified cirrhosis of liver: Secondary | ICD-10-CM | POA: Insufficient documentation

## 2014-11-03 DIAGNOSIS — I509 Heart failure, unspecified: Secondary | ICD-10-CM

## 2014-11-03 DIAGNOSIS — D500 Iron deficiency anemia secondary to blood loss (chronic): Secondary | ICD-10-CM | POA: Diagnosis not present

## 2014-11-03 DIAGNOSIS — Z794 Long term (current) use of insulin: Secondary | ICD-10-CM

## 2014-11-03 DIAGNOSIS — N289 Disorder of kidney and ureter, unspecified: Secondary | ICD-10-CM

## 2014-11-03 DIAGNOSIS — G4730 Sleep apnea, unspecified: Secondary | ICD-10-CM | POA: Insufficient documentation

## 2014-11-03 DIAGNOSIS — Z87891 Personal history of nicotine dependence: Secondary | ICD-10-CM | POA: Insufficient documentation

## 2014-11-03 DIAGNOSIS — Z79899 Other long term (current) drug therapy: Secondary | ICD-10-CM

## 2014-11-03 DIAGNOSIS — J449 Chronic obstructive pulmonary disease, unspecified: Secondary | ICD-10-CM

## 2014-11-03 DIAGNOSIS — D696 Thrombocytopenia, unspecified: Secondary | ICD-10-CM | POA: Insufficient documentation

## 2014-11-03 DIAGNOSIS — C3490 Malignant neoplasm of unspecified part of unspecified bronchus or lung: Secondary | ICD-10-CM

## 2014-11-03 DIAGNOSIS — R0602 Shortness of breath: Secondary | ICD-10-CM | POA: Diagnosis not present

## 2014-11-03 DIAGNOSIS — C3492 Malignant neoplasm of unspecified part of left bronchus or lung: Secondary | ICD-10-CM

## 2014-11-03 LAB — COMPREHENSIVE METABOLIC PANEL
ALT: 20 U/L (ref 17–63)
AST: 38 U/L (ref 15–41)
Albumin: 3.5 g/dL (ref 3.5–5.0)
Alkaline Phosphatase: 124 U/L (ref 38–126)
Anion gap: 5 (ref 5–15)
BUN: 22 mg/dL — ABNORMAL HIGH (ref 6–20)
CHLORIDE: 107 mmol/L (ref 101–111)
CO2: 19 mmol/L — ABNORMAL LOW (ref 22–32)
Calcium: 8.3 mg/dL — ABNORMAL LOW (ref 8.9–10.3)
Creatinine, Ser: 1.43 mg/dL — ABNORMAL HIGH (ref 0.61–1.24)
GFR, EST AFRICAN AMERICAN: 57 mL/min — AB (ref >60)
GFR, EST NON AFRICAN AMERICAN: 49 mL/min — AB (ref >60)
Glucose, Bld: 155 mg/dL — ABNORMAL HIGH (ref 65–99)
Potassium: 4.5 mmol/L (ref 3.5–5.1)
Sodium: 131 mmol/L — ABNORMAL LOW (ref 135–145)
Total Bilirubin: 0.6 mg/dL (ref 0.3–1.2)
Total Protein: 7.3 g/dL (ref 6.5–8.1)

## 2014-11-03 LAB — CBC WITH DIFFERENTIAL/PLATELET
Basophils Absolute: 0 10*3/uL (ref 0–0.1)
Basophils Relative: 0 %
EOS PCT: 3 %
Eosinophils Absolute: 0.1 10*3/uL (ref 0–0.7)
HCT: 33.3 % — ABNORMAL LOW (ref 40.0–52.0)
Hemoglobin: 10.9 g/dL — ABNORMAL LOW (ref 13.0–18.0)
LYMPHS ABS: 0.3 10*3/uL — AB (ref 1.0–3.6)
LYMPHS PCT: 12 %
MCH: 31 pg (ref 26.0–34.0)
MCHC: 32.8 g/dL (ref 32.0–36.0)
MCV: 94.6 fL (ref 80.0–100.0)
MONO ABS: 0.2 10*3/uL (ref 0.2–1.0)
MONOS PCT: 10 %
Neutro Abs: 1.8 10*3/uL (ref 1.4–6.5)
Neutrophils Relative %: 75 %
PLATELETS: 65 10*3/uL — AB (ref 150–440)
RBC: 3.52 MIL/uL — AB (ref 4.40–5.90)
RDW: 18.7 % — AB (ref 11.5–14.5)
WBC: 2.4 10*3/uL — ABNORMAL LOW (ref 3.8–10.6)

## 2014-11-03 MED ORDER — HEPARIN SOD (PORK) LOCK FLUSH 100 UNIT/ML IV SOLN
500.0000 [IU] | Freq: Once | INTRAVENOUS | Status: DC
  Filled 2014-11-03: qty 5

## 2014-11-03 MED ORDER — INFLUENZA VAC SPLIT QUAD 0.5 ML IM SUSY
0.5000 mL | PREFILLED_SYRINGE | Freq: Once | INTRAMUSCULAR | Status: AC
  Administered 2014-11-03: 0.5 mL via INTRAMUSCULAR

## 2014-11-03 MED ORDER — SODIUM CHLORIDE 0.9 % IJ SOLN
10.0000 mL | INTRAMUSCULAR | Status: DC | PRN
  Filled 2014-11-03: qty 10

## 2014-11-03 NOTE — Progress Notes (Signed)
[place]  [phone]  Fax:([phone]) [place] DOB: 07/07/1945  MR#: [id number]  LPF#:[id number]  Patient Care Team: Derinda Late, MD as PCP - General (Family Medicine)  CHIEF COMPLAINT:  Chief Complaint  Patient presents with  . Lung Cancer    Pt is here for follow up of lung cancer.     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 The patient has a complicated past medical history. He has known hepatosplenomegaly, attributed to fatty infiltration of the liver. He has cirrhosis and portal hypertension. He has a history of anemia status post GI bleed with AV malformations. 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 He has renal insufficiency, hypertension, COPD and sleep apnea.  INTERVAL HISTORY:  Patient returns to clinic today for continued follow-up regarding a lung CA. Patient's last restaging PET scan was in 2015, December, and a CT scan of chest was performed in June 2016. He reports feeling well today. Has some intermittent chronic shortness of breath with exertion and some mild generalized weakness.  REVIEW OF SYSTEMS:   Review of Systems  Constitutional: Negative for fever, chills, weight loss, malaise/fatigue and diaphoresis.  HENT: Negative for congestion, ear discharge, ear pain, hearing loss, nosebleeds, sore throat and tinnitus.   Eyes: Negative for blurred vision, double vision, photophobia, pain, discharge and redness.  Respiratory: Negative for cough, hemoptysis, sputum production, shortness of breath, wheezing and stridor.   Cardiovascular: Negative for chest pain, palpitations, orthopnea, claudication, leg swelling and PND.  Gastrointestinal: Negative for heartburn, nausea, vomiting, abdominal pain, diarrhea, constipation, blood in stool and melena.  Genitourinary: Negative.   Musculoskeletal: Negative.   Skin: Negative.   Neurological: Negative for  dizziness, tingling, focal weakness, seizures, weakness and headaches.  Endo/Heme/Allergies: Does not bruise/bleed easily.  Psychiatric/Behavioral: Negative for depression. The patient is not nervous/anxious and does not have insomnia.     As per HPI. Otherwise, a complete review of systems is negatve.  ONCOLOGY HISTORY:   Small cell carcinoma of lung ([place])   07/27/2014 Initial Diagnosis Small cell carcinoma of lung (HCC)    PAST MEDICAL HISTORY: Past Medical History  Diagnosis Date  . Cataracts, both eyes   . CHF (congestive heart failure)   . COPD (chronic obstructive pulmonary disease)   . Detached retina   . Diabetes   . HTN (hypertension)   . Pleural effusion   . Hyperlipidemia   . IDA (iron deficiency anemia)   . Thrombocytopenia   . Small cell lung cancer   . Splenomegaly   . Azotemia   . GI bleed   . Cirrhosis   . Nephrotic range proteinuria   . Renal insufficiency   . Small cell carcinoma of lung 07/27/2014    PAST SURGICAL HISTORY: Past Surgical History  Procedure Laterality Date  . Fistula repair    . Laminectomy    . Cardiac stents    . Colonoscopy  12/18/12    FAMILY HISTORY No family history on file.  GYNECOLOGIC HISTORY:  No LMP for male patient.     ADVANCED DIRECTIVES:    HEALTH MAINTENANCE: Social History  Substance Use Topics  . Smoking status: Former Research scientist (life sciences)  . Smokeless tobacco: Not on file  . Alcohol Use: No     Colonoscopy:  PAP:  Bone density:  Lipid panel:  Allergies  Allergen Reactions  . No Known Allergies     Current Outpatient Prescriptions  Medication Sig  Dispense Refill  . allopurinol (ZYLOPRIM) 100 MG tablet Take 100 mg by mouth daily.    . clopidogrel (PLAVIX) 75 MG tablet Take 75 mg by mouth daily.    . Ferrous Fum-Iron Polysacch-FA 162-115.2-1 MG CAPS Take 1 capsule by mouth 1 day or 1 dose.    . furosemide (LASIX) 40 MG tablet Take 40 mg by mouth 2 (two) times daily.     Marland Kitchen GENERLAC 10 GM/15ML SOLN TK 30 ML  PO BID  5  . Glucose Blood (BL TEST STRIP PACK VI) Use 4 (four) times daily. One Touch Ultra Test strip (blood sugar diagnostic)   - Use 1 strip via meter four times a day    . insulin lispro (HUMALOG) 100 UNIT/ML injection Inject 100 Units into the skin once. Once daily through Insulin pump; based on sliding scale sugar    . Insulin Syringe-Needle U-100 (INSULIN SYRINGE 1CC/30GX5/16") 30G X 5/16" 1 ML MISC Inject 1 Syringe into the skin 3 (three) times daily.    Marland Kitchen lactulose (CHRONULAC) 10 GM/15ML solution Take 30 g by mouth 2 (two) times daily as needed for mild constipation.    . Multiple Vitamins-Minerals (CENTRUM ADULTS) TABS Take 1 tablet by mouth 1 day or 1 dose.    . nitroGLYCERIN (NITRODUR - DOSED IN MG/24 HR) 0.4 mg/hr patch Place 0.4 mg onto the skin daily.    . nitroGLYCERIN (NITROSTAT) 0.3 MG SL tablet Place 0.3 mg under the tongue every 5 (five) minutes as needed for chest pain.    Marland Kitchen omeprazole (PRILOSEC) 20 MG capsule Take 20 mg by mouth daily.  3  . pantoprazole (PROTONIX) 40 MG tablet Take 40 mg by mouth daily.    . polyethylene glycol (MIRALAX / GLYCOLAX) packet Take 17 g by mouth daily as needed for mild constipation.    . promethazine (PHENERGAN) 25 MG tablet Take 25 mg by mouth every 8 (eight) hours as needed for nausea or vomiting.    Marland Kitchen spironolactone (ALDACTONE) 100 MG tablet Take 100 mg by mouth as needed. For swelling  0  . sucralfate (CARAFATE) 1 G tablet Take 1 g by mouth 2 (two) times daily before a meal.    . tamsulosin (FLOMAX) 0.4 MG CAPS capsule Take 0.4 mg by mouth 1 day or 1 dose.    Marland Kitchen TANDEM 162-115.2 MG CAPS TK ONE C PO QD  3  . metoprolol tartrate (LOPRESSOR) 25 MG tablet Take 12.5 mg by mouth 1 day or 1 dose.     No current facility-administered medications for this visit.   Facility-Administered Medications Ordered in Other Visits  Medication Dose Route Frequency Provider Last Rate Last Dose  . heparin lock flush 100 unit/mL  500 Units Intravenous Once  Evlyn Kanner, NP      . sodium chloride 0.9 % injection 10 mL  10 mL Intravenous PRN Evlyn Kanner, NP   10 mL at 07/14/14 1125  . sodium chloride 0.9 % injection 10 mL  10 mL Intravenous PRN Evlyn Kanner, NP        OBJECTIVE: BP 142/71 mmHg  Pulse 80  Temp(Src) 95.4 F (35.2 C) (Tympanic)  Resp 18  Wt 237 lb 12.8 oz (107.865 kg)   Body mass index is 34.12 kg/(m^2).    ECOG FS:1 - Symptomatic but completely ambulatory  General: Well-developed, well-nourished, no acute distress. Eyes: Pink conjunctiva, anicteric sclera. HEENT: Normocephalic, moist mucous membranes, clear oropharnyx. Lungs: Diminshed air entry in bases. Shortness of breath with exertion  Heart: Regular rate and rhythm. No rubs, murmurs, or gallops. Abdomen: Soft, nontender, nondistended. No organomegaly noted, normoactive bowel sounds. Musculoskeletal: No edema, cyanosis, or clubbing. Neuro: Alert, answering all questions appropriately. Cranial nerves grossly intact. Skin: No rashes or petechiae noted. Psych: Normal affect.    LAB RESULTS:     Component Value Date/Time   NA 131* 11/03/2014 1443   NA 126* 05/03/2014 1112   K 4.5 11/03/2014 1443   K 4.8 05/03/2014 1112   CL 107 11/03/2014 1443   CL 97* 05/03/2014 1112   CO2 19* 11/03/2014 1443   CO2 22 05/03/2014 1112   GLUCOSE 155* 11/03/2014 1443   GLUCOSE 93 05/03/2014 1112   BUN 22* 11/03/2014 1443   BUN 30* 05/03/2014 1112   CREATININE 1.43* 11/03/2014 1443   CREATININE 1.55* 05/03/2014 1112   CALCIUM 8.3* 11/03/2014 1443   CALCIUM 8.8* 05/03/2014 1112   PROT 7.3 11/03/2014 1443   PROT 7.2 05/03/2014 1112   ALBUMIN 3.5 11/03/2014 1443   ALBUMIN 3.5 05/03/2014 1112   AST 38 11/03/2014 1443   AST 27 05/03/2014 1112   ALT 20 11/03/2014 1443   ALT 14* 05/03/2014 1112   ALKPHOS 124 11/03/2014 1443   ALKPHOS 105 05/03/2014 1112   BILITOT 0.6 11/03/2014 1443   BILITOT 0.9 05/03/2014 1112   GFRNONAA 49* 11/03/2014 1443   GFRNONAA 46*  05/03/2014 1112   GFRNONAA 33* 03/06/2014 0959   GFRAA 57* 11/03/2014 1443   GFRAA 53* 05/03/2014 1112   GFRAA 40* 03/06/2014 0959    No results found for: SPEP, UPEP  Lab Results  Component Value Date   WBC 2.4* 11/03/2014   NEUTROABS 1.8 11/03/2014   HGB 10.9* 11/03/2014   HCT 33.3* 11/03/2014   MCV 94.6 11/03/2014   PLT 65* 11/03/2014      Chemistry      Component Value Date/Time   NA 131* 11/03/2014 1443   NA 126* 05/03/2014 1112   K 4.5 11/03/2014 1443   K 4.8 05/03/2014 1112   CL 107 11/03/2014 1443   CL 97* 05/03/2014 1112   CO2 19* 11/03/2014 1443   CO2 22 05/03/2014 1112   BUN 22* 11/03/2014 1443   BUN 30* 05/03/2014 1112   CREATININE 1.43* 11/03/2014 1443   CREATININE 1.55* 05/03/2014 1112      Component Value Date/Time   CALCIUM 8.3* 11/03/2014 1443   CALCIUM 8.8* 05/03/2014 1112   ALKPHOS 124 11/03/2014 1443   ALKPHOS 105 05/03/2014 1112   AST 38 11/03/2014 1443   AST 27 05/03/2014 1112   ALT 20 11/03/2014 1443   ALT 14* 05/03/2014 1112   BILITOT 0.6 11/03/2014 1443   BILITOT 0.9 05/03/2014 1112       No results found for: LABCA2  No components found for: [id number]  No results for input(s): INR in the last 168 hours.     Component Value Date/Time   COLORURINE Yellow 02/16/2014 0900   APPEARANCEUR Clear 02/16/2014 0900   LABSPEC 1.013 02/16/2014 0900   PHURINE 6.0 02/16/2014 0900   GLUCOSEU Negative 02/16/2014 0900   HGBUR Negative 02/16/2014 0900   BILIRUBINUR Negative 02/16/2014 0900   KETONESUR Negative 02/16/2014 0900   PROTEINUR Negative 02/16/2014 0900   NITRITE Negative 02/16/2014 0900   LEUKOCYTESUR Negative 02/16/2014 0900    STUDIES: No results found.  ASSESSMENT:  Small cell carcinoma of lung, left. Neutropenia and Thrombocytopenia  PLAN:   1. Lung CA. Patient has completed 4 cycles of carboplatin plus VP-16. He continues  with some shortness of breath on exertion. Today on reexamination patient is feeling well  overall. CT of chest and abdomen was reported as having no signs of recurrent disease. We will order follow-up CT scan prior to next visit. 2. Neutropenia and thrombocytopenia. Patient with significant history of fluctuating white blood cells as well as platelets. He has known hepatosplenomegaly, attributed to fatty infiltration of the liver. He has cirrhosis and portal hypertension. He has a history of anemia status post GI bleed with AV malformations. 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 Discussed neutropenia and thrombocytopenia with Dr. Rogue Bussing, this is felt to be chronic and stable at this time, and we will continue to follow.  Patient to continue with follow-up in 2 months. Patient expressed understanding and was in agreement with this plan. He also understands that He can call clinic at any time with any questions, concerns, or complaints.    Evlyn Kanner, NP   11/03/2014 4:53 PM

## 2014-12-15 ENCOUNTER — Inpatient Hospital Stay: Payer: Medicare Other | Attending: Internal Medicine

## 2014-12-15 DIAGNOSIS — C3402 Malignant neoplasm of left main bronchus: Secondary | ICD-10-CM | POA: Diagnosis present

## 2014-12-15 DIAGNOSIS — Z452 Encounter for adjustment and management of vascular access device: Secondary | ICD-10-CM | POA: Insufficient documentation

## 2014-12-15 DIAGNOSIS — C801 Malignant (primary) neoplasm, unspecified: Secondary | ICD-10-CM

## 2014-12-15 MED ORDER — HEPARIN SOD (PORK) LOCK FLUSH 100 UNIT/ML IV SOLN
500.0000 [IU] | Freq: Once | INTRAVENOUS | Status: AC
  Administered 2014-12-15: 500 [IU] via INTRAVENOUS

## 2014-12-15 MED ORDER — SODIUM CHLORIDE 0.9 % IJ SOLN
10.0000 mL | Freq: Once | INTRAMUSCULAR | Status: AC
  Administered 2014-12-15: 10 mL via INTRAVENOUS
  Filled 2014-12-15: qty 10

## 2014-12-15 MED ORDER — HEPARIN SOD (PORK) LOCK FLUSH 100 UNIT/ML IV SOLN
INTRAVENOUS | Status: AC
  Filled 2014-12-15: qty 5

## 2015-01-03 ENCOUNTER — Ambulatory Visit
Admission: RE | Admit: 2015-01-03 | Discharge: 2015-01-03 | Disposition: A | Discharge status: Home / Self Care | Payer: Medicare Other | Source: Ambulatory Visit | Attending: Family Medicine | Admitting: Family Medicine

## 2015-01-03 DIAGNOSIS — K7460 Unspecified cirrhosis of liver: Secondary | ICD-10-CM | POA: Diagnosis not present

## 2015-01-03 DIAGNOSIS — R188 Other ascites: Secondary | ICD-10-CM | POA: Insufficient documentation

## 2015-01-03 DIAGNOSIS — J189 Pneumonia, unspecified organism: Secondary | ICD-10-CM | POA: Diagnosis not present

## 2015-01-03 DIAGNOSIS — C3490 Malignant neoplasm of unspecified part of unspecified bronchus or lung: Secondary | ICD-10-CM | POA: Diagnosis not present

## 2015-01-03 MED ORDER — IOHEXOL 300 MG/ML  SOLN
65.0000 mL | Freq: Once | INTRAMUSCULAR | Status: AC | PRN
  Administered 2015-01-03: 65 mL via INTRAVENOUS

## 2015-01-04 ENCOUNTER — Encounter: Payer: Self-pay | Admitting: *Deleted

## 2015-01-05 ENCOUNTER — Ambulatory Visit: Payer: Medicare Other | Admitting: Internal Medicine

## 2015-01-05 ENCOUNTER — Inpatient Hospital Stay (HOSPITAL_BASED_OUTPATIENT_CLINIC_OR_DEPARTMENT_OTHER): Payer: Medicare Other | Admitting: Internal Medicine

## 2015-01-05 ENCOUNTER — Other Ambulatory Visit: Payer: Self-pay | Admitting: *Deleted

## 2015-01-05 ENCOUNTER — Other Ambulatory Visit: Payer: Medicare Other

## 2015-01-05 ENCOUNTER — Inpatient Hospital Stay: Payer: Medicare Other | Attending: Internal Medicine

## 2015-01-05 VITALS — BP 160/95 | HR 90 | Temp 96.9°F | Resp 20 | Wt 252.6 lb

## 2015-01-05 DIAGNOSIS — Z8679 Personal history of other diseases of the circulatory system: Secondary | ICD-10-CM | POA: Insufficient documentation

## 2015-01-05 DIAGNOSIS — R188 Other ascites: Secondary | ICD-10-CM | POA: Diagnosis not present

## 2015-01-05 DIAGNOSIS — C3490 Malignant neoplasm of unspecified part of unspecified bronchus or lung: Secondary | ICD-10-CM

## 2015-01-05 DIAGNOSIS — R140 Abdominal distension (gaseous): Secondary | ICD-10-CM | POA: Diagnosis not present

## 2015-01-05 DIAGNOSIS — C3412 Malignant neoplasm of upper lobe, left bronchus or lung: Secondary | ICD-10-CM | POA: Insufficient documentation

## 2015-01-05 DIAGNOSIS — Z79899 Other long term (current) drug therapy: Secondary | ICD-10-CM | POA: Insufficient documentation

## 2015-01-05 DIAGNOSIS — R161 Splenomegaly, not elsewhere classified: Secondary | ICD-10-CM

## 2015-01-05 DIAGNOSIS — R918 Other nonspecific abnormal finding of lung field: Secondary | ICD-10-CM | POA: Insufficient documentation

## 2015-01-05 DIAGNOSIS — Z9221 Personal history of antineoplastic chemotherapy: Secondary | ICD-10-CM

## 2015-01-05 DIAGNOSIS — K7460 Unspecified cirrhosis of liver: Secondary | ICD-10-CM | POA: Diagnosis not present

## 2015-01-05 DIAGNOSIS — N2889 Other specified disorders of kidney and ureter: Secondary | ICD-10-CM

## 2015-01-05 DIAGNOSIS — R7989 Other specified abnormal findings of blood chemistry: Secondary | ICD-10-CM | POA: Insufficient documentation

## 2015-01-05 DIAGNOSIS — D696 Thrombocytopenia, unspecified: Secondary | ICD-10-CM | POA: Insufficient documentation

## 2015-01-05 DIAGNOSIS — Z8719 Personal history of other diseases of the digestive system: Secondary | ICD-10-CM

## 2015-01-05 DIAGNOSIS — J449 Chronic obstructive pulmonary disease, unspecified: Secondary | ICD-10-CM | POA: Insufficient documentation

## 2015-01-05 DIAGNOSIS — E785 Hyperlipidemia, unspecified: Secondary | ICD-10-CM

## 2015-01-05 DIAGNOSIS — D509 Iron deficiency anemia, unspecified: Secondary | ICD-10-CM | POA: Diagnosis not present

## 2015-01-05 DIAGNOSIS — Z794 Long term (current) use of insulin: Secondary | ICD-10-CM | POA: Insufficient documentation

## 2015-01-05 DIAGNOSIS — J8410 Pulmonary fibrosis, unspecified: Secondary | ICD-10-CM | POA: Insufficient documentation

## 2015-01-05 DIAGNOSIS — N62 Hypertrophy of breast: Secondary | ICD-10-CM | POA: Insufficient documentation

## 2015-01-05 DIAGNOSIS — Z452 Encounter for adjustment and management of vascular access device: Secondary | ICD-10-CM | POA: Insufficient documentation

## 2015-01-05 DIAGNOSIS — I10 Essential (primary) hypertension: Secondary | ICD-10-CM | POA: Diagnosis not present

## 2015-01-05 DIAGNOSIS — D61818 Other pancytopenia: Secondary | ICD-10-CM | POA: Insufficient documentation

## 2015-01-05 DIAGNOSIS — E119 Type 2 diabetes mellitus without complications: Secondary | ICD-10-CM

## 2015-01-05 DIAGNOSIS — Z87891 Personal history of nicotine dependence: Secondary | ICD-10-CM | POA: Insufficient documentation

## 2015-01-05 DIAGNOSIS — M7989 Other specified soft tissue disorders: Secondary | ICD-10-CM

## 2015-01-05 DIAGNOSIS — C3492 Malignant neoplasm of unspecified part of left bronchus or lung: Secondary | ICD-10-CM

## 2015-01-05 DIAGNOSIS — K7469 Other cirrhosis of liver: Secondary | ICD-10-CM

## 2015-01-05 DIAGNOSIS — J90 Pleural effusion, not elsewhere classified: Secondary | ICD-10-CM | POA: Diagnosis not present

## 2015-01-05 DIAGNOSIS — I2510 Atherosclerotic heart disease of native coronary artery without angina pectoris: Secondary | ICD-10-CM | POA: Insufficient documentation

## 2015-01-05 LAB — CBC WITH DIFFERENTIAL/PLATELET
BASOS PCT: 1 %
Basophils Absolute: 0 10*3/uL (ref 0–0.1)
EOS ABS: 0.1 10*3/uL (ref 0–0.7)
Eosinophils Relative: 3 %
HCT: 33.5 % — ABNORMAL LOW (ref 40.0–52.0)
HEMOGLOBIN: 10.8 g/dL — AB (ref 13.0–18.0)
Lymphocytes Relative: 14 %
Lymphs Abs: 0.3 10*3/uL — ABNORMAL LOW (ref 1.0–3.6)
MCH: 30.1 pg (ref 26.0–34.0)
MCHC: 32.2 g/dL (ref 32.0–36.0)
MCV: 93.6 fL (ref 80.0–100.0)
Monocytes Absolute: 0.2 10*3/uL (ref 0.2–1.0)
Monocytes Relative: 10 %
NEUTROS PCT: 72 %
Neutro Abs: 1.7 10*3/uL (ref 1.4–6.5)
Platelets: 71 10*3/uL — ABNORMAL LOW (ref 150–440)
RBC: 3.58 MIL/uL — AB (ref 4.40–5.90)
RDW: 18.5 % — ABNORMAL HIGH (ref 11.5–14.5)
WBC: 2.3 10*3/uL — AB (ref 3.8–10.6)

## 2015-01-05 LAB — COMPREHENSIVE METABOLIC PANEL
ALBUMIN: 3.4 g/dL — AB (ref 3.5–5.0)
ALK PHOS: 133 U/L — AB (ref 38–126)
ALT: 27 U/L (ref 17–63)
ANION GAP: 7 (ref 5–15)
AST: 43 U/L — AB (ref 15–41)
BUN: 22 mg/dL — ABNORMAL HIGH (ref 6–20)
CALCIUM: 8.7 mg/dL — AB (ref 8.9–10.3)
CO2: 20 mmol/L — AB (ref 22–32)
CREATININE: 1.11 mg/dL (ref 0.61–1.24)
Chloride: 103 mmol/L (ref 101–111)
GFR calc Af Amer: >60 mL/min (ref >60)
GFR calc non Af Amer: >60 mL/min (ref >60)
GLUCOSE: 140 mg/dL — AB (ref 65–99)
Potassium: 4.3 mmol/L (ref 3.5–5.1)
SODIUM: 130 mmol/L — AB (ref 135–145)
Total Bilirubin: 0.6 mg/dL (ref 0.3–1.2)
Total Protein: 7 g/dL (ref 6.5–8.1)

## 2015-01-05 MED ORDER — CEPHALEXIN 500 MG PO CAPS: 500.0000 mg | ORAL_CAPSULE | Freq: Three times a day (TID) | ORAL | Status: DC

## 2015-01-05 NOTE — Progress Notes (Signed)
[facility] OFFICE PROGRESS NOTE  Patient Care Team: Derinda Late, MD as PCP - General (Family Medicine)   SUMMARY OF ONCOLOGIC HISTORY:  # July 2015- SMALL CELL LUL [limited stage] s/p RT-Carbo-Etop x 4; DEC 2016- CT-Stable LUL mass/scar;   # DEC 2016- new RLL ? Atelectasis vs Pneumonia  # Pancytopenia/ Cirrhosis/Spleenomegaly  INTERVAL HISTORY:  This is my first interaction with the patient since I joined the practice September 2016. I reviewed the patient's prior charts/pertinent labs/imaging in detail; findings are summarized above.   69 year old male patient with above history of limited stage small cell lung cancer is currently here to review the results of his restaging CAT scan. Patient has intermittent cough however no fevers. Has chronic shortness of breath not any worse. He denies any unusual chest pain.  Patient has chronic abdominal distention not any worse. Chronic mild swelling in the legs again not any worse. His activity is fairly limited at home.   REVIEW OF SYSTEMS:  A complete 10 point review of system is done which is negative except mentioned above/history of present illness.   PAST MEDICAL HISTORY :  Past Medical History  Diagnosis Date  . Cataracts, both eyes   . CHF (congestive heart failure) ([place])   . COPD (chronic obstructive pulmonary disease) ([place])   . Detached retina   . Diabetes ([place])   . HTN (hypertension)   . Pleural effusion   . Hyperlipidemia   . IDA (iron deficiency anemia)   . Thrombocytopenia ([place])   . Small cell lung cancer ([place])   . Splenomegaly   . Azotemia   . GI bleed   . Cirrhosis ([place])   . Nephrotic range proteinuria   . Renal insufficiency   . Small cell carcinoma of lung ([place]) 07/27/2014  . History of chemotherapy     finished July 2015-4 cycles of carbo/taxol  . Ascites     PAST SURGICAL HISTORY :   Past Surgical History  Procedure Laterality Date  . Fistula repair    . Laminectomy    . Cardiac  stents    . Colonoscopy  12/18/12    FAMILY HISTORY :  No family history on file.  SOCIAL HISTORY:   Social History  Substance Use Topics  . Smoking status: Former Research scientist (life sciences)  . Smokeless tobacco: Not on file  . Alcohol Use: No    ALLERGIES:  is allergic to no known allergies.  MEDICATIONS:  Current Outpatient Prescriptions  Medication Sig Dispense Refill  . allopurinol (ZYLOPRIM) 100 MG tablet Take 100 mg by mouth daily.    . clopidogrel (PLAVIX) 75 MG tablet Take 75 mg by mouth daily.    . Ferrous Fum-Iron Polysacch-FA 162-115.2-1 MG CAPS Take 1 capsule by mouth 1 day or 1 dose.    . furosemide (LASIX) 40 MG tablet Take 40 mg by mouth 2 (two) times daily.     Marland Kitchen GENERLAC 10 GM/15ML SOLN TK 30 ML PO BID  5  . Glucose Blood (BL TEST STRIP PACK VI) Use 4 (four) times daily. One Touch Ultra Test strip (blood sugar diagnostic)   - Use 1 strip via meter four times a day    . insulin lispro (HUMALOG) 100 UNIT/ML injection Inject 100 Units into the skin once. Once daily through Insulin pump; based on sliding scale sugar    . Insulin Syringe-Needle U-100 (INSULIN SYRINGE 1CC/30GX5/16") 30G X 5/16" 1 ML MISC Inject 1 Syringe into the skin 3 (three) times daily.    Marland Kitchen  lactulose (CHRONULAC) 10 GM/15ML solution Take 30 g by mouth 2 (two) times daily as needed for mild constipation.    . metoprolol tartrate (LOPRESSOR) 25 MG tablet Take 12.5 mg by mouth 1 day or 1 dose.    . Multiple Vitamins-Minerals (CENTRUM ADULTS) TABS Take 1 tablet by mouth 1 day or 1 dose.    . nitroGLYCERIN (NITRODUR - DOSED IN MG/24 HR) 0.4 mg/hr patch Place 0.4 mg onto the skin daily.    . nitroGLYCERIN (NITROSTAT) 0.3 MG SL tablet Place 0.3 mg under the tongue every 5 (five) minutes as needed for chest pain.    Marland Kitchen omeprazole (PRILOSEC) 20 MG capsule Take 20 mg by mouth daily.  3  . pantoprazole (PROTONIX) 40 MG tablet Take 40 mg by mouth daily.    . polyethylene glycol (MIRALAX / GLYCOLAX) packet Take 17 g by mouth daily as  needed for mild constipation.    . promethazine (PHENERGAN) 25 MG tablet Take 25 mg by mouth every 8 (eight) hours as needed for nausea or vomiting.    Marland Kitchen spironolactone (ALDACTONE) 100 MG tablet Take 100 mg by mouth as needed. For swelling  0  . sucralfate (CARAFATE) 1 G tablet Take 1 g by mouth 2 (two) times daily before a meal.    . tamsulosin (FLOMAX) 0.4 MG CAPS capsule Take 0.4 mg by mouth 1 day or 1 dose.    Marland Kitchen TANDEM 162-115.2 MG CAPS TK ONE C PO QD  3   No current facility-administered medications for this visit.   Facility-Administered Medications Ordered in Other Visits  Medication Dose Route Frequency Provider Last Rate Last Dose  . sodium chloride 0.9 % injection 10 mL  10 mL Intravenous PRN Evlyn Kanner, NP   10 mL at 07/14/14 1125    PHYSICAL EXAMINATION: ECOG PERFORMANCE STATUS: 2 - Symptomatic, <50% confined to bed  BP 160/95 mmHg  Pulse 90  Temp(Src) 96.9 F (36.1 C) (Tympanic)  Resp 20  Wt 252 lb 10.4 oz (114.6 kg)  SpO2 98%  Filed Weights   01/05/15 1028  Weight: 252 lb 10.4 oz (114.6 kg)    GENERAL: Well-nourished well-developed; Alert, no distress and comfortable.  Accompanied by wife/daughter.  EYES: no pallor or icterus OROPHARYNX: no thrush or ulceration; poor dentition  NECK: supple, no masses felt LYMPH:  no palpable lymphadenopathy in the cervical, axillary or inguinal regions LUNGS: clear to auscultation and  No wheeze or crackles HEART/CVS: regular rate & rhythm and no murmurs;1+ lower extremity edema ABDOMEN:abdomen soft, non-tender and normal bowel sounds; positive for splenomegaly. Musculoskeletal:no cyanosis of digits and no clubbing  PSYCH: alert & oriented x 3 with fluent speech NEURO: no focal motor/sensory deficits SKIN:  no rashes or significant lesions  LABORATORY DATA:  I have reviewed the data as listed    Component Value Date/Time   NA 130* 01/05/2015 1016   NA 126* 05/03/2014 1112   K 4.3 01/05/2015 1016   K 4.8  05/03/2014 1112   CL 103 01/05/2015 1016   CL 97* 05/03/2014 1112   CO2 20* 01/05/2015 1016   CO2 22 05/03/2014 1112   GLUCOSE 140* 01/05/2015 1016   GLUCOSE 93 05/03/2014 1112   BUN 22* 01/05/2015 1016   BUN 30* 05/03/2014 1112   CREATININE 1.11 01/05/2015 1016   CREATININE 1.55* 05/03/2014 1112   CALCIUM 8.7* 01/05/2015 1016   CALCIUM 8.8* 05/03/2014 1112   PROT 7.0 01/05/2015 1016   PROT 7.2 05/03/2014 1112   ALBUMIN 3.4*  01/05/2015 1016   ALBUMIN 3.5 05/03/2014 1112   AST 43* 01/05/2015 1016   AST 27 05/03/2014 1112   ALT 27 01/05/2015 1016   ALT 14* 05/03/2014 1112   ALKPHOS 133* 01/05/2015 1016   ALKPHOS 105 05/03/2014 1112   BILITOT 0.6 01/05/2015 1016   BILITOT 0.9 05/03/2014 1112   GFRNONAA >60 01/05/2015 1016   GFRNONAA 46* 05/03/2014 1112   GFRNONAA 33* 03/06/2014 0959   GFRAA >60 01/05/2015 1016   GFRAA 53* 05/03/2014 1112   GFRAA 40* 03/06/2014 0959    No results found for: SPEP, UPEP  Lab Results  Component Value Date   WBC 2.3* 01/05/2015   NEUTROABS 1.7 01/05/2015   HGB 10.8* 01/05/2015   HCT 33.5* 01/05/2015   MCV 93.6 01/05/2015   PLT 71* 01/05/2015      Chemistry      Component Value Date/Time   NA 130* 01/05/2015 1016   NA 126* 05/03/2014 1112   K 4.3 01/05/2015 1016   K 4.8 05/03/2014 1112   CL 103 01/05/2015 1016   CL 97* 05/03/2014 1112   CO2 20* 01/05/2015 1016   CO2 22 05/03/2014 1112   BUN 22* 01/05/2015 1016   BUN 30* 05/03/2014 1112   CREATININE 1.11 01/05/2015 1016   CREATININE 1.55* 05/03/2014 1112      Component Value Date/Time   CALCIUM 8.7* 01/05/2015 1016   CALCIUM 8.8* 05/03/2014 1112   ALKPHOS 133* 01/05/2015 1016   ALKPHOS 105 05/03/2014 1112   AST 43* 01/05/2015 1016   AST 27 05/03/2014 1112   ALT 27 01/05/2015 1016   ALT 14* 05/03/2014 1112   BILITOT 0.6 01/05/2015 1016   BILITOT 0.9 05/03/2014 1112       RADIOGRAPHIC STUDIES: I have personally reviewed the radiological images as listed and agreed with  the findings in the report. Ct Chest W Contrast  01/03/2015  CLINICAL DATA:  Subsequent encounter for small-cell lung cancer. EXAM: CT CHEST WITH CONTRAST TECHNIQUE: Multidetector CT imaging of the chest was performed during intravenous contrast administration. CONTRAST:  37m OMNIPAQUE IOHEXOL 300 MG/ML  SOLN COMPARISON:  07/26/2014. FINDINGS: Mediastinum / Lymph Nodes: There is no axillary lymphadenopathy. Right Port-A-Cath tip is positioned at the mid SVC level. Scattered small mediastinal lymph nodes are stable. No right hilar lymphadenopathy. Abnormal soft tissue attenuation with associated bronchiectasis in the left hilum and left suprahilar lung is stable, compatible with post radiation change. Heart size is normal. No pericardial effusion. Coronary artery calcification is noted. Lungs / Pleura: Right upper lobe calcified granuloma is stable. New area of focal airspace opacity in the posterior right lower lobe measuring 2.9 x 5.7 cm is probably atelectatic with pneumonia less likely consideration. As mentioned above, post treatment changes are noted in the medial left lung, stable in the interval. A small right pleural effusion is stable in the interval. Upper Abdomen: Nodular hepatic contour again noted, compatible with cirrhosis. Portal vein is patent. Spleen is incompletely visualized but appears mildly enlarged. Moderate volume ascites has progressed in the interval. MSK / Soft Tissues: Bone windows reveal no worrisome lytic or sclerotic osseous lesions. Bilateral gynecomastia again noted. IMPRESSION: 1. Stable appearance of post treatment changes in the medial left lung. No features to suggest recurrent disease. 2. Interval development of a focal opacity in the posterior right lower lobe is probably atelectatic with pneumonia being a possibility as well. Attention to this area on followup recommended. 3. Cirrhosis with interval increase in ascites. Electronically Signed   By:  Misty Stanley M.D.   On:  01/03/2015 13:05     ASSESSMENT & PLAN:   # Limited stage small cell lung cancer status post chemoradiation. Clinically no evidence of recurrence/progression noted. CT scan December 2016- stable left upper lobe changes noted; however right lower lobe changes noted [C discussion below].  # Right lower lobe posterior nodular density- atelectasis versus pneumonia. Clinically less likely recurrent malignancy. We'll start the patient on antibiotics for 7 days. Reevaluate with CT scan in approximately 2 months.  # Pancytopenia-likely secondary to cirrhosis/splenomegaly. Blood counts have been stable from June 2016. I did not think bone marrow biopsy is going to add much information.  # Cirrhosis- Karlene Lineman. Mild increase in ascites noted on CT scan. Recommend follow-up with GI. AFP ordered.  All questions were answered. The patient knows to call the clinic with any problems, questions or concerns. No barriers to learning was detected.  # 25 minutes face-to-face with the patient discussing the above plan of care; more than 50% of time spent on prognosis/ natural history; counseling and coordination.      Cammie Sickle, MD 01/05/2015 11:13 AM

## 2015-01-05 NOTE — Progress Notes (Signed)
Patient here today for CT results. 

## 2015-01-06 LAB — AFP, TUMOR MARKER (SERIAL): AFP TUMOR MARKER: 1.6 ng/mL (ref 0.0–8.3)

## 2015-01-26 ENCOUNTER — Inpatient Hospital Stay: Payer: Medicare Other

## 2015-01-26 DIAGNOSIS — C3492 Malignant neoplasm of unspecified part of left bronchus or lung: Secondary | ICD-10-CM

## 2015-01-26 DIAGNOSIS — C3412 Malignant neoplasm of upper lobe, left bronchus or lung: Secondary | ICD-10-CM | POA: Diagnosis not present

## 2015-01-26 MED ORDER — HEPARIN SOD (PORK) LOCK FLUSH 100 UNIT/ML IV SOLN
500.0000 [IU] | Freq: Once | INTRAVENOUS | Status: AC
  Administered 2015-01-26: 500 [IU] via INTRAVENOUS
  Filled 2015-01-26: qty 5

## 2015-01-26 MED ORDER — SODIUM CHLORIDE 0.9 % IJ SOLN
10.0000 mL | INTRAMUSCULAR | Status: DC | PRN
  Administered 2015-01-26: 10 mL via INTRAVENOUS
  Filled 2015-01-26: qty 10

## 2015-01-31 ENCOUNTER — Telehealth: Payer: Self-pay | Admitting: *Deleted

## 2015-01-31 NOTE — Telephone Encounter (Signed)
Spoke with Magda Paganini, NP. Patient may try otc musinex without the Tylenol or Delsym to see if symptoms improve. Wife states that she will have her husband try the mucinex to see if this will help. I instructed the patient's wife to call cancer center back if symptoms worsen and do not improve over the next several days.

## 2015-01-31 NOTE — Telephone Encounter (Signed)
Wife called. Dr. Rogue Bussing tx pt with 7 days of oral abx on 01/05/15 for possible pneumonia. Wife reports that her husband is c/o "chest congestion which worsens at night when lying down. Coughing up clear small to moderate thick sputum. Symptoms started 1 week ago and chest congestion and cough have since worsen Patient does not have any fever.  Wife requesting a cough suppressant and possible antibiotic for her husband. The patient has not tried any otc cough medication due to h/o cirrhosis of liver.  Pt uses [facility] on [address].  No known allergies.

## 2015-03-08 ENCOUNTER — Ambulatory Visit
Admission: RE | Admit: 2015-03-08 | Discharge: 2015-03-08 | Disposition: A | Discharge status: Home / Self Care | Payer: Medicare Other | Source: Ambulatory Visit | Attending: Internal Medicine | Admitting: Internal Medicine

## 2015-03-08 DIAGNOSIS — R161 Splenomegaly, not elsewhere classified: Secondary | ICD-10-CM | POA: Insufficient documentation

## 2015-03-08 DIAGNOSIS — C3492 Malignant neoplasm of unspecified part of left bronchus or lung: Secondary | ICD-10-CM | POA: Diagnosis not present

## 2015-03-08 DIAGNOSIS — J449 Chronic obstructive pulmonary disease, unspecified: Secondary | ICD-10-CM | POA: Insufficient documentation

## 2015-03-08 DIAGNOSIS — J9811 Atelectasis: Secondary | ICD-10-CM | POA: Diagnosis not present

## 2015-03-08 DIAGNOSIS — R05 Cough: Secondary | ICD-10-CM | POA: Insufficient documentation

## 2015-03-08 DIAGNOSIS — R188 Other ascites: Secondary | ICD-10-CM | POA: Diagnosis not present

## 2015-03-12 ENCOUNTER — Other Ambulatory Visit: Payer: Medicare Other

## 2015-03-12 ENCOUNTER — Ambulatory Visit: Payer: Medicare Other | Admitting: Internal Medicine

## 2015-03-12 ENCOUNTER — Inpatient Hospital Stay: Payer: Medicare Other

## 2015-03-12 ENCOUNTER — Inpatient Hospital Stay: Payer: Medicare Other | Attending: Internal Medicine

## 2015-03-12 ENCOUNTER — Inpatient Hospital Stay (HOSPITAL_BASED_OUTPATIENT_CLINIC_OR_DEPARTMENT_OTHER): Payer: Medicare Other | Admitting: Internal Medicine

## 2015-03-12 VITALS — BP 126/81 | HR 89 | Temp 96.3°F | Ht 70.0 in | Wt 237.0 lb

## 2015-03-12 DIAGNOSIS — R5383 Other fatigue: Secondary | ICD-10-CM | POA: Diagnosis not present

## 2015-03-12 DIAGNOSIS — Z8719 Personal history of other diseases of the digestive system: Secondary | ICD-10-CM | POA: Insufficient documentation

## 2015-03-12 DIAGNOSIS — I509 Heart failure, unspecified: Secondary | ICD-10-CM

## 2015-03-12 DIAGNOSIS — Z85118 Personal history of other malignant neoplasm of bronchus and lung: Secondary | ICD-10-CM | POA: Diagnosis present

## 2015-03-12 DIAGNOSIS — R188 Other ascites: Secondary | ICD-10-CM | POA: Diagnosis not present

## 2015-03-12 DIAGNOSIS — Z79899 Other long term (current) drug therapy: Secondary | ICD-10-CM | POA: Insufficient documentation

## 2015-03-12 DIAGNOSIS — G4700 Insomnia, unspecified: Secondary | ICD-10-CM | POA: Insufficient documentation

## 2015-03-12 DIAGNOSIS — R808 Other proteinuria: Secondary | ICD-10-CM

## 2015-03-12 DIAGNOSIS — I10 Essential (primary) hypertension: Secondary | ICD-10-CM | POA: Insufficient documentation

## 2015-03-12 DIAGNOSIS — D61818 Other pancytopenia: Secondary | ICD-10-CM | POA: Diagnosis not present

## 2015-03-12 DIAGNOSIS — Z95828 Presence of other vascular implants and grafts: Secondary | ICD-10-CM

## 2015-03-12 DIAGNOSIS — C3492 Malignant neoplasm of unspecified part of left bronchus or lung: Secondary | ICD-10-CM

## 2015-03-12 DIAGNOSIS — Z9221 Personal history of antineoplastic chemotherapy: Secondary | ICD-10-CM

## 2015-03-12 DIAGNOSIS — K7460 Unspecified cirrhosis of liver: Secondary | ICD-10-CM

## 2015-03-12 DIAGNOSIS — Z87891 Personal history of nicotine dependence: Secondary | ICD-10-CM | POA: Insufficient documentation

## 2015-03-12 DIAGNOSIS — Z794 Long term (current) use of insulin: Secondary | ICD-10-CM

## 2015-03-12 DIAGNOSIS — E119 Type 2 diabetes mellitus without complications: Secondary | ICD-10-CM

## 2015-03-12 DIAGNOSIS — R161 Splenomegaly, not elsewhere classified: Secondary | ICD-10-CM | POA: Insufficient documentation

## 2015-03-12 DIAGNOSIS — E785 Hyperlipidemia, unspecified: Secondary | ICD-10-CM

## 2015-03-12 DIAGNOSIS — J449 Chronic obstructive pulmonary disease, unspecified: Secondary | ICD-10-CM

## 2015-03-12 LAB — COMPREHENSIVE METABOLIC PANEL
ALBUMIN: 3.5 g/dL (ref 3.5–5.0)
ALT: 24 U/L (ref 17–63)
AST: 40 U/L (ref 15–41)
Alkaline Phosphatase: 140 U/L — ABNORMAL HIGH (ref 38–126)
Anion gap: 8 (ref 5–15)
BUN: 38 mg/dL — AB (ref 6–20)
CHLORIDE: 101 mmol/L (ref 101–111)
CO2: 20 mmol/L — AB (ref 22–32)
Calcium: 8.7 mg/dL — ABNORMAL LOW (ref 8.9–10.3)
Creatinine, Ser: 1.72 mg/dL — ABNORMAL HIGH (ref 0.61–1.24)
GFR calc Af Amer: 45 mL/min — ABNORMAL LOW (ref >60)
GFR calc non Af Amer: 39 mL/min — ABNORMAL LOW (ref >60)
GLUCOSE: 147 mg/dL — AB (ref 65–99)
POTASSIUM: 4 mmol/L (ref 3.5–5.1)
SODIUM: 129 mmol/L — AB (ref 135–145)
Total Bilirubin: 0.7 mg/dL (ref 0.3–1.2)
Total Protein: 7.4 g/dL (ref 6.5–8.1)

## 2015-03-12 LAB — CBC WITH DIFFERENTIAL/PLATELET
BASOS ABS: 0 10*3/uL (ref 0–0.1)
BASOS PCT: 1 %
EOS PCT: 3 %
Eosinophils Absolute: 0.1 10*3/uL (ref 0–0.7)
HCT: 33.5 % — ABNORMAL LOW (ref 40.0–52.0)
Hemoglobin: 11 g/dL — ABNORMAL LOW (ref 13.0–18.0)
Lymphocytes Relative: 8 %
Lymphs Abs: 0.2 10*3/uL — ABNORMAL LOW (ref 1.0–3.6)
MCH: 29.8 pg (ref 26.0–34.0)
MCHC: 32.9 g/dL (ref 32.0–36.0)
MCV: 90.4 fL (ref 80.0–100.0)
MONO ABS: 0.4 10*3/uL (ref 0.2–1.0)
Monocytes Relative: 14 %
Neutro Abs: 2 10*3/uL (ref 1.4–6.5)
Neutrophils Relative %: 74 %
PLATELETS: 79 10*3/uL — AB (ref 150–440)
RBC: 3.7 MIL/uL — ABNORMAL LOW (ref 4.40–5.90)
RDW: 18.6 % — AB (ref 11.5–14.5)
WBC: 2.7 10*3/uL — AB (ref 3.8–10.6)

## 2015-03-12 MED ORDER — SODIUM CHLORIDE 0.9% FLUSH
10.0000 mL | INTRAVENOUS | Status: DC | PRN
  Administered 2015-03-12: 10 mL via INTRAVENOUS
  Filled 2015-03-12: qty 10

## 2015-03-12 MED ORDER — HEPARIN SOD (PORK) LOCK FLUSH 100 UNIT/ML IV SOLN
500.0000 [IU] | Freq: Once | INTRAVENOUS | Status: AC
  Administered 2015-03-12: 500 [IU] via INTRAVENOUS

## 2015-03-12 NOTE — Progress Notes (Signed)
[place] OFFICE PROGRESS NOTE  Patient Care Team: Derinda Late, MD as PCP - General (Family Medicine)   SUMMARY OF ONCOLOGIC HISTORY:  # July 2015- SMALL CELL LUL [limited stage] s/p RT-Carbo-Etop x 4; JAN 2017- CT-Stable LUL mass/scar;   # DEC 2016- Right LLL [~1.6cm]- likely nodular atelectasis  # PCI Brain [Dr.Crystal; 2015]  # Pancytopenia/ Cirrhosis-ascites/Spleenomegaly- NASH  INTERVAL HISTORY:  70 year old male patient with above history of limited stage small cell lung cancer is currently here to review the results of his restaging CAT scan. Patient denies any unusual cough or shortness of breath. He does have chronic shortness of breath which he attributes to abdominal distention/ascites. He has chronic fatigue.  He does complain of difficulty sleeping at nighttime. Otherwise denies any headaches or vision changes. His activity is fairly limited at home.  REVIEW OF SYSTEMS:  A complete 10 point review of system is done which is negative except mentioned above/history of present illness.   PAST MEDICAL HISTORY :  Past Medical History  Diagnosis Date  . Cataracts, both eyes   . CHF (congestive heart failure) ([place])   . COPD (chronic obstructive pulmonary disease) ([place])   . Detached retina   . Diabetes ([place])   . HTN (hypertension)   . Pleural effusion   . Hyperlipidemia   . IDA (iron deficiency anemia)   . Thrombocytopenia ([place])   . Small cell lung cancer ([place])   . Splenomegaly   . Azotemia   . GI bleed   . Cirrhosis ([place])   . Nephrotic range proteinuria   . Renal insufficiency   . Small cell carcinoma of lung ([place]) 07/27/2014  . History of chemotherapy     finished July 2015-4 cycles of carbo/taxol  . Ascites     PAST SURGICAL HISTORY :   Past Surgical History  Procedure Laterality Date  . Fistula repair    . Laminectomy    . Cardiac stents    . Colonoscopy  12/18/12    FAMILY HISTORY :  No family history on file.  SOCIAL HISTORY:    Social History  Substance Use Topics  . Smoking status: Former Research scientist (life sciences)  . Smokeless tobacco: Not on file  . Alcohol Use: No    ALLERGIES:  is allergic to no known allergies.  MEDICATIONS:  Current Outpatient Prescriptions  Medication Sig Dispense Refill  . allopurinol (ZYLOPRIM) 100 MG tablet Take 100 mg by mouth daily.    . clopidogrel (PLAVIX) 75 MG tablet Take 75 mg by mouth daily.    . Ferrous Fum-Iron Polysacch-FA 162-115.2-1 MG CAPS Take 1 capsule by mouth 1 day or 1 dose.    . furosemide (LASIX) 40 MG tablet Take 40 mg by mouth 2 (two) times daily.     Marland Kitchen GENERLAC 10 GM/15ML SOLN TK 30 ML PO BID  5  . Glucose Blood (BL TEST STRIP PACK VI) Use 4 (four) times daily. One Touch Ultra Test strip (blood sugar diagnostic)   - Use 1 strip via meter four times a day    . insulin lispro (HUMALOG) 100 UNIT/ML injection Inject 100 Units into the skin once. Once daily through Insulin pump; based on sliding scale sugar    . Insulin Syringe-Needle U-100 (INSULIN SYRINGE 1CC/30GX5/16") 30G X 5/16" 1 ML MISC Inject 1 Syringe into the skin 3 (three) times daily.    Marland Kitchen lactulose (CHRONULAC) 10 GM/15ML solution Take 30 g by mouth 2 (two) times daily as needed for mild constipation.    Marland Kitchen  metoprolol tartrate (LOPRESSOR) 25 MG tablet Take 12.5 mg by mouth 1 day or 1 dose.    . Multiple Vitamins-Minerals (CENTRUM ADULTS) TABS Take 1 tablet by mouth 1 day or 1 dose.    . nitroGLYCERIN (NITRODUR - DOSED IN MG/24 HR) 0.4 mg/hr patch Place 0.4 mg onto the skin daily.    . nitroGLYCERIN (NITROSTAT) 0.3 MG SL tablet Place 0.3 mg under the tongue every 5 (five) minutes as needed for chest pain.    Marland Kitchen omeprazole (PRILOSEC) 20 MG capsule Take 20 mg by mouth daily.  3  . pantoprazole (PROTONIX) 40 MG tablet Take 40 mg by mouth daily.    . polyethylene glycol (MIRALAX / GLYCOLAX) packet Take 17 g by mouth daily as needed for mild constipation.    . promethazine (PHENERGAN) 25 MG tablet Take 25 mg by mouth every 8  (eight) hours as needed for nausea or vomiting.    Marland Kitchen spironolactone (ALDACTONE) 100 MG tablet Take 100 mg by mouth as needed. For swelling  0  . sucralfate (CARAFATE) 1 G tablet Take 1 g by mouth 2 (two) times daily before a meal.    . tamsulosin (FLOMAX) 0.4 MG CAPS capsule Take 0.4 mg by mouth 1 day or 1 dose.    Marland Kitchen TANDEM 162-115.2 MG CAPS TK ONE C PO QD  3   No current facility-administered medications for this visit.   Facility-Administered Medications Ordered in Other Visits  Medication Dose Route Frequency Provider Last Rate Last Dose  . sodium chloride 0.9 % injection 10 mL  10 mL Intravenous PRN Evlyn Kanner, NP   10 mL at 07/14/14 1125    PHYSICAL EXAMINATION: ECOG PERFORMANCE STATUS: 2 - Symptomatic, <50% confined to bed  BP 126/81 mmHg  Pulse 89  Temp(Src) 96.3 F (35.7 C) (Tympanic)  Ht '5\' 10"'$  (1.778 m)  Wt 236 lb 15.9 oz (107.5 kg)  BMI 34.01 kg/m2  SpO2 100%  Filed Weights   03/12/15 1113  Weight: 236 lb 15.9 oz (107.5 kg)    GENERAL: Well-nourished well-developed; Alert, no distress and comfortable.  Accompanied by wife/daughters.   EYES: no pallor or icterus OROPHARYNX: no thrush or ulceration; poor dentition  NECK: supple, no masses felt LYMPH:  no palpable lymphadenopathy in the cervical, axillary or inguinal regions LUNGS: clear to auscultation and  No wheeze or crackles HEART/CVS: regular rate & rhythm and no murmurs; no lower extremity edema ABDOMEN:abdomen soft, non-tender and normal bowel sounds; positive for splenomegaly. Musculoskeletal:no cyanosis of digits and no clubbing  PSYCH: alert & oriented x 3 with fluent speech NEURO: no focal motor/sensory deficits SKIN:  no rashes or significant lesions  LABORATORY DATA:  I have reviewed the data as listed    Component Value Date/Time   NA 129* 03/12/2015 1053   NA 126* 05/03/2014 1112   K 4.0 03/12/2015 1053   K 4.8 05/03/2014 1112   CL 101 03/12/2015 1053   CL 97* 05/03/2014 1112   CO2  20* 03/12/2015 1053   CO2 22 05/03/2014 1112   GLUCOSE 147* 03/12/2015 1053   GLUCOSE 93 05/03/2014 1112   BUN 38* 03/12/2015 1053   BUN 30* 05/03/2014 1112   CREATININE 1.72* 03/12/2015 1053   CREATININE 1.55* 05/03/2014 1112   CALCIUM 8.7* 03/12/2015 1053   CALCIUM 8.8* 05/03/2014 1112   PROT 7.4 03/12/2015 1053   PROT 7.2 05/03/2014 1112   ALBUMIN 3.5 03/12/2015 1053   ALBUMIN 3.5 05/03/2014 1112   AST 40 03/12/2015 1053  AST 27 05/03/2014 1112   ALT 24 03/12/2015 1053   ALT 14* 05/03/2014 1112   ALKPHOS 140* 03/12/2015 1053   ALKPHOS 105 05/03/2014 1112   BILITOT 0.7 03/12/2015 1053   BILITOT 0.9 05/03/2014 1112   GFRNONAA 39* 03/12/2015 1053   GFRNONAA 46* 05/03/2014 1112   GFRNONAA 33* 03/06/2014 0959   GFRAA 45* 03/12/2015 1053   GFRAA 53* 05/03/2014 1112   GFRAA 40* 03/06/2014 0959    No results found for: SPEP, UPEP  Lab Results  Component Value Date   WBC 2.7* 03/12/2015   NEUTROABS 2.0 03/12/2015   HGB 11.0* 03/12/2015   HCT 33.5* 03/12/2015   MCV 90.4 03/12/2015   PLT 79* 03/12/2015      Chemistry      Component Value Date/Time   NA 129* 03/12/2015 1053   NA 126* 05/03/2014 1112   K 4.0 03/12/2015 1053   K 4.8 05/03/2014 1112   CL 101 03/12/2015 1053   CL 97* 05/03/2014 1112   CO2 20* 03/12/2015 1053   CO2 22 05/03/2014 1112   BUN 38* 03/12/2015 1053   BUN 30* 05/03/2014 1112   CREATININE 1.72* 03/12/2015 1053   CREATININE 1.55* 05/03/2014 1112      Component Value Date/Time   CALCIUM 8.7* 03/12/2015 1053   CALCIUM 8.8* 05/03/2014 1112   ALKPHOS 140* 03/12/2015 1053   ALKPHOS 105 05/03/2014 1112   AST 40 03/12/2015 1053   AST 27 05/03/2014 1112   ALT 24 03/12/2015 1053   ALT 14* 05/03/2014 1112   BILITOT 0.7 03/12/2015 1053   BILITOT 0.9 05/03/2014 1112        ASSESSMENT & PLAN:   # Limited stage small cell lung cancer status post chemoradiation. Clinically no evidence of recurrence/progression noted. CT scan JAN 2017- stable  left upper lobe changes noted; & right lower lobe-likely nodular atelectasis noted [C discussion below].  # Right lower lobe posterior nodular density- nodular atelectasis likely on a clinical basis. Patient has no fevers; left likely to be malignant given the steady/not growing. I would not recommend a biopsy at this time. I reviewed the images myself reviewed the images with the patient and family. Recommend CT chest without contrast in 4 months.  # Small cell lung cancer limited stage status post PCI. Recommend getting the MRI brain with contrast in approximately 4 months.  # Pancytopenia-likely secondary to cirrhosis/splenomegaly. Blood counts have been stable from June 2016. White count is slightly low at 2.3/hemoglobin steady at 11 platelets stable at 70s.  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 Patient has appointment to GI/Dr. Gustavo Lah. Patient was given a copy of his labs.  # Difficulty sleeping at night- question related to encephalopathy. Defer to PCP/GI.   # 25 minutes face-to-face with the patient discussing the above plan of care; more than 50% of time spent on prognosis/ natural history; counseling and coordination.      Cammie Sickle, MD 03/12/2015 11:22 AM

## 2015-03-13 ENCOUNTER — Other Ambulatory Visit: Payer: Self-pay | Admitting: Gastroenterology

## 2015-03-13 DIAGNOSIS — K7460 Unspecified cirrhosis of liver: Secondary | ICD-10-CM

## 2015-03-19 ENCOUNTER — Ambulatory Visit: Payer: Medicare Other

## 2015-03-19 ENCOUNTER — Ambulatory Visit
Admission: RE | Admit: 2015-03-19 | Discharge: 2015-03-19 | Disposition: A | Discharge status: Home / Self Care | Payer: Medicare Other | Source: Ambulatory Visit | Attending: Gastroenterology | Admitting: Gastroenterology

## 2015-03-19 DIAGNOSIS — J90 Pleural effusion, not elsewhere classified: Secondary | ICD-10-CM | POA: Insufficient documentation

## 2015-03-19 DIAGNOSIS — Z9049 Acquired absence of other specified parts of digestive tract: Secondary | ICD-10-CM | POA: Insufficient documentation

## 2015-03-19 DIAGNOSIS — K7460 Unspecified cirrhosis of liver: Secondary | ICD-10-CM | POA: Diagnosis present

## 2015-03-19 DIAGNOSIS — R161 Splenomegaly, not elsewhere classified: Secondary | ICD-10-CM | POA: Diagnosis not present

## 2015-03-19 DIAGNOSIS — R188 Other ascites: Secondary | ICD-10-CM | POA: Diagnosis not present

## 2015-04-23 ENCOUNTER — Inpatient Hospital Stay: Payer: Medicare Other | Attending: Internal Medicine

## 2015-04-23 DIAGNOSIS — Z85118 Personal history of other malignant neoplasm of bronchus and lung: Secondary | ICD-10-CM | POA: Diagnosis present

## 2015-04-23 DIAGNOSIS — Z452 Encounter for adjustment and management of vascular access device: Secondary | ICD-10-CM | POA: Diagnosis not present

## 2015-04-23 DIAGNOSIS — Z95828 Presence of other vascular implants and grafts: Secondary | ICD-10-CM

## 2015-04-23 MED ORDER — SODIUM CHLORIDE 0.9% FLUSH
10.0000 mL | INTRAVENOUS | Status: DC | PRN
  Administered 2015-04-23: 10 mL via INTRAVENOUS
  Filled 2015-04-23: qty 10

## 2015-04-23 MED ORDER — HEPARIN SOD (PORK) LOCK FLUSH 100 UNIT/ML IV SOLN
500.0000 [IU] | Freq: Once | INTRAVENOUS | Status: AC
  Administered 2015-04-23: 500 [IU] via INTRAVENOUS

## 2015-05-09 ENCOUNTER — Emergency Department: Payer: Medicare Other

## 2015-05-09 ENCOUNTER — Inpatient Hospital Stay
Admission: EM | Admit: 2015-05-09 | Discharge: 2015-05-12 | DRG: 378 | Disposition: A | Discharge status: Home Health | Payer: Medicare Other | Attending: Internal Medicine | Admitting: Internal Medicine

## 2015-05-09 ENCOUNTER — Encounter: Payer: Self-pay | Admitting: Intensive Care

## 2015-05-09 ENCOUNTER — Telehealth: Payer: Self-pay | Admitting: *Deleted

## 2015-05-09 DIAGNOSIS — K7581 Nonalcoholic steatohepatitis (NASH): Secondary | ICD-10-CM | POA: Diagnosis present

## 2015-05-09 DIAGNOSIS — D696 Thrombocytopenia, unspecified: Secondary | ICD-10-CM | POA: Diagnosis present

## 2015-05-09 DIAGNOSIS — M109 Gout, unspecified: Secondary | ICD-10-CM | POA: Diagnosis present

## 2015-05-09 DIAGNOSIS — E669 Obesity, unspecified: Secondary | ICD-10-CM | POA: Diagnosis present

## 2015-05-09 DIAGNOSIS — Z85118 Personal history of other malignant neoplasm of bronchus and lung: Secondary | ICD-10-CM

## 2015-05-09 DIAGNOSIS — K922 Gastrointestinal hemorrhage, unspecified: Secondary | ICD-10-CM | POA: Diagnosis present

## 2015-05-09 DIAGNOSIS — N183 Chronic kidney disease, stage 3 (moderate): Secondary | ICD-10-CM | POA: Diagnosis present

## 2015-05-09 DIAGNOSIS — E785 Hyperlipidemia, unspecified: Secondary | ICD-10-CM | POA: Diagnosis present

## 2015-05-09 DIAGNOSIS — I252 Old myocardial infarction: Secondary | ICD-10-CM | POA: Diagnosis not present

## 2015-05-09 DIAGNOSIS — Z79899 Other long term (current) drug therapy: Secondary | ICD-10-CM | POA: Diagnosis not present

## 2015-05-09 DIAGNOSIS — Z9119 Patient's noncompliance with other medical treatment and regimen: Secondary | ICD-10-CM | POA: Diagnosis not present

## 2015-05-09 DIAGNOSIS — K766 Portal hypertension: Secondary | ICD-10-CM | POA: Diagnosis present

## 2015-05-09 DIAGNOSIS — N179 Acute kidney failure, unspecified: Secondary | ICD-10-CM | POA: Diagnosis present

## 2015-05-09 DIAGNOSIS — D72819 Decreased white blood cell count, unspecified: Secondary | ICD-10-CM | POA: Diagnosis not present

## 2015-05-09 DIAGNOSIS — R531 Weakness: Secondary | ICD-10-CM

## 2015-05-09 DIAGNOSIS — I130 Hypertensive heart and chronic kidney disease with heart failure and stage 1 through stage 4 chronic kidney disease, or unspecified chronic kidney disease: Secondary | ICD-10-CM | POA: Diagnosis present

## 2015-05-09 DIAGNOSIS — K7460 Unspecified cirrhosis of liver: Secondary | ICD-10-CM | POA: Diagnosis not present

## 2015-05-09 DIAGNOSIS — E1122 Type 2 diabetes mellitus with diabetic chronic kidney disease: Secondary | ICD-10-CM | POA: Diagnosis present

## 2015-05-09 DIAGNOSIS — Z9221 Personal history of antineoplastic chemotherapy: Secondary | ICD-10-CM | POA: Diagnosis not present

## 2015-05-09 DIAGNOSIS — Z87891 Personal history of nicotine dependence: Secondary | ICD-10-CM | POA: Diagnosis not present

## 2015-05-09 DIAGNOSIS — Z7902 Long term (current) use of antithrombotics/antiplatelets: Secondary | ICD-10-CM | POA: Diagnosis not present

## 2015-05-09 DIAGNOSIS — E1142 Type 2 diabetes mellitus with diabetic polyneuropathy: Secondary | ICD-10-CM | POA: Diagnosis present

## 2015-05-09 DIAGNOSIS — M5136 Other intervertebral disc degeneration, lumbar region: Secondary | ICD-10-CM | POA: Diagnosis present

## 2015-05-09 DIAGNOSIS — E440 Moderate protein-calorie malnutrition: Secondary | ICD-10-CM | POA: Diagnosis present

## 2015-05-09 DIAGNOSIS — K921 Melena: Secondary | ICD-10-CM

## 2015-05-09 DIAGNOSIS — W19XXXA Unspecified fall, initial encounter: Secondary | ICD-10-CM

## 2015-05-09 DIAGNOSIS — M549 Dorsalgia, unspecified: Secondary | ICD-10-CM | POA: Diagnosis present

## 2015-05-09 DIAGNOSIS — I8510 Secondary esophageal varices without bleeding: Secondary | ICD-10-CM | POA: Diagnosis present

## 2015-05-09 DIAGNOSIS — Z6827 Body mass index (BMI) 27.0-27.9, adult: Secondary | ICD-10-CM

## 2015-05-09 DIAGNOSIS — E11319 Type 2 diabetes mellitus with unspecified diabetic retinopathy without macular edema: Secondary | ICD-10-CM | POA: Diagnosis present

## 2015-05-09 DIAGNOSIS — K3189 Other diseases of stomach and duodenum: Secondary | ICD-10-CM | POA: Diagnosis present

## 2015-05-09 DIAGNOSIS — J449 Chronic obstructive pulmonary disease, unspecified: Secondary | ICD-10-CM | POA: Diagnosis present

## 2015-05-09 DIAGNOSIS — I509 Heart failure, unspecified: Secondary | ICD-10-CM | POA: Diagnosis present

## 2015-05-09 DIAGNOSIS — Z794 Long term (current) use of insulin: Secondary | ICD-10-CM

## 2015-05-09 DIAGNOSIS — R188 Other ascites: Secondary | ICD-10-CM

## 2015-05-09 DIAGNOSIS — D62 Acute posthemorrhagic anemia: Secondary | ICD-10-CM | POA: Diagnosis present

## 2015-05-09 DIAGNOSIS — Z955 Presence of coronary angioplasty implant and graft: Secondary | ICD-10-CM

## 2015-05-09 DIAGNOSIS — Z8249 Family history of ischemic heart disease and other diseases of the circulatory system: Secondary | ICD-10-CM | POA: Diagnosis not present

## 2015-05-09 DIAGNOSIS — E11649 Type 2 diabetes mellitus with hypoglycemia without coma: Secondary | ICD-10-CM | POA: Diagnosis present

## 2015-05-09 DIAGNOSIS — D61818 Other pancytopenia: Secondary | ICD-10-CM | POA: Diagnosis present

## 2015-05-09 DIAGNOSIS — H269 Unspecified cataract: Secondary | ICD-10-CM | POA: Diagnosis present

## 2015-05-09 DIAGNOSIS — Z9181 History of falling: Secondary | ICD-10-CM

## 2015-05-09 DIAGNOSIS — E860 Dehydration: Secondary | ICD-10-CM | POA: Diagnosis present

## 2015-05-09 DIAGNOSIS — K2971 Gastritis, unspecified, with bleeding: Principal | ICD-10-CM | POA: Diagnosis present

## 2015-05-09 DIAGNOSIS — I2510 Atherosclerotic heart disease of native coronary artery without angina pectoris: Secondary | ICD-10-CM | POA: Diagnosis present

## 2015-05-09 DIAGNOSIS — N400 Enlarged prostate without lower urinary tract symptoms: Secondary | ICD-10-CM | POA: Diagnosis present

## 2015-05-09 LAB — URINALYSIS COMPLETE WITH MICROSCOPIC (ARMC ONLY)
Bilirubin Urine: NEGATIVE
Glucose, UA: NEGATIVE mg/dL
Hgb urine dipstick: NEGATIVE
Ketones, ur: NEGATIVE mg/dL
Leukocytes, UA: NEGATIVE
Nitrite: NEGATIVE
Protein, ur: NEGATIVE mg/dL
Specific Gravity, Urine: 1.009 (ref 1.005–1.030)
pH: 6 (ref 5.0–8.0)

## 2015-05-09 LAB — BASIC METABOLIC PANEL
ANION GAP: 5 (ref 5–15)
BUN: 44 mg/dL — ABNORMAL HIGH (ref 6–20)
CALCIUM: 7.6 mg/dL — AB (ref 8.9–10.3)
CHLORIDE: 111 mmol/L (ref 101–111)
CO2: 18 mmol/L — ABNORMAL LOW (ref 22–32)
CREATININE: 1.35 mg/dL — AB (ref 0.61–1.24)
GFR calc non Af Amer: 52 mL/min — ABNORMAL LOW (ref >60)
Glucose, Bld: 140 mg/dL — ABNORMAL HIGH (ref 65–99)
Potassium: 3.3 mmol/L — ABNORMAL LOW (ref 3.5–5.1)
SODIUM: 134 mmol/L — AB (ref 135–145)

## 2015-05-09 LAB — HEPATIC FUNCTION PANEL
ALBUMIN: 2.6 g/dL — AB (ref 3.5–5.0)
ALK PHOS: 124 U/L (ref 38–126)
ALT: 23 U/L (ref 17–63)
AST: 37 U/L (ref 15–41)
BILIRUBIN INDIRECT: 0.4 mg/dL (ref 0.3–0.9)
BILIRUBIN TOTAL: 0.6 mg/dL (ref 0.3–1.2)
Bilirubin, Direct: 0.2 mg/dL (ref 0.1–0.5)
Total Protein: 6.2 g/dL — ABNORMAL LOW (ref 6.5–8.1)

## 2015-05-09 LAB — AMMONIA: Ammonia: 71 umol/L — ABNORMAL HIGH (ref 9–35)

## 2015-05-09 LAB — CBC
HCT: 28.9 % — ABNORMAL LOW (ref 40.0–52.0)
Hemoglobin: 9.4 g/dL — ABNORMAL LOW (ref 13.0–18.0)
MCH: 29.8 pg (ref 26.0–34.0)
MCHC: 32.5 g/dL (ref 32.0–36.0)
MCV: 91.5 fL (ref 80.0–100.0)
PLATELETS: 75 10*3/uL — AB (ref 150–440)
RBC: 3.16 MIL/uL — AB (ref 4.40–5.90)
RDW: 20.2 % — ABNORMAL HIGH (ref 11.5–14.5)
WBC: 2.2 10*3/uL — AB (ref 3.8–10.6)

## 2015-05-09 LAB — GLUCOSE, CAPILLARY
GLUCOSE-CAPILLARY: 136 mg/dL — AB (ref 65–99)
Glucose-Capillary: 176 mg/dL — ABNORMAL HIGH (ref 65–99)

## 2015-05-09 LAB — PROTIME-INR
INR: 1.4
PROTHROMBIN TIME: 17.3 s — AB (ref 11.4–15.0)

## 2015-05-09 LAB — TROPONIN I

## 2015-05-09 MED ORDER — ALLOPURINOL 100 MG PO TABS
300.0000 mg | ORAL_TABLET | Freq: Every day | ORAL | Status: DC
  Administered 2015-05-09 – 2015-05-12 (×4): 300 mg via ORAL
  Filled 2015-05-09 (×4): qty 3

## 2015-05-09 MED ORDER — ONDANSETRON HCL 4 MG/2ML IJ SOLN
4.0000 mg | Freq: Four times a day (QID) | INTRAMUSCULAR | Status: DC | PRN
  Administered 2015-05-09: 4 mg via INTRAVENOUS

## 2015-05-09 MED ORDER — PANTOPRAZOLE SODIUM 40 MG IV SOLR
40.0000 mg | Freq: Two times a day (BID) | INTRAVENOUS | Status: DC
  Administered 2015-05-10 – 2015-05-12 (×5): 40 mg via INTRAVENOUS
  Filled 2015-05-09 (×6): qty 40

## 2015-05-09 MED ORDER — INSULIN PUMP
Freq: Three times a day (TID) | SUBCUTANEOUS | Status: DC
  Administered 2015-05-09: 22:00:00 via SUBCUTANEOUS
  Administered 2015-05-10: 12:00:00 0.4 via SUBCUTANEOUS
  Administered 2015-05-10 – 2015-05-11 (×2): via SUBCUTANEOUS
  Filled 2015-05-09: qty 1

## 2015-05-09 MED ORDER — NITROGLYCERIN 0.4 MG SL SUBL: 0.4000 mg | SUBLINGUAL_TABLET | SUBLINGUAL | Status: DC | PRN

## 2015-05-09 MED ORDER — TAMSULOSIN HCL 0.4 MG PO CAPS
0.4000 mg | ORAL_CAPSULE | Freq: Every day | ORAL | Status: DC
  Administered 2015-05-10 – 2015-05-12 (×3): 0.4 mg via ORAL
  Filled 2015-05-09 (×3): qty 1

## 2015-05-09 MED ORDER — ONDANSETRON HCL 4 MG PO TABS: 4.0000 mg | ORAL_TABLET | Freq: Four times a day (QID) | ORAL | Status: DC | PRN

## 2015-05-09 MED ORDER — SUCRALFATE 1 G PO TABS
1.0000 g | ORAL_TABLET | Freq: Two times a day (BID) | ORAL | Status: DC
  Administered 2015-05-10 (×2): 1 g via ORAL
  Filled 2015-05-09 (×2): qty 1

## 2015-05-09 MED ORDER — POLYSACCHARIDE IRON COMPLEX 150 MG PO CAPS
150.0000 mg | ORAL_CAPSULE | Freq: Every day | ORAL | Status: DC
Start: 2015-05-10 — End: 2015-05-12
  Administered 2015-05-10 – 2015-05-12 (×3): 150 mg via ORAL
  Filled 2015-05-09 (×5): qty 1

## 2015-05-09 MED ORDER — ACETAMINOPHEN 650 MG RE SUPP: 650.0000 mg | Freq: Four times a day (QID) | RECTAL | Status: DC | PRN

## 2015-05-09 MED ORDER — POLYETHYLENE GLYCOL 3350 17 G PO PACK: 17.0000 g | PACK | Freq: Every day | ORAL | Status: DC | PRN

## 2015-05-09 MED ORDER — SODIUM CHLORIDE 0.9 % IV SOLN
80.0000 mg | Freq: Once | INTRAVENOUS | Status: AC
  Administered 2015-05-09: 80 mg via INTRAVENOUS
  Filled 2015-05-09: qty 80

## 2015-05-09 MED ORDER — ONDANSETRON HCL 4 MG/2ML IJ SOLN
INTRAMUSCULAR | Status: AC
  Administered 2015-05-09: 4 mg via INTRAVENOUS
  Filled 2015-05-09: qty 2

## 2015-05-09 MED ORDER — MORPHINE SULFATE (PF) 2 MG/ML IV SOLN
1.0000 mg | INTRAVENOUS | Status: DC | PRN
  Administered 2015-05-09: 1 mg via INTRAVENOUS
  Filled 2015-05-09 (×2): qty 1

## 2015-05-09 MED ORDER — ADULT MULTIVITAMIN W/MINERALS CH
1.0000 | ORAL_TABLET | Freq: Every day | ORAL | Status: DC
  Administered 2015-05-09 – 2015-05-12 (×4): 1 via ORAL
  Filled 2015-05-09 (×4): qty 1

## 2015-05-09 MED ORDER — ACETAMINOPHEN 325 MG PO TABS: 650.0000 mg | ORAL_TABLET | Freq: Four times a day (QID) | ORAL | Status: DC | PRN

## 2015-05-09 MED ORDER — MIDODRINE HCL 5 MG PO TABS
5.0000 mg | ORAL_TABLET | Freq: Three times a day (TID) | ORAL | Status: DC
  Administered 2015-05-10 – 2015-05-12 (×6): 5 mg via ORAL
  Filled 2015-05-09 (×10): qty 1

## 2015-05-09 MED ORDER — NITROGLYCERIN 0.4 MG/HR TD PT24: 0.4000 mg | MEDICATED_PATCH | Freq: Every day | TRANSDERMAL | Status: DC | PRN

## 2015-05-09 MED ORDER — SPIRONOLACTONE 25 MG PO TABS
100.0000 mg | ORAL_TABLET | Freq: Every day | ORAL | Status: DC
  Administered 2015-05-09: 23:00:00 100 mg via ORAL
  Filled 2015-05-09: qty 4

## 2015-05-09 NOTE — ED Notes (Addendum)
Patients skin tears on L and R forearm cleaned with normal saline and dressed

## 2015-05-09 NOTE — ED Notes (Addendum)
Patient arrived by EMS from home c/o witnessed fall from last night. Patient states he is not hurting anywhere from fall but has chronic back pain and is currently experiencing back pain. Patient has skin tears on R forearm from fall. Patient reports being on blood thinners. Patient denies hitting head or LOC. Patient has HX of lung cancer and is currently in remission. Patient is diabetic with a pump. B/S with EMS 133

## 2015-05-09 NOTE — Telephone Encounter (Signed)
Called to report that he has become very weak and is unable to walk, he fell last night and tore his skin.  He is very weak. Asking for him to be checked, feels he needs to be in the hospital. I discussed this with Dr Rogue Bussing, he advised patient to go to ER. I informed Angela Nevin of Dr B advice and she begrudgingly agreed to take him.

## 2015-05-09 NOTE — ED Provider Notes (Signed)
[facility] Emergency Department Provider Note  ____________________________________________  Time seen: Approximately 1:54 PM  I have reviewed the triage vital signs and the nursing notes.   HISTORY  Chief Complaint Fall    HPI [name]. is a 70 y.o. male with of non-small cell lung cancer not currently on any chemotherapy or radiation, history of liver cirrhosis, portal hypertension, history of coronary artery disease, insulin-dependent diabetes mellitus, obstructive sleep apnea, hypertension presents for evaluation of several days of generalized weakness as well as a fall yesterday evening, gradual onset, constant since onset, moderate, no modifying factors. Patient reports that last night his legs just gave out from under him due to generalized weakness. He bumped his right arm and has skin tears from the fall but otherwise denies any injury. He has chronic back pain which is not increased since a fall last night. Chest pain or difficulty breathing. No vomiting, diarrhea, fevers or chills. He has had dark stools. History of GI bleed.   Past Medical History  Diagnosis Date  . Cataracts, both eyes   . CHF (congestive heart failure) ([place])   . COPD (chronic obstructive pulmonary disease) ([place])   . Detached retina   . Diabetes ([place])   . HTN (hypertension)   . Pleural effusion   . Hyperlipidemia   . IDA (iron deficiency anemia)   . Thrombocytopenia ([place])   . Small cell lung cancer ([place])   . Splenomegaly   . Azotemia   . GI bleed   . Cirrhosis ([place])   . Nephrotic range proteinuria   . Renal insufficiency   . Small cell carcinoma of lung ([place]) 07/27/2014  . History of chemotherapy     finished July 2015-4 cycles of carbo/taxol  . Ascites     Patient Active Problem List   Diagnosis Date Noted  . Small cell carcinoma of lung ([place]) 07/27/2014  . Cirrhosis of liver ([place]) 06/20/2014  . COPD (chronic obstructive pulmonary disease) ([place])  06/20/2014  . CAD (coronary artery disease) 06/20/2014  . Renal azotemia 06/20/2014  . DM (diabetes mellitus) ([place]) 06/20/2014    Past Surgical History  Procedure Laterality Date  . Fistula repair    . Laminectomy    . Cardiac stents    . Colonoscopy  12/18/12    Current Outpatient Rx  Name  Route  Sig  Dispense  Refill  . allopurinol (ZYLOPRIM) 100 MG tablet   Oral   Take 100 mg by mouth daily.         . clopidogrel (PLAVIX) 75 MG tablet   Oral   Take 75 mg by mouth daily.         . Ferrous Fum-Iron Polysacch-FA 162-115.2-1 MG CAPS   Oral   Take 1 capsule by mouth 1 day or 1 dose.         . furosemide (LASIX) 40 MG tablet   Oral   Take 40 mg by mouth 2 (two) times daily.          Marland Kitchen GENERLAC 10 GM/15ML SOLN      TK 30 ML PO BID      5     Dispense as written.   . insulin lispro (HUMALOG) 100 UNIT/ML injection   Subcutaneous   Inject 100 Units into the skin once. Once daily through Insulin pump; based on sliding scale sugar         . lactulose (CHRONULAC) 10 GM/15ML solution   Oral   Take 30 g  by mouth 2 (two) times daily as needed for mild constipation.         . metoprolol tartrate (LOPRESSOR) 25 MG tablet   Oral   Take 12.5 mg by mouth 1 day or 1 dose.         . Multiple Vitamins-Minerals (CENTRUM ADULTS) TABS   Oral   Take 1 tablet by mouth 1 day or 1 dose.         . nitroGLYCERIN (NITRODUR - DOSED IN MG/24 HR) 0.4 mg/hr patch   Transdermal   Place 0.4 mg onto the skin daily.         . nitroGLYCERIN (NITROSTAT) 0.3 MG SL tablet   Sublingual   Place 0.3 mg under the tongue every 5 (five) minutes as needed for chest pain.         Marland Kitchen omeprazole (PRILOSEC) 20 MG capsule   Oral   Take 20 mg by mouth daily.      3   . pantoprazole (PROTONIX) 40 MG tablet   Oral   Take 40 mg by mouth daily.         . polyethylene glycol (MIRALAX / GLYCOLAX) packet   Oral   Take 17 g by mouth daily as needed for mild constipation.          . promethazine (PHENERGAN) 25 MG tablet   Oral   Take 25 mg by mouth every 8 (eight) hours as needed for nausea or vomiting.         Marland Kitchen spironolactone (ALDACTONE) 100 MG tablet   Oral   Take 100 mg by mouth as needed. For swelling      0   . sucralfate (CARAFATE) 1 G tablet   Oral   Take 1 g by mouth 2 (two) times daily before a meal.         . tamsulosin (FLOMAX) 0.4 MG CAPS capsule   Oral   Take 0.4 mg by mouth 1 day or 1 dose.         Marland Kitchen TANDEM 162-115.2 MG CAPS      TK ONE C PO QD      3     Dispense as written.     Allergies No known allergies  History reviewed. No pertinent family history.  Social History Social History  Substance Use Topics  . Smoking status: Former Research scientist (life sciences)  . Smokeless tobacco: None  . Alcohol Use: No    Review of Systems Constitutional: No fever/chills Eyes: No visual changes. ENT: No sore throat. Cardiovascular: Denies chest pain. Respiratory: Denies shortness of breath. Gastrointestinal: No abdominal pain.  No nausea, no vomiting.  No diarrhea.  No constipation. Genitourinary: Negative for dysuria. Musculoskeletal: Negative for back pain. Skin: Negative for rash. Neurological: Negative for headaches, focal weakness or numbness.  10-point ROS otherwise negative.  ____________________________________________   PHYSICAL EXAM:  VITAL SIGNS: ED Triage Vitals  Enc Vitals Group     BP 05/09/15 1250 123/60 mmHg     Pulse Rate 05/09/15 1250 63     Resp 05/09/15 1250 20     Temp 05/09/15 1250 97.7 F (36.5 C)     Temp Source 05/09/15 1250 Oral     SpO2 05/09/15 1250 100 %     Weight 05/09/15 1250 219 lb 4.8 oz (99.474 kg)     Height --      Head Cir --      Peak Flow --      Pain Score 05/09/15 1254 6  Pain Loc --      Pain Edu? --      Excl. in [place]? --     Constitutional: Alert and oriented. Pale but nontoxic-appearing and in no acute distress. Eyes: Conjunctivae are normal. PERRL. EOMI. Head:  Atraumatic. Nose: No congestion/rhinnorhea. Mouth/Throat: Mucous membranes are moist.  Oropharynx non-erythematous. Neck: No stridor.  No cervical spine tenderness to palpation. Cardiovascular: Normal rate, regular rhythm. Grossly normal heart sounds.  Good peripheral circulation. Respiratory: Normal respiratory effort.  No retractions. Lungs CTAB. Gastrointestinal: Soft and nontender. No distention.  No CVA tenderness. Rectal: Melena in the rectal vault is strongly guaiac positive. Musculoskeletal: No lower extremity tenderness nor edema.  No joint effusions. Mild tenderness to palpation in the midline of the lumbar spine as well as in the paraspinal muscles to the right and left the lumbar spine. Neurologic:  Normal speech and language. No gross focal neurologic deficits are appreciated. 5 out of 5 strength bilateral upper and lower extremities, sensation intact to light touch throughout. No saddle anesthesia. Normal strength of dorsiflexion of the big toes bilaterally. Skin:  Skin is warm, dry. Hemostatic skin tears in the right arm, no bony step-off or deformity. No rash noted. Psychiatric: Mood and affect are normal. Speech and behavior are normal.  ____________________________________________   LABS (all labs ordered are listed, but only abnormal results are displayed)  Labs Reviewed  BASIC METABOLIC PANEL - Abnormal; Notable for the following:    Sodium 134 (*)    Potassium 3.3 (*)    CO2 18 (*)    Glucose, Bld 140 (*)    BUN 44 (*)    Creatinine, Ser 1.35 (*)    Calcium 7.6 (*)    GFR calc non Af Amer 52 (*)    All other components within normal limits  CBC - Abnormal; Notable for the following:    WBC 2.2 (*)    RBC 3.16 (*)    Hemoglobin 9.4 (*)    HCT 28.9 (*)    RDW 20.2 (*)    Platelets 75 (*)    All other components within normal limits  PROTIME-INR - Abnormal; Notable for the following:    Prothrombin Time 17.3 (*)    All other components within normal limits   GLUCOSE, CAPILLARY - Abnormal; Notable for the following:    Glucose-Capillary 136 (*)    All other components within normal limits  HEPATIC FUNCTION PANEL - Abnormal; Notable for the following:    Total Protein 6.2 (*)    Albumin 2.6 (*)    All other components within normal limits  AMMONIA - Abnormal; Notable for the following:    Ammonia 71 (*)    All other components within normal limits  URINALYSIS COMPLETEWITH MICROSCOPIC ([facility] ONLY) - Abnormal; Notable for the following:    Color, Urine YELLOW (*)    APPearance CLEAR (*)    Bacteria, UA RARE (*)    Squamous Epithelial / LPF 0-5 (*)    All other components within normal limits  TROPONIN I  CBG MONITORING, ED   ____________________________________________  EKG  ED ECG REPORT I, Joanne Gavel, the attending physician, personally viewed and interpreted this ECG.   Date: 05/09/2015  EKG Time: 12:47  Rate: 65  Rhythm: normal sinus rhythm  Axis: normal  Intervals:right bundle branch block  ST&T Change: No acute ST elevation. Q waves in V1, aVL.  ____________________________________________  RADIOLOGY  CT head IMPRESSION: Atrophy and small vessel disease. No acute intracranial findings.  CXR IMPRESSION: Minimal right pleural effusion is noted. Stable left perihilar radiation fibrosis. Stable rounded density seen in right lung base most consistent with rounded atelectasis.  [place] lumbar spine IMPRESSION: Degenerative disc and facet disease changes lumbar spine similar to that identified on preceding CT.  No acute abnormalities.  ____________________________________________   PROCEDURES  Procedure(s) performed: None  Critical Care performed: No  ____________________________________________   INITIAL IMPRESSION / ASSESSMENT AND PLAN / ED COURSE  Pertinent labs & imaging results that were available during my care of the patient were reviewed by me and considered in my medical decision making (see  chart for details).  Haywood Filler. is a 70 y.o. male with of non-small cell lung cancer not currently on any chemotherapy or radiation, history of liver cirrhosis, portal hypertension, history of coronary artery disease, insulin-dependent diabetes mellitus, obstructive sleep apnea, hypertension presents for evaluation of several days of generalized weakness as well as a fall yesterday evening. On exam, he does have significant skin pallor but is nontoxic in no acute distress. His vital signs are stable, he is afebrile. Labs reviewed and are notable for anemia, hemoglobin 9.4 today, decreased from 11 just two months ago. INR 1.4, ammonia elevated at 71. He has guaiac positive melena and my concern is for upper GI bleed. Imaging is negative for any acute pathology. Protonix ordered. I discussed the case with the hospitalist, Dr. Deloria Lair, for admission at this time. ____________________________________________   FINAL CLINICAL IMPRESSION(S) / ED DIAGNOSES  Final diagnoses:  Weakness  Falls, initial encounter  Gastrointestinal hemorrhage with melena      Joanne Gavel, MD 05/09/15 1556

## 2015-05-09 NOTE — H&P (Signed)
[facility] NAME: [name]    MR#:  [id number]  DATE OF BIRTH:  1945/04/23  DATE OF ADMISSION:  05/09/2015  PRIMARY CARE PHYSICIAN: BABAOFF, Caryl Bis, MD   REQUESTING/REFERRING PHYSICIAN: Dr. Darrick Penna  CHIEF COMPLAINT:   Chief Complaint  Patient presents with  . Fall    HISTORY OF PRESENT ILLNESS:  [name]  is a 70 y.o. male with a known history of Nonalcoholic steatohepatitis with cirrhosis, CHF, COPD, diabetes, hypertension, chronic anemia, small cell lung cancer, previous history of GI bleed who presented to the hospital after a fall and feeling profoundly weak. Patient says for the past few days he has become increasingly weak and lethargic. He had a fall earlier today at home but did not lose consciousness or have any head trauma. Given his profound weakness and fall he was brought to the ER for further evaluation. Patient was noted to have a hemoglobin of 9.4 and his previous hemoglobin 2 months ago was 11. Patient's rectal exam was strongly heme positive with melanotic stools. And hospitalist services were contacted further treatment and evaluation. Patient does admit to some shortness of breath on minimal exertion, but no chest pain no abdominal pain no nausea no vomiting. He does have dark stools but he is also on iron supplements.  PAST MEDICAL HISTORY:   Past Medical History  Diagnosis Date  . Cataracts, both eyes   . CHF (congestive heart failure) ([place])   . COPD (chronic obstructive pulmonary disease) ([place])   . Detached retina   . Diabetes ([place])   . HTN (hypertension)   . Pleural effusion   . Hyperlipidemia   . IDA (iron deficiency anemia)   . Thrombocytopenia ([place])   . Small cell lung cancer ([place])   . Splenomegaly   . Azotemia   . GI bleed   . Cirrhosis ([place])   . Nephrotic range proteinuria   . Renal insufficiency   . Small cell carcinoma of lung ([place]) 07/27/2014  . History of chemotherapy      finished July 2015-4 cycles of carbo/taxol  . Ascites     PAST SURGICAL HISTORY:   Past Surgical History  Procedure Laterality Date  . Fistula repair    . Laminectomy    . Cardiac stents    . Colonoscopy  12/18/12    SOCIAL HISTORY:   Social History  Substance Use Topics  . Smoking status: Former Research scientist (life sciences)  . Smokeless tobacco: Not on file  . Alcohol Use: No    FAMILY HISTORY:   Family History  Problem Relation Age of Onset  . Heart attack Mother   . Heart attack Father     DRUG ALLERGIES:  No Known Allergies  REVIEW OF SYSTEMS:   Review of Systems  Constitutional: Negative for fever and weight loss.  HENT: Negative for congestion, nosebleeds and tinnitus.   Eyes: Negative for blurred vision, double vision and redness.  Respiratory: Negative for cough, hemoptysis and shortness of breath.   Cardiovascular: Negative for chest pain, orthopnea, leg swelling and PND.  Gastrointestinal: Negative for nausea, vomiting, abdominal pain, diarrhea and melena.  Genitourinary: Negative for dysuria, urgency and hematuria.  Musculoskeletal: Negative for joint pain and falls.  Neurological: Positive for weakness (Generalized). Negative for dizziness, tingling, sensory change, focal weakness, seizures and headaches.  Endo/Heme/Allergies: Negative for polydipsia. Does not bruise/bleed easily.  Psychiatric/Behavioral: Negative for depression and memory loss. The patient is not nervous/anxious.  MEDICATIONS AT HOME:   Prior to Admission medications   Medication Sig Start Date End Date Taking? Authorizing Provider  allopurinol (ZYLOPRIM) 300 MG tablet Take 300 mg by mouth daily.   Yes Historical Provider, MD  clopidogrel (PLAVIX) 75 MG tablet Take 75 mg by mouth daily.   Yes Historical Provider, MD  ferrous fumarate-iron polysaccharide complex (TANDEM) 162-115.2 MG CAPS capsule Take 1 capsule by mouth daily with breakfast.   Yes Historical Provider, MD  furosemide (LASIX) 40  MG tablet Take 40 mg by mouth daily.    Yes Historical Provider, MD  Insulin Human (INSULIN PUMP) SOLN Pt uses Humalog insulin.   Yes Historical Provider, MD  lactulose (CHRONULAC) 10 GM/15ML solution Take 30 g by mouth 2 (two) times daily as needed for mild constipation.   Yes Historical Provider, MD  midodrine (PROAMATINE) 5 MG tablet Take 5 mg by mouth 3 (three) times daily with meals.   Yes Historical Provider, MD  Multiple Vitamin (MULTIVITAMIN WITH MINERALS) TABS tablet Take 1 tablet by mouth daily.   Yes Historical Provider, MD  nitroGLYCERIN (NITRODUR - DOSED IN MG/24 HR) 0.4 mg/hr patch Place 0.4 mg onto the skin daily as needed (for chest pain).    Yes Historical Provider, MD  nitroGLYCERIN (NITROSTAT) 0.4 MG SL tablet Place 0.4 mg under the tongue every 5 (five) minutes as needed for chest pain.   Yes Historical Provider, MD  omeprazole (PRILOSEC) 20 MG capsule Take 20 mg by mouth daily.   Yes Historical Provider, MD  polyethylene glycol (MIRALAX / GLYCOLAX) packet Take 17 g by mouth daily as needed for mild constipation.   Yes Historical Provider, MD  spironolactone (ALDACTONE) 100 MG tablet Take 100 mg by mouth daily.    Yes Historical Provider, MD  sucralfate (CARAFATE) 1 G tablet Take 1 g by mouth 2 (two) times daily before a meal.   Yes Historical Provider, MD  tamsulosin (FLOMAX) 0.4 MG CAPS capsule Take 0.4 mg by mouth daily after breakfast.    Yes Historical Provider, MD      VITAL SIGNS:  Blood pressure 125/59, pulse 58, temperature 97.7 F (36.5 C), temperature source Oral, resp. rate 19, weight 99.474 kg (219 lb 4.8 oz), SpO2 100 %.  PHYSICAL EXAMINATION:  Physical Exam  GENERAL:  70 y.o.-year-old patient lying in the bed with no acute distress.  EYES: Pupils equal, round, reactive to light and accommodation. No scleral icterus. Extraocular muscles intact. Pale conjunctiva HEENT: Head atraumatic, normocephalic. Oropharynx and nasopharynx clear. No oropharyngeal erythema,  moist oral mucosa  NECK:  Supple, no jugular venous distention. No thyroid enlargement, no tenderness.  LUNGS: Normal breath sounds bilaterally, no wheezing, rales, rhonchi. No use of accessory muscles of respiration.  CARDIOVASCULAR: S1, S2 RRR. Soft 2/6 diastolic murmur heard at the base, no rubs, gallops, clicks.  ABDOMEN: Soft, nontender, nondistended. Bowel sounds present. No organomegaly or mass.  EXTREMITIES: Trace pedal edema bilaterally, no cyanosis, or clubbing. + 2 pedal & radial pulses b/l.   NEUROLOGIC: Cranial nerves II through XII are intact. No focal Motor or sensory deficits appreciated b/l PSYCHIATRIC: The patient is alert and oriented x 3. Good affect.  SKIN: No obvious rash, lesion, or ulcer. Positive skin tear on the right forearm from the recent fall.  LABORATORY PANEL:   CBC  Recent Labs Lab 05/09/15 1257  WBC 2.2*  HGB 9.4*  HCT 28.9*  PLT 75*   ------------------------------------------------------------------------------------------------------------------  Chemistries   Recent Labs Lab 05/09/15 1257  NA 134*  K 3.3*  CL 111  CO2 18*  GLUCOSE 140*  BUN 44*  CREATININE 1.35*  CALCIUM 7.6*  AST 37  ALT 23  ALKPHOS 124  BILITOT 0.6   ------------------------------------------------------------------------------------------------------------------  Cardiac Enzymes  Recent Labs Lab 05/09/15 1257  TROPONINI <0.03   ------------------------------------------------------------------------------------------------------------------  RADIOLOGY:  Dg Chest 2 View  05/09/2015  CLINICAL DATA:  Witnessed fall.  Current history of lung cancer. EXAM: CHEST  2 VIEW COMPARISON:  CT scan of March 08, 2015. Radiograph of March 24, 2014. FINDINGS: Stable cardiomediastinal silhouette. Right internal jugular Port-A-Cath is unchanged in position. Minimal right pleural effusion is noted. No pneumothorax is noted. Stable left perihilar density is noted  consistent with radiation fibrosis as described on prior CT scan. Stable rounded right basilar opacity is noted consistent with rounded atelectasis as described on prior CT exam. Bony thorax is unremarkable. IMPRESSION: Minimal right pleural effusion is noted. Stable left perihilar radiation fibrosis. Stable rounded density seen in right lung base most consistent with rounded atelectasis. Electronically Signed   By: Marijo Conception, M.D.   On: 05/09/2015 15:00   Dg Lumbar Spine 2-3 Views  05/09/2015  CLINICAL DATA:  Witnessed fall last night, chronic back pain including today, history lung cancer, diabetes mellitus, hypertension, CHF, COPD EXAM: LUMBAR SPINE - 2-3 VIEW COMPARISON:  CT pelvis 07/26/2014 FINDINGS: 5 non-rib-bearing lumbar vertebra. Diffuse osseous demineralization. Multilevel disc space narrowing and endplate spur formation. Facet degenerative changes at mid to lower lumbar spine. Vertebral body heights maintained without fracture or subluxation. Bone destruction or spondylolysis. SI joints preserved. Scattered atherosclerotic calcifications. IMPRESSION: Degenerative disc and facet disease changes lumbar spine similar to that identified on preceding CT. No acute abnormalities. Electronically Signed   By: Lavonia Dana M.D.   On: 05/09/2015 15:06   Ct Head Wo Contrast  05/09/2015  CLINICAL DATA:  Weakness.  Small cell lung cancer. EXAM: CT HEAD WITHOUT CONTRAST TECHNIQUE: Contiguous axial images were obtained from the base of the skull through the vertex without intravenous contrast. COMPARISON:  08/18/2013. FINDINGS: No evidence for acute infarction, hemorrhage, mass lesion, or extra-axial fluid. Generalized atrophy. Hydrocephalus ex vacuo. Hypoattenuation of white matter, consistent with small vessel disease. Vascular calcification. Calvarium intact. No sinus or mastoid disease. Negative orbits. Compared with priors, progression of atrophy since 2015. IMPRESSION: Atrophy and small vessel disease.   No acute intracranial findings. Electronically Signed   By: Staci Righter M.D.   On: 05/09/2015 14:51     IMPRESSION AND PLAN:   70 year old male with past medical history of nonalcoholic steatohepatitis, liver cirrhosis, history of previous GI bleed, history of previous MI status post stents, hypertension, hyperlipidemia, chronic anemia/thrombocytopenia, diabetes who presents to the hospital after feeling increasingly weak and having a fall.  1. GI bleed-patient presents with melanotic stools and worsening anemia. -This is an upper GI bleed given the patient's symptoms. I will hold his Plavix -Get a gastroenterology consult, follow serial hemoglobin. Place him on clear liquid diet on PPI twice a day.  2. Acute blood loss anemia-patient's baseline hemoglobin is around 11 now down to 9 due to the upper GI bleed. -Follow serial hemoglobin. Transfuse if hemoglobin is less than 7. Hold Plavix. -Continue iron supplements.  3. Generalized weakness status post fall-we'll get a physical therapy consult to assess patient's mobility.  4. Diabetes type 2 without complication-continue patient's insulin pump.  5. History of gout-no acute attack, continue allopurinol  6. BPH-continue Flomax.   All the records are reviewed and case discussed  with ED provider. Management plans discussed with the patient, family and they are in agreement.  CODE STATUS: Full  TOTAL TIME TAKING CARE OF THIS PATIENT: 45 minutes.    Henreitta Leber M.D on 05/09/2015 at 4:21 PM  Between 7am to 6pm - Pager - [phone]  After 6pm go to [email or web address] - password [email or web address] [facility]  Office  [phone]  CC: Primary care physician; BABAOFF, Caryl Bis, MD

## 2015-05-09 NOTE — ED Notes (Signed)
Patient transported to CT and X ray 

## 2015-05-10 DIAGNOSIS — K7460 Unspecified cirrhosis of liver: Secondary | ICD-10-CM

## 2015-05-10 DIAGNOSIS — K922 Gastrointestinal hemorrhage, unspecified: Secondary | ICD-10-CM

## 2015-05-10 DIAGNOSIS — R188 Other ascites: Secondary | ICD-10-CM | POA: Insufficient documentation

## 2015-05-10 LAB — COMPREHENSIVE METABOLIC PANEL
ALBUMIN: 3.3 g/dL — AB (ref 3.5–5.0)
ALK PHOS: 148 U/L — AB (ref 38–126)
ALT: 25 U/L (ref 17–63)
ANION GAP: 8 (ref 5–15)
AST: 37 U/L (ref 15–41)
BUN: 51 mg/dL — ABNORMAL HIGH (ref 6–20)
CALCIUM: 9 mg/dL (ref 8.9–10.3)
CO2: 21 mmol/L — AB (ref 22–32)
Chloride: 105 mmol/L (ref 101–111)
Creatinine, Ser: 1.76 mg/dL — ABNORMAL HIGH (ref 0.61–1.24)
GFR calc Af Amer: 44 mL/min — ABNORMAL LOW (ref >60)
GFR calc non Af Amer: 38 mL/min — ABNORMAL LOW (ref >60)
GLUCOSE: 63 mg/dL — AB (ref 65–99)
Potassium: 4 mmol/L (ref 3.5–5.1)
SODIUM: 134 mmol/L — AB (ref 135–145)
Total Bilirubin: 0.9 mg/dL (ref 0.3–1.2)
Total Protein: 7.4 g/dL (ref 6.5–8.1)

## 2015-05-10 LAB — CBC
HCT: 29.1 % — ABNORMAL LOW (ref 40.0–52.0)
HEMOGLOBIN: 9.8 g/dL — AB (ref 13.0–18.0)
MCH: 30.7 pg (ref 26.0–34.0)
MCHC: 33.6 g/dL (ref 32.0–36.0)
MCV: 91.4 fL (ref 80.0–100.0)
Platelets: 78 10*3/uL — ABNORMAL LOW (ref 150–440)
RBC: 3.19 MIL/uL — AB (ref 4.40–5.90)
RDW: 20.6 % — ABNORMAL HIGH (ref 11.5–14.5)
WBC: 2 10*3/uL — AB (ref 3.8–10.6)

## 2015-05-10 LAB — GLUCOSE, CAPILLARY
GLUCOSE-CAPILLARY: 112 mg/dL — AB (ref 65–99)
Glucose-Capillary: 66 mg/dL (ref 65–99)
Glucose-Capillary: 84 mg/dL (ref 65–99)
Glucose-Capillary: 103 mg/dL — ABNORMAL HIGH (ref 65–99)

## 2015-05-10 LAB — PROTIME-INR
INR: 1.3
Prothrombin Time: 16.3 seconds — ABNORMAL HIGH (ref 11.4–15.0)

## 2015-05-10 MED ORDER — INSULIN PUMP: Freq: Three times a day (TID) | SUBCUTANEOUS | Status: DC

## 2015-05-10 MED ORDER — SODIUM CHLORIDE 0.9 % IV SOLN
INTRAVENOUS | Status: AC
  Administered 2015-05-10 – 2015-05-11 (×2): via INTRAVENOUS

## 2015-05-10 NOTE — Evaluation (Signed)
Physical Therapy Evaluation Patient Details Name: [name]. MRN: [id number] DOB: 10-Jul-1945 Today's Date: 05/10/2015   History of Present Illness  Cameron  "Bugs" [name] is a 70yo white male who came to [facility] on 4/12 after a fall at home and generalized weakness. Upon arrival pt noted to have findings suggestive of a GIB, with melanic stools. PMH: NASH, cirrhosis, CHF, COPD, DM, HTN, chronic anemia, and LungCA (in remission for 18 months). Pt reports 1 prior fall 58month ago due to an episode of suspected orthostais.   Clinical Impression  At evaluation, pt is received semirecumbent in bed upon entry, no family/caregiver present. The pt is awake and agreeable to participate. No acute distress noted at this time. The pt is alert and oriented x3, pleasant, conversational, and following simple and multi-step commands consistently. Pt is a good historian. Orthostatic vitals assessed, with noted drop in BP both with standing and sitting; additional mobility deferred. Pt reports one falls in the last 6 months, wherein his legs gave out on him, and one 18MA after an episode of orthostatic hypotension. Pt grip strength is moderately weak and symmetrical; global strength as screened during functional mobility assessment presents with moderate impairment, the pt now requiring physical assistance for bed mobility and transfers, whereas the patient performs these indep at baseline.    Patient presenting with impairment of strength, cardiopulmonary status, balance, and activity tolerance, limiting ability to perform ADL and mobility tasks at  baseline level of function. Patient will benefit from skilled intervention to address the above impairments and limitations, in order to restore to prior level of function, improve patient safety upon discharge, and to decrease falls risk.        Follow Up Recommendations Home health PT    Equipment Recommendations  None recommended by PT    Recommendations  for Other Services       Precautions / Restrictions Precautions Precaution Comments: no falls score available      Mobility  Bed Mobility Overal bed mobility: Needs Assistance Bed Mobility: Supine to Sit     Supine to sit: Mod assist        Transfers Overall transfer level: Needs assistance Equipment used: 1 person hand held assist Transfers: Sit to/from Stand;Stand Pivot Transfers Sit to Stand: Min assist Stand pivot transfers: Min assist          Ambulation/Gait Ambulation/Gait assistance:  (deferred at eval; pt is orthostatic. )              Stairs            Wheelchair Mobility    Modified Rankin (Stroke Patients Only)       Balance Overall balance assessment: History of Falls                                           Pertinent Vitals/Pain Pain Assessment: No/denies pain    Home Living Family/patient expects to be discharged to:: Private residence Living Arrangements: Spouse/significant other;Children Available Help at Discharge: Family Type of Home: House Home Access: Stairs to enter;Ramped entrance Entrance Stairs-Rails: Right Entrance Stairs-Number of Steps: 2 Home Layout: One level Home Equipment: Walker - 2 wheels;Cane - single point;Wheelchair - manual      Prior Function Level of Independence: Needs assistance   Gait / Transfers Assistance Needed: household distances only c RW or SPC, xfers modI   ADL's /  Homemaking Assistance Needed: Indep with toiletting and dressing, modA with bathing.         Hand Dominance        Extremity/Trunk Assessment   Upper Extremity Assessment: Generalized weakness (insufficient strength to pull self to sitting. )           Lower Extremity Assessment: Generalized weakness;Overall WFL for tasks assessed         Communication   Communication: No difficulties  Cognition Arousal/Alertness: Awake/alert Behavior During Therapy: WFL for tasks  assessed/performed Overall Cognitive Status: Within Functional Limits for tasks assessed                      General Comments      Exercises        Assessment/Plan    PT Assessment Patient needs continued PT services  PT Diagnosis Generalized weakness;Difficulty walking   PT Problem List Decreased strength;Decreased range of motion;Decreased activity tolerance;Decreased balance;Decreased mobility;Decreased coordination  PT Treatment Interventions Gait training;Functional mobility training;Therapeutic activities;Stair training;Therapeutic exercise;Balance training;Patient/family education   PT Goals (Current goals can be found in the Care Plan section) Acute Rehab PT Goals Patient Stated Goal: return to home.  PT Goal Formulation: With patient Time For Goal Achievement: 05/24/15 Potential to Achieve Goals: Good    Frequency Min 2X/week   Barriers to discharge        Co-evaluation               End of Session   Activity Tolerance: Patient tolerated treatment well;Patient limited by fatigue;Treatment limited secondary to medical complications (Comment);No increased pain Patient left: in chair;with call bell/phone within reach;with chair alarm set Nurse Communication: Mobility status;Other (comment)         Time: 1106-1130 PT Time Calculation (min) (ACUTE ONLY): 24 min   Charges:   PT Evaluation $PT Eval Moderate Complexity: 1 Procedure PT Treatments $Therapeutic Activity: 8-22 mins   PT G Codes:       12:03 PM, Jun 05, 2015 Etta Grandchild, PT, DPT PRN Physical Therapist - [place] License # [id number] [phone] (mobile)

## 2015-05-10 NOTE — Consult Note (Signed)
ENDOCRINOLOGY CONSULTATION  REFERRING PHYSICIAN:  Demetrios Loll, MD CONSULTING PHYSICIAN:  A. Lavone Orn, MD.  CHIEF COMPLAINT:  Diabetes mellitus  HISTORY OF PRESENT ILLNESS:  70 y.o. male with complicated past medical history including h/o NSCLC, NAFLD, cirrhosis, obesity, DM2, stage 3 CKD admitted from home s/p falls withweakness. Found to have new onset anemia with Hgb 9.9. Seen by GI and is scheduled for EGD tomorrow.  Patient well known to me and follows with me for type 2 diabetes. He was started several years ago on a MiniMed Medtronic insulin pump. He unfortunately has not been compliant with checking sugars or proper use of the pump. Essentially just leaves pump on and changes site about every 5-6 days. This has been his habit for over 6 months despite repeated counseling. His Hgb A1c in 01/2015 was 6.1%. Diabetes is complicated by peripheral neuropathy, retinopathy, and nephropathy with stage 3 CKD. His pump was downloaded and reviewed: Basal settings 12 am 0.50 units/hr                        24-hour total basal = 12 syringe units Humulin R U-500   Bolus settings I:C ratio at 12am 1:15 Sensitivity 50 Target 120 Active insulin time 5 hours  Sugars today were in the 60s fasting. He did not feel poorly when sugar was low. Appetite has been poor. Eats good breakfast most days and little for lunch and supper. Weight down 10 lbs in the last month.    PAST MEDICAL HISTORY:  Past Medical History  Diagnosis Date  . Cataracts, both eyes   . CHF (congestive heart failure) ([place])   . COPD (chronic obstructive pulmonary disease) ([place])   . Detached retina   . Diabetes ([place])   . HTN (hypertension)   . Pleural effusion   . Hyperlipidemia   . IDA (iron deficiency anemia)   . Thrombocytopenia ([place])   . Small cell lung cancer ([place])   . Splenomegaly   . Azotemia   . GI bleed   . Cirrhosis ([place])   . Nephrotic range proteinuria   . Renal insufficiency   . Small cell carcinoma of lung  ([place]) 07/27/2014  . History of chemotherapy     finished July 2015-4 cycles of carbo/taxol  . Ascites      CURRENT MEDICATIONS:  . allopurinol  300 mg Oral Daily  . insulin pump   Subcutaneous TID AC, HS, 0200  . iron polysaccharides  150 mg Oral Q breakfast  . midodrine  5 mg Oral TID WC  . multivitamin with minerals  1 tablet Oral Daily  . pantoprazole (PROTONIX) IV  40 mg Intravenous Q12H  . sucralfate  1 g Oral BID AC  . tamsulosin  0.4 mg Oral QPC breakfast     SOCIAL HISTORY:  Social History  Substance Use Topics  . Smoking status: Former Research scientist (life sciences)  . Smokeless tobacco: None  . Alcohol Use: No     FAMILY HISTORY:   Family History  Problem Relation Age of Onset  . Heart attack Mother   . Heart attack Father      ALLERGIES:  No Known Allergies  REVIEW OF SYSTEMS:  GENERAL:  + weight loss.  No fever.  HEENT:  No blurred vision. No sore throat.  NECK:  No neck pain or dysphagia.  CARDIAC:  No chest pain or palpitation.  PULMONARY:  No cough or shortness of breath.  ABDOMEN:  No abdominal pain.  No constipation.  EXTREMITIES:  No lower extremity swelling.  ENDOCRINE:  No heat or cold intolerance.  GENITOURINARY:  No dysuria or hematuria. SKIN:  No recent rash or skin changes.   PHYSICAL EXAMINATION:  BP 80/41 mmHg  Pulse 83  Temp(Src) 97.7 F (36.5 C) (Oral)  Resp 16  Ht '6\' 2"'$  (1.88 m)  Wt 97.608 kg (215 lb 3 oz)  BMI 27.62 kg/m2  SpO2 100%  GENERAL:  White  male appears chronically ill, in NAD. HEENT:  EOMI.  Oropharynx is clear.  NECK:  Supple.  No thyromegaly.  No neck tenderness.  CARDIAC:  Regular rate and rhythm without murmur.  PULMONARY:  Clear to auscultation bilaterally. No wheeze. ABDOMEN:  Diffusely soft, nontender, nondistended. +BS. EXTREMITIES:  No peripheral edema is present.    SKIN:  No rash or dermatopathy.Pump site is C/D.  NEUROLOGIC:  No dysarthria.  No tremor. PSYCHIATRIC:  Alert and oriented, calm, cooperative.   LABORATORY  DATA:  Results for orders placed or performed during the hospital encounter of 05/09/15 (from the past 24 hour(s))  Glucose, capillary     Status: Abnormal   Collection Time: 05/09/15  9:39 PM  Result Value Ref Range   Glucose-Capillary 176 (H) 65 - 99 mg/dL   Comment 1 Notify RN   Glucose, capillary     Status: Abnormal   Collection Time: 05/10/15  1:14 AM  Result Value Ref Range   Glucose-Capillary 112 (H) 65 - 99 mg/dL   Comment 1 Notify RN   CBC     Status: Abnormal   Collection Time: 05/10/15  4:38 AM  Result Value Ref Range   WBC 2.0 (L) 3.8 - 10.6 K/uL   RBC 3.19 (L) 4.40 - 5.90 MIL/uL   Hemoglobin 9.8 (L) 13.0 - 18.0 g/dL   HCT 29.1 (L) 40.0 - 52.0 %   MCV 91.4 80.0 - 100.0 fL   MCH 30.7 26.0 - 34.0 pg   MCHC 33.6 32.0 - 36.0 g/dL   RDW 20.6 (H) 11.5 - 14.5 %   Platelets 78 (L) 150 - 440 K/uL  Comprehensive metabolic panel     Status: Abnormal   Collection Time: 05/10/15  4:38 AM  Result Value Ref Range   Sodium 134 (L) 135 - 145 mmol/L   Potassium 4.0 3.5 - 5.1 mmol/L   Chloride 105 101 - 111 mmol/L   CO2 21 (L) 22 - 32 mmol/L   Glucose, Bld 63 (L) 65 - 99 mg/dL   BUN 51 (H) 6 - 20 mg/dL   Creatinine, Ser 1.76 (H) 0.61 - 1.24 mg/dL   Calcium 9.0 8.9 - 10.3 mg/dL   Total Protein 7.4 6.5 - 8.1 g/dL   Albumin 3.3 (L) 3.5 - 5.0 g/dL   AST 37 15 - 41 U/L   ALT 25 17 - 63 U/L   Alkaline Phosphatase 148 (H) 38 - 126 U/L   Total Bilirubin 0.9 0.3 - 1.2 mg/dL   GFR calc non Af Amer 38 (L) >60 mL/min   GFR calc Af Amer 44 (L) >60 mL/min   Anion gap 8 5 - 15  Protime-INR     Status: Abnormal   Collection Time: 05/10/15  4:38 AM  Result Value Ref Range   Prothrombin Time 16.3 (H) 11.4 - 15.0 seconds   INR 1.30   Glucose, capillary     Status: None   Collection Time: 05/10/15  8:27 AM  Result Value Ref Range   Glucose-Capillary 66 65 - 99 mg/dL  ASSESSMENT:  S/p fall Anemia Heme + stools Type 2 diabetes with hypoglycemia and long-term use of  insulins Diabetic peripheral neuropathy Diabetic retinopathy Stage 3 CKD Use of insulin pump   PLAN: *Discussed option to change to basal insulin. He prefers to continue with pump. *Counseled him about hypoglycemia.  *Reminded him to check sugars consistently qACHS. *Reviewed use of insulin pump. *Encouraged carb counting and entering all carbs into the pump. *Suggested he fill pump with only 50 units insulin q3 days. Change site and refill reservoir q3 days.  *Adjusted basal rates given morning lows.  New settings: Basal settings 12 am 0.35 units/hr                        24-hour total basal = 8.4 syringe units Humulin R U-500  *Continue low carb diet. *If he has any hypoglycemia (sugars <70) - pump showed be "suspended" and hypoglycemia treated. Then recheck sugar after 15 minutes.  *For planned period of being NPO after midnight and for the EGD, patient can keep the insulin pump in place. However if he needs to be NPO >24 hrs then may need to consider suspending pump, stopping pump, and/or more frequent BG monitoring (eg q3 hrs) if pump is continued.   I am not available over the holiday weekend to round on patient. I am available by page at [phone] or cell [phone] and will return on Monday 05/14/15 to reassess if he remains hospitalized. If patient is discharged before then, arrange for out-patient follow up in 1-2 weeks.  Thank you for the kind request for consultation.  Time spent with patient and family >55 min.

## 2015-05-10 NOTE — Progress Notes (Signed)
Inpatient Diabetes Program Recommendations  AACE/ADA: New Consensus Statement on Inpatient Glycemic Control (2015)  Target Ranges:  Prepandial:   less than 140 mg/dL      Peak postprandial:   less than 180 mg/dL (1-2 hours)      Critically ill patients:  140 - 180 mg/dL   Review of Glycemic Control  Results for [name], [name] (MRN [id number]) as of 05/10/2015 09:02  Ref. Range 05/09/2015 12:59 05/09/2015 21:39 05/10/2015 01:14 05/10/2015 08:27  Glucose-Capillary Latest Ref Range: 65-99 mg/dL 136 (H) 176 (H) 112 (H) 66    Diabetes history: Type 2 Outpatient Diabetes medications: insulin pump- Medtronic 530 G with U- 500 Current orders for Inpatient glycemic control: Insulin pump  Inpatient Diabetes Program Recommendations:   Consider consulting endocrinology- may need to reduce basal rates.  Patient of Dr. Joycie Peek- last seen on 1/ 30/17- see note  70 y.o. male with type 2 diabetes returns for follow-up. Wife is here again with him today. He has a Medtronic 530G insulin pump. He uses Humulin R U-500 insulin in the pump. His current Hgb A1c is 6.1%. Diabetes is complicated by peripheral neuropathy, retinopathy, and nephropathy with stage 3 CKD. His pump was downloaded and reviewed: Basal settings 12 am 0.50 units/hr 24-hour total basal = 12 units Humulin R U-500   Bolus settings I:C ratio at 12am 1:15 Sensitivity 50 Target 120 Active insulin time 5 hours  Unfortunately he is not checking sugars. He is not bolusing at all. He reports that because when he has checked sugars, they are fine, he didn't see the need. He prefers to continue on the pump rather than take insulin injections. Reports hands shake and he doesn't feel he could do the injections.   -Suggested he may consider stopping the pump and replacing with once daily basal insulin injections, however he declines. -Advised him to change to a U-100 insulin. He doesn't wish to make this change now as he still has 2 vials of  U-500 to use up. I advised him not to refill the U-500 again and to call about 2 weeks before he estimates he will run out of this insulin so we can make the change to U-100 insulin.   Please order the insulin pump through the  "insulin pump order set" so staff can have access to needed forms for managing the pump.   Patient is using U500 insulin in his pump and does not always bolus for his meals.   Gentry Fitz, RN, BA, MHA, CDE Diabetes Coordinator Inpatient Diabetes Program  [phone] (Team Pager) [phone] ([place]) 05/10/2015 9:55 AM

## 2015-05-10 NOTE — Progress Notes (Signed)
Initial Nutrition Assessment  DOCUMENTATION CODES:   Non-severe (moderate) malnutrition in context of chronic illness  INTERVENTION:   -Await diet progression as medically able -Provided pt with menu to aid in carbohydrate counting as pt with insulin pump. Pt reports knowing how to carb count with no current questions. Pt also shown grams of carbohydrates printed on tray tickets as well.  -Will recommend Glucerna Shake po BID, each supplement provides 220 kcal and 10 grams of protein, once diet order advanced   NUTRITION DIAGNOSIS:   Malnutrition related to chronic illness as evidenced by moderate depletions of muscle mass, percent weight loss.  GOAL:   Patient will meet greater than or equal to 90% of their needs  MONITOR:   PO intake, Supplement acceptance, Diet advancement, Labs, Weight trends, I & O's  REASON FOR ASSESSMENT:   Malnutrition Screening Tool    ASSESSMENT:   Pt admitted with GI bleed, heme positive and melanotic stools. Pt also with h/o small cell lung cancer not currently on chemo or radiation.  Past Medical History  Diagnosis Date  . Cataracts, both eyes   . CHF (congestive heart failure) ([place])   . COPD (chronic obstructive pulmonary disease) ([place])   . Detached retina   . Diabetes ([place])   . HTN (hypertension)   . Pleural effusion   . Hyperlipidemia   . IDA (iron deficiency anemia)   . Thrombocytopenia ([place])   . Small cell lung cancer ([place])   . Splenomegaly   . Azotemia   . GI bleed   . Cirrhosis ([place])   . Nephrotic range proteinuria   . Renal insufficiency   . Small cell carcinoma of lung ([place]) 07/27/2014  . History of chemotherapy     finished July 2015-4 cycles of carbo/taxol  . Ascites     Diet Order:  Diet clear liquid Room service appropriate?: Yes; Fluid consistency:: Thin   Pt reports drinking grape juice, broth and jello this morning on CL tray and tolerated well.  Pt reports appetite has been doing ok PTA. Pt reports usually  awakening late in the morning and will eat a sizable breakfast late.  Pt reports then having a snack or Glucerna in the afternoon and then eats a balanced dinner with vegetables.    Medications: Allopurinol, Insulin Pump, MVI, Protonix, Carafate, NS at 58m/hr Labs: Glucose 63 this morning, Hgb 9.8   Gastrointestinal Profile: Last BM:  05/08/2015   Nutrition-Focused Physical Exam Findings: Nutrition-Focused physical exam completed. Findings are no fat depletion, mild muscle depletion, and mild edema.    Weight Change: Pt reports weight of 350lbs 2 years ago.  More recently pt reports weight of 226lbs at nephrologists office 2-2.5 weeks ago (5% weight loss in 2 weeks). Per [facility] weight trend, weight loss of 24% in the past year.   Skin:  Reviewed, no issues  Height:   Ht Readings from Last 1 Encounters:  05/09/15 '6\' 2"'$  (1.88 m)    Weight:   Wt Readings from Last 1 Encounters:  05/09/15 215 lb 3 oz (97.608 kg)   Wt Readings from Last 10 Encounters:  05/09/15 215 lb 3 oz (97.608 kg)  03/12/15 236 lb 15.9 oz (107.5 kg)  01/05/15 252 lb 10.4 oz (114.6 kg)  11/03/14 237 lb 12.8 oz (107.865 kg)  07/14/14 251 lb 8.7 oz (114.1 kg)  06/12/14 278 lb 14.4 oz (126.508 kg)  05/03/14 283 lb 8.2 oz (128.6 kg    BMI:  Body mass index is 27.62 kg/(m^2).  Estimated Nutritional Needs:   Kcal:  2000-2500kcals  Protein:  78-98g protein  Fluid:  >2L fluid  EDUCATION NEEDS:   No education needs identified at this time  Dwyane Luo, RD, LDN Pager [phone] Weekend/On-Call Pager [phone]

## 2015-05-10 NOTE — Progress Notes (Addendum)
[place] [facility] [place] NAME: [name]    MR#:  [id number]  DATE OF BIRTH:  [birth year]  SUBJECTIVE:  CHIEF COMPLAINT:   Chief Complaint  Patient presents with  . Fall  dark stool and weakness  REVIEW OF SYSTEMS:  CONSTITUTIONAL: No fever, has generalizedweakness.  EYES: No blurred or double vision.  EARS, NOSE, AND THROAT: No tinnitus or ear pain.  RESPIRATORY: No cough, shortness of breath, wheezing or hemoptysis.  CARDIOVASCULAR: No chest pain, orthopnea, edema.  GASTROINTESTINAL: No nausea, vomiting, diarrhea or abdominal pain. Has melena. GENITOURINARY: No dysuria, hematuria.  ENDOCRINE: No polyuria, nocturia,  HEMATOLOGY: No anemia, easy bruising or bleeding SKIN: No rash or lesion. MUSCULOSKELETAL: No joint pain or arthritis.   NEUROLOGIC: No tingling, numbness, weakness.  PSYCHIATRY: No anxiety or depression.   DRUG ALLERGIES:  No Known Allergies  VITALS:  Blood pressure 80/41, pulse 83, temperature 97.7 F (36.5 C), temperature source Oral, resp. rate 16, height '6\' 2"'$  (1.88 m), weight 97.608 kg (215 lb 3 oz), SpO2 100 %.  PHYSICAL EXAMINATION:  GENERAL:  70 y.o.-year-old patient lying in the bed with no acute distress.  EYES: Pupils equal, round, reactive to light and accommodation. No scleral icterus. Extraocular muscles intact.  HEENT: Head atraumatic, normocephalic. Oropharynx and nasopharynx clear.  NECK:  Supple, no jugular venous distention. No thyroid enlargement, no tenderness.  LUNGS: Normal breath sounds bilaterally, no wheezing, rales,rhonchi or crepitation. No use of accessory muscles of respiration.  CARDIOVASCULAR: S1, S2 normal. No murmurs, rubs, or gallops.  ABDOMEN: Soft, nontender, nondistended. Bowel sounds present. No organomegaly or mass.  EXTREMITIES: No pedal edema, cyanosis, or clubbing.  NEUROLOGIC: Cranial nerves II through XII are intact. Muscle strength 5/5 in all extremities.  Sensation intact. Gait not checked.  PSYCHIATRIC: The patient is alert and oriented x 3.  SKIN: No obvious rash, lesion, or ulcer.    LABORATORY PANEL:   CBC  Recent Labs Lab 05/10/15 0438  WBC 2.0*  HGB 9.8*  HCT 29.1*  PLT 78*   ------------------------------------------------------------------------------------------------------------------  Chemistries   Recent Labs Lab 05/10/15 0438  NA 134*  K 4.0  CL 105  CO2 21*  GLUCOSE 63*  BUN 51*  CREATININE 1.76*  CALCIUM 9.0  AST 37  ALT 25  ALKPHOS 148*  BILITOT 0.9   ------------------------------------------------------------------------------------------------------------------  Cardiac Enzymes  Recent Labs Lab 05/09/15 1257  TROPONINI <0.03   ------------------------------------------------------------------------------------------------------------------  RADIOLOGY:  Dg Chest 2 View  05/09/2015  CLINICAL DATA:  Witnessed fall.  Current history of lung cancer. EXAM: CHEST  2 VIEW COMPARISON:  CT scan of March 08, 2015. Radiograph of March 24, 2014. FINDINGS: Stable cardiomediastinal silhouette. Right internal jugular Port-A-Cath is unchanged in position. Minimal right pleural effusion is noted. No pneumothorax is noted. Stable left perihilar density is noted consistent with radiation fibrosis as described on prior CT scan. Stable rounded right basilar opacity is noted consistent with rounded atelectasis as described on prior CT exam. Bony thorax is unremarkable. IMPRESSION: Minimal right pleural effusion is noted. Stable left perihilar radiation fibrosis. Stable rounded density seen in right lung base most consistent with rounded atelectasis. Electronically Signed   By: Marijo Conception, M.D.   On: 05/09/2015 15:00   Dg Lumbar Spine 2-3 Views  05/09/2015  CLINICAL DATA:  Witnessed fall last night, chronic back pain including today, history lung cancer, diabetes mellitus, hypertension, CHF, COPD EXAM: LUMBAR  SPINE - 2-3 VIEW COMPARISON:  CT pelvis 07/26/2014  FINDINGS: 5 non-rib-bearing lumbar vertebra. Diffuse osseous demineralization. Multilevel disc space narrowing and endplate spur formation. Facet degenerative changes at mid to lower lumbar spine. Vertebral body heights maintained without fracture or subluxation. Bone destruction or spondylolysis. SI joints preserved. Scattered atherosclerotic calcifications. IMPRESSION: Degenerative disc and facet disease changes lumbar spine similar to that identified on preceding CT. No acute abnormalities. Electronically Signed   By: Lavonia Dana M.D.   On: 05/09/2015 15:06   Ct Head Wo Contrast  05/09/2015  CLINICAL DATA:  Weakness.  Small cell lung cancer. EXAM: CT HEAD WITHOUT CONTRAST TECHNIQUE: Contiguous axial images were obtained from the base of the skull through the vertex without intravenous contrast. COMPARISON:  08/18/2013. FINDINGS: No evidence for acute infarction, hemorrhage, mass lesion, or extra-axial fluid. Generalized atrophy. Hydrocephalus ex vacuo. Hypoattenuation of white matter, consistent with small vessel disease. Vascular calcification. Calvarium intact. No sinus or mastoid disease. Negative orbits. Compared with priors, progression of atrophy since 2015. IMPRESSION: Atrophy and small vessel disease.  No acute intracranial findings. Electronically Signed   By: Staci Righter M.D.   On: 05/09/2015 14:51    EKG:   Orders placed or performed during the hospital encounter of 05/09/15  . EKG 12-Lead  . EKG 12-Lead  . ED EKG  . ED EKG    ASSESSMENT AND PLAN:   70 year old male with past medical history of nonalcoholic steatohepatitis, liver cirrhosis, history of previous GI bleed, history of previous MI status post stents, hypertension, hyperlipidemia, chronic anemia/thrombocytopenia, diabetes who presents to the hospital after feeling increasingly weak and having a fall.  1. Upper GI bleed with melana. Hold his Plavix, continue protonix and  carafate, follow hemoglobin and gastroenterology consult.  2. Acute blood loss anemia-patient's baseline hemoglobin is around 11 now down to 9 due to the upper GI bleed. -Follow hemoglobin. Transfuse if hemoglobin is less than 7. Hold Plavix. -Continue iron supplements.  3. Generalized weakness status post fall. physical therap.  4. Diabetes type 2 without complication-continue patient's insulin pump and endo consult.  5. History of gout-no acute attack, continue allopurinol  6. BPH-continue Flomax.  * AKI due to dehydration. Start NS iv, follow up BMP.  * Pancytopenia, chronic. Stable except Hb.   All the records are reviewed and case discussed with Care Management/Social Workerr. Management plans discussed with the patient, family and they are in agreement.  CODE STATUS: full code.  TOTAL TIME TAKING CARE OF THIS PATIENT: 38 minutes.  Greater than 50% time was spent on coordination of care and face-to-face counseling.  POSSIBLE D/C IN 2 DAYS, DEPENDING ON CLINICAL CONDITION.   Demetrios Loll M.D on 05/10/2015 at 11:43 AM  Between 7am to 6pm - Pager - [phone]  After 6pm go to [email or web address] - password [email or web address] [facility]  Office  [phone]  CC: Primary care physician; BABAOFF, Caryl Bis, MD

## 2015-05-10 NOTE — Care Management (Signed)
Admitted to [facility] with the diagnosis of GI Bleed. Lives with wife, Angela Nevin [phone]). Daughter is Lattie Haw [phone]). Last seen Dr. Loney Hering about a year ago. See many specialist per wife. Decreased appetite. Fell 2 days ago. Uses a cane, rolling walker, and wheelchair to aid in ambulation. Uses a recliner chair in the home. No Life Alert. No skilled facility. [facility] in the past "likes [facility]." Self feeds, wife helps with dressing and baths.  Family will transport. Physical therapy evaluation completed. Recommends home with home health/physical in the home. Would like [facility] again. Will need aide in the home. Wife was requesting help in the home. Explained that no insurance would pay for personal care in the home. Gave personal care services list.  Shelbie Ammons RN MSN CCM Care management [phone]

## 2015-05-10 NOTE — Consult Note (Signed)
[facility]  [address], [place], [place] [address] Phone: [phone] Fax : [phone]  Consultation  Referring Provider:     No ref. provider found Primary Care Physician:  Marcello Fennel, MD Primary Gastroenterologist:  Dr. Gustavo Lah         Reason for Consultation:     GI bleeding  Date of Admission:  05/09/2015 Date of Consultation:  05/10/2015         HPI:   [name]. is a 70 y.o. male who has a history of nonalcoholic steatohepatitis and cirrhosis who has been following with Dr. Gustavo Lah in the past. The patient also has a history of lung cancer. For this he has been following with oncology. The patient was admitted now with feeling tired and weak and was found to have a hemoglobin of 9.9 down from 11 one month ago. He states he always has black stools because he takes iron. He also has soft stools because he is on lactulose and MiraLAX. The patient denies any abdominal pain nausea vomiting fevers or chills. The patient was admitted with the diagnosis of a possible GI bleed and was given breakfast and lunch today. The patient was consuming his liquid lunch when I came to see him today. The patient denies any change in his bowel habits. The patient has been on Plavix Protonix and Carafate. The patient also was told that his stools were positive for blood in the emergency department.  Past Medical History  Diagnosis Date  . Cataracts, both eyes   . CHF (congestive heart failure) ([place])   . COPD (chronic obstructive pulmonary disease) ([place])   . Detached retina   . Diabetes ([place])   . HTN (hypertension)   . Pleural effusion   . Hyperlipidemia   . IDA (iron deficiency anemia)   . Thrombocytopenia ([place])   . Small cell lung cancer ([place])   . Splenomegaly   . Azotemia   . GI bleed   . Cirrhosis ([place])   . Nephrotic range proteinuria   . Renal insufficiency   . Small cell carcinoma of lung ([place]) 07/27/2014  . History of chemotherapy     finished  July 2015-4 cycles of carbo/taxol  . Ascites     Past Surgical History  Procedure Laterality Date  . Fistula repair    . Laminectomy    . Cardiac stents    . Colonoscopy  12/18/12    Prior to Admission medications   Medication Sig Start Date End Date Taking? Authorizing Provider  allopurinol (ZYLOPRIM) 300 MG tablet Take 300 mg by mouth daily.   Yes Historical Provider, MD  clopidogrel (PLAVIX) 75 MG tablet Take 75 mg by mouth daily.   Yes Historical Provider, MD  ferrous fumarate-iron polysaccharide complex (TANDEM) 162-115.2 MG CAPS capsule Take 1 capsule by mouth daily with breakfast.   Yes Historical Provider, MD  furosemide (LASIX) 40 MG tablet Take 40 mg by mouth daily.    Yes Historical Provider, MD  Insulin Human (INSULIN PUMP) SOLN Pt uses Humalog insulin.   Yes Historical Provider, MD  lactulose (CHRONULAC) 10 GM/15ML solution Take 30 g by mouth 2 (two) times daily as needed for mild constipation.   Yes Historical Provider, MD  midodrine (PROAMATINE) 5 MG tablet Take 5 mg by mouth 3 (three) times daily with meals.   Yes Historical Provider, MD  Multiple Vitamin (MULTIVITAMIN WITH MINERALS) TABS tablet Take 1 tablet by mouth daily.   Yes Historical Provider, MD  nitroGLYCERIN (NITRODUR - DOSED IN MG/24 HR) 0.4 mg/hr patch Place 0.4 mg onto the skin daily as needed (for chest pain).    Yes Historical Provider, MD  nitroGLYCERIN (NITROSTAT) 0.4 MG SL tablet Place 0.4 mg under the tongue every 5 (five) minutes as needed for chest pain.   Yes Historical Provider, MD  omeprazole (PRILOSEC) 20 MG capsule Take 20 mg by mouth daily.   Yes Historical Provider, MD  polyethylene glycol (MIRALAX / GLYCOLAX) packet Take 17 g by mouth daily as needed for mild constipation.   Yes Historical Provider, MD  spironolactone (ALDACTONE) 100 MG tablet Take 100 mg by mouth daily.    Yes Historical Provider, MD  sucralfate (CARAFATE) 1 G tablet Take 1 g by mouth 2 (two) times daily before a meal.   Yes  Historical Provider, MD  tamsulosin (FLOMAX) 0.4 MG CAPS capsule Take 0.4 mg by mouth daily after breakfast.    Yes Historical Provider, MD    Family History  Problem Relation Age of Onset  . Heart attack Mother   . Heart attack Father      Social History  Substance Use Topics  . Smoking status: Former Research scientist (life sciences)  . Smokeless tobacco: None  . Alcohol Use: No    Allergies as of 05/09/2015  . (No Known Allergies)    Review of Systems:    All systems reviewed and negative except where noted in HPI.   Physical Exam:  Vital signs in last 24 hours: Temp:  [97.4 F (36.3 C)-98.2 F (36.8 C)] 97.7 F (36.5 C) (04/13 1117) Pulse Rate:  [54-83] 83 (04/13 1124) Resp:  [16-26] 16 (04/13 1117) BP: (80-127)/(41-64) 80/41 mmHg (04/13 1124) SpO2:  [100 %] 100 % (04/13 1122) Weight:  [215 lb 3 oz (97.608 kg)] 215 lb 3 oz (97.608 kg) (04/12 1822) Last BM Date: 05/08/15 General:   Pleasant, cooperative in NAD Head:  Normocephalic and atraumatic. Eyes:   No icterus.   Conjunctiva pink. PERRLA. Ears:  Normal auditory acuity. Neck:  Supple; no masses or thyroidomegaly Lungs: Respirations even and unlabored. Lungs clear to auscultation bilaterally.   No wheezes, crackles, or rhonchi.  Heart:  Regular rate and rhythm;  Without murmur, clicks, rubs or gallops Abdomen:  Soft, slightly distended, nontender. Normal bowel sounds. No appreciable masses or hepatomegaly.  No rebound or guarding.  Rectal:  Not performed. Msk:  Symmetrical without gross deformities.    Extremities:  Without edema, cyanosis or clubbing. Neurologic:  Alert and oriented x3;  grossly normal neurologically. Skin:  Intact without significant lesions or rashes. Cervical Nodes:  No significant cervical adenopathy. Psych:  Alert and cooperative. Normal affect.  LAB RESULTS:  Recent Labs  05/09/15 1257 05/10/15 0438  WBC 2.2* 2.0*  HGB 9.4* 9.8*  HCT 28.9* 29.1*  PLT 75* 78*   BMET  Recent Labs  05/09/15 1257  05/10/15 0438  NA 134* 134*  K 3.3* 4.0  CL 111 105  CO2 18* 21*  GLUCOSE 140* 63*  BUN 44* 51*  CREATININE 1.35* 1.76*  CALCIUM 7.6* 9.0   LFT  Recent Labs  05/09/15 1257 05/10/15 0438  PROT 6.2* 7.4  ALBUMIN 2.6* 3.3*  AST 37 37  ALT 23 25  ALKPHOS 124 148*  BILITOT 0.6 0.9  BILIDIR 0.2  --   IBILI 0.4  --    PT/INR  Recent Labs  05/09/15 1257 05/10/15 0438  LABPROT 17.3* 16.3*  INR 1.40 1.30    STUDIES: Dg Chest 2 View  05/09/2015  CLINICAL DATA:  Witnessed fall.  Current history of lung cancer. EXAM: CHEST  2 VIEW COMPARISON:  CT scan of March 08, 2015. Radiograph of March 24, 2014. FINDINGS: Stable cardiomediastinal silhouette. Right internal jugular Port-A-Cath is unchanged in position. Minimal right pleural effusion is noted. No pneumothorax is noted. Stable left perihilar density is noted consistent with radiation fibrosis as described on prior CT scan. Stable rounded right basilar opacity is noted consistent with rounded atelectasis as described on prior CT exam. Bony thorax is unremarkable. IMPRESSION: Minimal right pleural effusion is noted. Stable left perihilar radiation fibrosis. Stable rounded density seen in right lung base most consistent with rounded atelectasis. Electronically Signed   By: Marijo Conception, M.D.   On: 05/09/2015 15:00   Dg Lumbar Spine 2-3 Views  05/09/2015  CLINICAL DATA:  Witnessed fall last night, chronic back pain including today, history lung cancer, diabetes mellitus, hypertension, CHF, COPD EXAM: LUMBAR SPINE - 2-3 VIEW COMPARISON:  CT pelvis 07/26/2014 FINDINGS: 5 non-rib-bearing lumbar vertebra. Diffuse osseous demineralization. Multilevel disc space narrowing and endplate spur formation. Facet degenerative changes at mid to lower lumbar spine. Vertebral body heights maintained without fracture or subluxation. Bone destruction or spondylolysis. SI joints preserved. Scattered atherosclerotic calcifications. IMPRESSION:  Degenerative disc and facet disease changes lumbar spine similar to that identified on preceding CT. No acute abnormalities. Electronically Signed   By: Lavonia Dana M.D.   On: 05/09/2015 15:06   Ct Head Wo Contrast  05/09/2015  CLINICAL DATA:  Weakness.  Small cell lung cancer. EXAM: CT HEAD WITHOUT CONTRAST TECHNIQUE: Contiguous axial images were obtained from the base of the skull through the vertex without intravenous contrast. COMPARISON:  08/18/2013. FINDINGS: No evidence for acute infarction, hemorrhage, mass lesion, or extra-axial fluid. Generalized atrophy. Hydrocephalus ex vacuo. Hypoattenuation of white matter, consistent with small vessel disease. Vascular calcification. Calvarium intact. No sinus or mastoid disease. Negative orbits. Compared with priors, progression of atrophy since 2015. IMPRESSION: Atrophy and small vessel disease.  No acute intracranial findings. Electronically Signed   By: Staci Righter M.D.   On: 05/09/2015 14:51      Impression / Plan:   [name]. is a 70 y.o. y/o male with With a history of cirrhosis and nonalcoholic steatohepatitis. The patient was found to have heme positive stools. The patient has chronically black stools, this iron. The patient did have a drop in his hemoglobin from his baseline 1 month ago of 11 down to 9.8. The patient will be kept nothing by mouth after midnight and will be set up for an EGD for tomorrow since he was given a diet morning for breakfast and lunch. The patient and the family have been explained the plan and agree with it.   Thank you for involving me in the care of this patient.      LOS: 1 day   Ollen Bowl, MD  05/10/2015, 1:18 PM   Note: This dictation was prepared with Dragon dictation along with smaller phrase technology. Any transcriptional errors that result from this process are unintentional.

## 2015-05-11 ENCOUNTER — Inpatient Hospital Stay: Payer: Medicare Other | Admitting: Anesthesiology

## 2015-05-11 ENCOUNTER — Encounter
Admission: EM | Disposition: A | Discharge status: Home Health | Payer: Self-pay | Source: Home / Self Care | Attending: Internal Medicine

## 2015-05-11 DIAGNOSIS — R161 Splenomegaly, not elsewhere classified: Secondary | ICD-10-CM

## 2015-05-11 DIAGNOSIS — D61818 Other pancytopenia: Secondary | ICD-10-CM

## 2015-05-11 DIAGNOSIS — K3189 Other diseases of stomach and duodenum: Secondary | ICD-10-CM

## 2015-05-11 DIAGNOSIS — I509 Heart failure, unspecified: Secondary | ICD-10-CM

## 2015-05-11 DIAGNOSIS — D72819 Decreased white blood cell count, unspecified: Secondary | ICD-10-CM

## 2015-05-11 DIAGNOSIS — E440 Moderate protein-calorie malnutrition: Secondary | ICD-10-CM | POA: Insufficient documentation

## 2015-05-11 DIAGNOSIS — D696 Thrombocytopenia, unspecified: Secondary | ICD-10-CM

## 2015-05-11 DIAGNOSIS — R7989 Other specified abnormal findings of blood chemistry: Secondary | ICD-10-CM

## 2015-05-11 DIAGNOSIS — Z87891 Personal history of nicotine dependence: Secondary | ICD-10-CM

## 2015-05-11 DIAGNOSIS — Z85118 Personal history of other malignant neoplasm of bronchus and lung: Secondary | ICD-10-CM

## 2015-05-11 DIAGNOSIS — K766 Portal hypertension: Secondary | ICD-10-CM

## 2015-05-11 DIAGNOSIS — M549 Dorsalgia, unspecified: Secondary | ICD-10-CM

## 2015-05-11 DIAGNOSIS — Z9221 Personal history of antineoplastic chemotherapy: Secondary | ICD-10-CM

## 2015-05-11 DIAGNOSIS — I8500 Esophageal varices without bleeding: Secondary | ICD-10-CM

## 2015-05-11 DIAGNOSIS — Z79899 Other long term (current) drug therapy: Secondary | ICD-10-CM

## 2015-05-11 DIAGNOSIS — J449 Chronic obstructive pulmonary disease, unspecified: Secondary | ICD-10-CM

## 2015-05-11 DIAGNOSIS — D509 Iron deficiency anemia, unspecified: Secondary | ICD-10-CM

## 2015-05-11 DIAGNOSIS — N289 Disorder of kidney and ureter, unspecified: Secondary | ICD-10-CM

## 2015-05-11 DIAGNOSIS — R809 Proteinuria, unspecified: Secondary | ICD-10-CM

## 2015-05-11 HISTORY — PX: ESOPHAGOGASTRODUODENOSCOPY (EGD) WITH PROPOFOL: SHX5813

## 2015-05-11 LAB — BASIC METABOLIC PANEL
Anion gap: 6 (ref 5–15)
BUN: 41 mg/dL — AB (ref 6–20)
CO2: 20 mmol/L — AB (ref 22–32)
Calcium: 8.2 mg/dL — ABNORMAL LOW (ref 8.9–10.3)
Chloride: 109 mmol/L (ref 101–111)
Creatinine, Ser: 1.4 mg/dL — ABNORMAL HIGH (ref 0.61–1.24)
GFR calc Af Amer: 58 mL/min — ABNORMAL LOW (ref >60)
GFR, EST NON AFRICAN AMERICAN: 50 mL/min — AB (ref >60)
GLUCOSE: 82 mg/dL (ref 65–99)
POTASSIUM: 3.8 mmol/L (ref 3.5–5.1)
Sodium: 135 mmol/L (ref 135–145)

## 2015-05-11 LAB — CBC
HEMATOCRIT: 26.5 % — AB (ref 40.0–52.0)
HEMOGLOBIN: 8.7 g/dL — AB (ref 13.0–18.0)
MCH: 30.4 pg (ref 26.0–34.0)
MCHC: 32.7 g/dL (ref 32.0–36.0)
MCV: 92.8 fL (ref 80.0–100.0)
Platelets: 71 10*3/uL — ABNORMAL LOW (ref 150–440)
RBC: 2.86 MIL/uL — ABNORMAL LOW (ref 4.40–5.90)
RDW: 20.6 % — AB (ref 11.5–14.5)
WBC: 1.4 10*3/uL — CL (ref 3.8–10.6)

## 2015-05-11 LAB — GLUCOSE, CAPILLARY
GLUCOSE-CAPILLARY: 74 mg/dL (ref 65–99)
Glucose-Capillary: 84 mg/dL (ref 65–99)
Glucose-Capillary: 101 mg/dL — ABNORMAL HIGH (ref 65–99)
Glucose-Capillary: 155 mg/dL — ABNORMAL HIGH (ref 65–99)

## 2015-05-11 SURGERY — ESOPHAGOGASTRODUODENOSCOPY (EGD) WITH PROPOFOL
Anesthesia: General

## 2015-05-11 MED ORDER — PROPOFOL 10 MG/ML IV BOLUS
INTRAVENOUS | Status: DC | PRN
  Administered 2015-05-11: 50 mg via INTRAVENOUS

## 2015-05-11 MED ORDER — LIDOCAINE HCL (CARDIAC) 20 MG/ML IV SOLN
INTRAVENOUS | Status: DC | PRN
  Administered 2015-05-11: 100 mg via INTRAVENOUS

## 2015-05-11 MED ORDER — SODIUM CHLORIDE 0.9 % IV SOLN
INTRAVENOUS | Status: DC
  Administered 2015-05-11: 12:00:00 via INTRAVENOUS

## 2015-05-11 MED ORDER — NADOLOL 20 MG PO TABS
20.0000 mg | ORAL_TABLET | Freq: Every day | ORAL | Status: DC
  Administered 2015-05-11 – 2015-05-12 (×2): 20 mg via ORAL
  Filled 2015-05-11 (×3): qty 1

## 2015-05-11 MED ORDER — PROPOFOL 500 MG/50ML IV EMUL
INTRAVENOUS | Status: DC | PRN
Start: 2015-05-11 — End: 2015-05-11
  Administered 2015-05-11: 100 ug/kg/min via INTRAVENOUS

## 2015-05-11 MED ORDER — SUCRALFATE 1 G PO TABS
1.0000 g | ORAL_TABLET | Freq: Three times a day (TID) | ORAL | Status: DC
  Administered 2015-05-11 – 2015-05-12 (×4): 1 g via ORAL
  Filled 2015-05-11 (×5): qty 1

## 2015-05-11 NOTE — Progress Notes (Signed)
PT Cancellation Note  Patient Details Name: [name]. MRN: [id number] DOB: 1945/04/05   Cancelled Treatment:    Reason Eval/Treat Not Completed: Other (comment). Chart review, treatment attempted. Pt sleeping upon entry, awakens easily. Pt reports he still feels very lethargic since EGD this morning. Asks that he be allowed to rest today, rather than participate in therpy. He reports he has been exercising his legs in bed. I encouraged pt to move to chair with nursing for dinner tonight.    3:13 PM, 05/11/2015 Etta Grandchild, PT, DPT PRN Physical Therapist - [place] License # [id number] [phone] (mobile)

## 2015-05-11 NOTE — Op Note (Signed)
Pt of Dr. Gustavo Lah. Seen by Dr. Allen Norris yesterday. No active bleeding. EGD showed esophageal varices and portal gastropathy. No evidence of bleeding from esophageal varices. Pt does have gastritis. Some old blood seen in antrum, which easily washed off. Due to low platelets and very recent plavix use, decided against doing biopsies. Continue protonix bid. Increase carafate to QID. Add nadolol '20mg'$  daily to decrease portal pressures and moniter BP/HR. Full liquid diet. If active bleeding in the future, then will consider esophageal banding. Will follow. Thanks.

## 2015-05-11 NOTE — Transfer of Care (Signed)
Immediate Anesthesia Transfer of Care Note  Patient: [name].  Procedure(s) Performed: Procedure(s): ESOPHAGOGASTRODUODENOSCOPY (EGD) WITH PROPOFOL (N/A)  Patient Location: PACU and Endoscopy Unit  Anesthesia Type:General  Level of Consciousness: sedated  Airway & Oxygen Therapy: Patient Spontanous Breathing and Patient connected to nasal cannula oxygen  Post-op Assessment: Report given to RN and Post -op Vital signs reviewed and stable  Post vital signs: stable  Last Vitals:  Filed Vitals:   05/11/15 0844 05/11/15 0936  BP: 121/56 101/58  Pulse: 58 56  Temp: 36.4 C 36.4 C  Resp: 18 12    Complications: No apparent anesthesia complications

## 2015-05-11 NOTE — Clinical Documentation Improvement (Signed)
Internal Medicine  Can the diagnosis of CHF be further specified?    Acuity - Acute, Chronic, Acute on Chronic   Type - Systolic, Diastolic, Systolic and Diastolic  Other  Clinically Undetermined   Please exercise your independent, professional judgment when responding. A specific answer is not anticipated or expected.   Thank You,  Rolm Gala, RN, [place] [phone]

## 2015-05-11 NOTE — Plan of Care (Signed)
Problem: Nutrition: Goal: Adequate nutrition will be maintained Outcome: Progressing Patient NPO since midnight for scheduled EGD on 05/11/15.  Consent signed and in chart.

## 2015-05-11 NOTE — Anesthesia Preprocedure Evaluation (Addendum)
Anesthesia Evaluation  Patient identified by MRN, date of birth, ID band Patient awake    Reviewed: Allergy & Precautions, NPO status , Patient's Chart, lab work & pertinent test results  History of Anesthesia Complications Negative for: history of anesthetic complications  Airway Mallampati: II   Neck ROM: Full    Dental  (+) Upper Dentures, Lower Dentures   Pulmonary shortness of breath and with exertion, COPD,  COPD inhaler, former smoker,           Cardiovascular hypertension, + CAD and +CHF       Neuro/Psych    GI/Hepatic (+) Cirrhosis       ,   Endo/Other  diabetes, Insulin Dependent  Renal/GU Renal InsufficiencyRenal disease     Musculoskeletal   Abdominal   Peds  Hematology  (+) Blood dyscrasia, anemia ,   Anesthesia Other Findings   Reproductive/Obstetrics                          Anesthesia Physical Anesthesia Plan  ASA: III  Anesthesia Plan: General   Post-op Pain Management:    Induction: Intravenous  Airway Management Planned: Nasal Cannula  Additional Equipment:   Intra-op Plan:   Post-operative Plan:   Informed Consent: I have reviewed the patients History and Physical, chart, labs and discussed the procedure including the risks, benefits and alternatives for the proposed anesthesia with the patient or authorized representative who has indicated his/her understanding and acceptance.     Plan Discussed with: CRNA  Anesthesia Plan Comments:         Anesthesia Quick Evaluation

## 2015-05-11 NOTE — Anesthesia Postprocedure Evaluation (Signed)
Anesthesia Post Note  Patient: [name].  Procedure(s) Performed: Procedure(s) (LRB): ESOPHAGOGASTRODUODENOSCOPY (EGD) WITH PROPOFOL (N/A)  Patient location during evaluation: PACU Anesthesia Type: General Level of consciousness: awake and alert Pain management: pain level controlled Vital Signs Assessment: post-procedure vital signs reviewed and stable Respiratory status: spontaneous breathing and respiratory function stable Cardiovascular status: stable Anesthetic complications: no    Last Vitals:  Filed Vitals:   05/11/15 0940 05/11/15 0950  BP: 101/58   Pulse: 59 53  Temp: 36.4 C   Resp: 20 17    Last Pain:  Filed Vitals:   05/11/15 0957  PainSc: 0-No pain                 KEPHART,WILLIAM K

## 2015-05-11 NOTE — Progress Notes (Signed)
Inpatient Diabetes Program Recommendations  AACE/ADA: New Consensus Statement on Inpatient Glycemic Control (2015)  Target Ranges:  Prepandial:   less than 140 mg/dL      Peak postprandial:   less than 180 mg/dL (1-2 hours)      Critically ill patients:  140 - 180 mg/dL     Inpatient Diabetes Program Recommendations:  Patient in endoscopy at this time.  I do not see reference to insulin pump on the anesthesia report.  Notified endoscopy suite and floor staff re: Dr. Joycie Peek note re: pump directions .  Encouraged to call with questions/assistance.    Gentry Fitz, RN, BA, MHA, CDE Diabetes Coordinator Inpatient Diabetes Program  [phone] (Team Pager) [phone] ([place]) 05/11/2015 10:22 AM

## 2015-05-11 NOTE — Care Management Important Message (Signed)
Important Message  Patient Details  Name: [name]. MRN: [id number] Date of Birth: Dec 14, 1945   Medicare Important Message Given:  Yes    Juliann Pulse A Tagen Milby 05/11/2015, 11:02 AM

## 2015-05-11 NOTE — Op Note (Signed)
[facility] Gastroenterology Patient Name: [name] Procedure Date: 05/11/2015 8:58 AM MRN: [id number] Account #: [id number] Date of Birth: Sep 23, 1945 Admit Type: Inpatient Age: 70 Room: [facility] ENDO ROOM 4 Gender: Male Note Status: Finalized Procedure:            Upper GI endoscopy Indications:          Iron deficiency anemia secondary to chronic blood loss,                        Melena, hx of liver cirrhosis Providers:            Lupita Dawn. Candace Cruise, MD Medicines:            Monitored Anesthesia Care Complications:        No immediate complications. Procedure:            Pre-Anesthesia Assessment:                       - Prior to the procedure, a History and Physical was                        performed, and patient medications, allergies and                        sensitivities were reviewed. The patient's tolerance of                        previous anesthesia was reviewed.                       - The risks and benefits of the procedure and the                        sedation options and risks were discussed with the                        patient. All questions were answered and informed                        consent was obtained.                       - After reviewing the risks and benefits, the patient                        was deemed in satisfactory condition to undergo the                        procedure.                       After obtaining informed consent, the endoscope was                        passed under direct vision. Throughout the procedure,                        the patient's blood pressure, pulse, and oxygen                        saturations were monitored continuously. The  Colonoscope was introduced through the and advanced to                        the second part of duodenum. The upper GI endoscopy was                        accomplished without difficulty. The patient tolerated                        the  procedure well. Findings:      Grade II varices were found in the lower third of the esophagus. They       were small in size. No eivdence of bleeding from the varices      The exam was otherwise without abnormality.      Mild portal hypertensive gastropathy was found in the gastric fundus.      Localized mild inflammation with hemorrhage characterized by erythema       was found in the gastric antrum. Some old blood seen in antrum, which       easily washed off. No active bleeding. Due to low platelets and recent       plavix, elected not to do any biopsies      The exam of the stomach was otherwise normal.      The examined duodenum was normal. Impression:           - Grade II esophageal varices.                       - The examination was otherwise normal.                       - Portal hypertensive gastropathy.                       - Gastritis with hemorrhage.                       - Normal examined duodenum.                       - No specimens collected. Recommendation:       - Full liquid diet today.                       - The findings and recommendations were discussed with                        the patient.                       - Continue protonix bid. Increase carafate to QID.                        Start low dose nadolol. If active bleeding later,                        consider esophageal banding. Procedure Code(s):    --- Professional ---                       [phone], Esophagogastroduodenoscopy, flexible, transoral;  diagnostic, including collection of specimen(s) by                        brushing or washing, when performed (separate procedure) Diagnosis Code(s):    --- Professional ---                       I85.00, Esophageal varices without bleeding                       K76.6, Portal hypertension                       K31.89, Other diseases of stomach and duodenum                       K29.71, Gastritis, unspecified, with bleeding                        D50.0, Iron deficiency anemia secondary to blood loss                        (chronic)                       K92.1, Melena (includes Hematochezia) CPT copyright 2016 American Medical Association. All rights reserved. The codes documented in this report are preliminary and upon coder review may  be revised to meet current compliance requirements. Hulen Luster, MD 05/11/2015 9:46:26 AM This report has been signed electronically. Number of Addenda: 0 Note Initiated On: 05/11/2015 8:58 AM      [facility]

## 2015-05-11 NOTE — Progress Notes (Addendum)
[place] at [place] NAME: [name]    MR#:  [id number]  DATE OF BIRTH:  11-Jun-1945  SUBJECTIVE:  CHIEF COMPLAINT:   Chief Complaint  Patient presents with  . Fall  No complaint.  REVIEW OF SYSTEMS:  CONSTITUTIONAL: No fever, has generalizedweakness.  EYES: No blurred or double vision.  EARS, NOSE, AND THROAT: No tinnitus or ear pain.  RESPIRATORY: No cough, shortness of breath, wheezing or hemoptysis.  CARDIOVASCULAR: No chest pain, orthopnea, edema.  GASTROINTESTINAL: No nausea, vomiting, diarrhea or abdominal pain. Has melena. GENITOURINARY: No dysuria, hematuria.  ENDOCRINE: No polyuria, nocturia,  HEMATOLOGY: No anemia, easy bruising or bleeding SKIN: No rash or lesion. MUSCULOSKELETAL: No joint pain or arthritis.   NEUROLOGIC: No tingling, numbness, weakness.  PSYCHIATRY: No anxiety or depression.   DRUG ALLERGIES:  No Known Allergies  VITALS:  Blood pressure 116/52, pulse 59, temperature 97.5 F (36.4 C), temperature source Oral, resp. rate 20, height '6\' 2"'$  (1.88 m), weight 97.608 kg (215 lb 3 oz), SpO2 100 %.  PHYSICAL EXAMINATION:  GENERAL:  70 y.o.-year-old patient lying in the bed with no acute distress.  EYES: Pupils equal, round, reactive to light and accommodation. No scleral icterus. Extraocular muscles intact.  HEENT: Head atraumatic, normocephalic. Oropharynx and nasopharynx clear.  NECK:  Supple, no jugular venous distention. No thyroid enlargement, no tenderness.  LUNGS: Normal breath sounds bilaterally, no wheezing, rales,rhonchi or crepitation. No use of accessory muscles of respiration.  CARDIOVASCULAR: S1, S2 normal. No murmurs, rubs, or gallops.  ABDOMEN: Soft, nontender, nondistended. Bowel sounds present. No organomegaly or mass.  EXTREMITIES: No pedal edema, cyanosis, or clubbing.  NEUROLOGIC: Cranial nerves II through XII are intact. Muscle strength 5/5 in all extremities. Sensation  intact. Gait not checked.  PSYCHIATRIC: The patient is alert and oriented x 3.  SKIN: No obvious rash, lesion, or ulcer.    LABORATORY PANEL:   CBC  Recent Labs Lab 05/11/15 0526  WBC 1.4*  HGB 8.7*  HCT 26.5*  PLT 71*   ------------------------------------------------------------------------------------------------------------------  Chemistries   Recent Labs Lab 05/10/15 0438 05/11/15 0526  NA 134* 135  K 4.0 3.8  CL 105 109  CO2 21* 20*  GLUCOSE 63* 82  BUN 51* 41*  CREATININE 1.76* 1.40*  CALCIUM 9.0 8.2*  AST 37  --   ALT 25  --   ALKPHOS 148*  --   BILITOT 0.9  --    ------------------------------------------------------------------------------------------------------------------  Cardiac Enzymes  Recent Labs Lab 05/09/15 1257  TROPONINI <0.03   ------------------------------------------------------------------------------------------------------------------  RADIOLOGY:  Dg Chest 2 View  05/09/2015  CLINICAL DATA:  Witnessed fall.  Current history of lung cancer. EXAM: CHEST  2 VIEW COMPARISON:  CT scan of March 08, 2015. Radiograph of March 24, 2014. FINDINGS: Stable cardiomediastinal silhouette. Right internal jugular Port-A-Cath is unchanged in position. Minimal right pleural effusion is noted. No pneumothorax is noted. Stable left perihilar density is noted consistent with radiation fibrosis as described on prior CT scan. Stable rounded right basilar opacity is noted consistent with rounded atelectasis as described on prior CT exam. Bony thorax is unremarkable. IMPRESSION: Minimal right pleural effusion is noted. Stable left perihilar radiation fibrosis. Stable rounded density seen in right lung base most consistent with rounded atelectasis. Electronically Signed   By: Marijo Conception, M.D.   On: 05/09/2015 15:00   Dg Lumbar Spine 2-3 Views  05/09/2015  CLINICAL DATA:  Witnessed fall last night, chronic back pain including today,  history lung  cancer, diabetes mellitus, hypertension, CHF, COPD EXAM: LUMBAR SPINE - 2-3 VIEW COMPARISON:  CT pelvis 07/26/2014 FINDINGS: 5 non-rib-bearing lumbar vertebra. Diffuse osseous demineralization. Multilevel disc space narrowing and endplate spur formation. Facet degenerative changes at mid to lower lumbar spine. Vertebral body heights maintained without fracture or subluxation. Bone destruction or spondylolysis. SI joints preserved. Scattered atherosclerotic calcifications. IMPRESSION: Degenerative disc and facet disease changes lumbar spine similar to that identified on preceding CT. No acute abnormalities. Electronically Signed   By: Lavonia Dana M.D.   On: 05/09/2015 15:06   Ct Head Wo Contrast  05/09/2015  CLINICAL DATA:  Weakness.  Small cell lung cancer. EXAM: CT HEAD WITHOUT CONTRAST TECHNIQUE: Contiguous axial images were obtained from the base of the skull through the vertex without intravenous contrast. COMPARISON:  08/18/2013. FINDINGS: No evidence for acute infarction, hemorrhage, mass lesion, or extra-axial fluid. Generalized atrophy. Hydrocephalus ex vacuo. Hypoattenuation of white matter, consistent with small vessel disease. Vascular calcification. Calvarium intact. No sinus or mastoid disease. Negative orbits. Compared with priors, progression of atrophy since 2015. IMPRESSION: Atrophy and small vessel disease.  No acute intracranial findings. Electronically Signed   By: Staci Righter M.D.   On: 05/09/2015 14:51    EKG:   Orders placed or performed during the hospital encounter of 05/09/15  . EKG 12-Lead  . EKG 12-Lead  . ED EKG  . ED EKG    ASSESSMENT AND PLAN:   70 year old male with past medical history of nonalcoholic steatohepatitis, liver cirrhosis, history of previous GI bleed, history of previous MI status post stents, hypertension, hyperlipidemia, chronic anemia/thrombocytopenia, diabetes who presents to the hospital after feeling increasingly weak and having a fall.  1.  Upper GI bleed with melana EGD showed esophageal varices and portal gastropathy. No evidence of bleeding from esophageal varices. Pt does have gastritis.  Hold his Plavix, continue protonix BID and carafate QID. Add nadolol '20mg'$  daily to decrease portal pressures and moniter BP/HR. Full liquid diet per Dr. Candace Cruise.  2. Acute blood loss anemia-patient's baseline hemoglobin is around 11 now down to 9 due to the upper GI bleed. hemoglobin down to 8.7. Transfuse if hemoglobin is less than 7. Hold Plavix. -Continue iron supplements, f/u Hb.  3. Generalized weakness status post fall. physical therap.  4. Diabetes type 2 without complication. The patient wants to cotinue insulin pump. follow up Dr. Gabriel Carina in 1-2 weeks.  5. History of gout-no acute attack, continue allopurinol  6. BPH-continue Flomax.  * AKI on CKD stage 3,  due to dehydration. Improving. Discontinue NS iv.  * Pancytopenia, chronic.  * worsening leukopenia, f/u CBC and hematology consult.  He needs HHPT.  All the records are reviewed and case discussed with Care Management/Social Workerr. Management plans discussed with the patient, his wife nd they are in agreement.  CODE STATUS: full code.  TOTAL TIME TAKING CARE OF THIS PATIENT: 38 minutes.  Greater than 50% time was spent on coordination of care and face-to-face counseling.  POSSIBLE D/C IN 2 DAYS, DEPENDING ON CLINICAL CONDITION.   Demetrios Loll M.D on 05/11/2015 at 2:12 PM  Between 7am to 6pm - Pager - [phone]  After 6pm go to [email or web address] - password [email or web address] [facility]  Office  [phone]  CC: Primary care physician; BABAOFF, Caryl Bis, MD

## 2015-05-12 LAB — BASIC METABOLIC PANEL
Anion gap: 5 (ref 5–15)
BUN: 35 mg/dL — AB (ref 6–20)
CHLORIDE: 106 mmol/L (ref 101–111)
CO2: 21 mmol/L — AB (ref 22–32)
CREATININE: 1.34 mg/dL — AB (ref 0.61–1.24)
Calcium: 8.4 mg/dL — ABNORMAL LOW (ref 8.9–10.3)
GFR calc Af Amer: >60 mL/min (ref >60)
GFR calc non Af Amer: 53 mL/min — ABNORMAL LOW (ref >60)
GLUCOSE: 82 mg/dL (ref 65–99)
POTASSIUM: 4 mmol/L (ref 3.5–5.1)
Sodium: 132 mmol/L — ABNORMAL LOW (ref 135–145)

## 2015-05-12 LAB — CBC
HEMATOCRIT: 28.1 % — AB (ref 40.0–52.0)
Hemoglobin: 9.1 g/dL — ABNORMAL LOW (ref 13.0–18.0)
MCH: 30.2 pg (ref 26.0–34.0)
MCHC: 32.4 g/dL (ref 32.0–36.0)
MCV: 93 fL (ref 80.0–100.0)
Platelets: 76 10*3/uL — ABNORMAL LOW (ref 150–440)
RBC: 3.02 MIL/uL — ABNORMAL LOW (ref 4.40–5.90)
RDW: 20 % — AB (ref 11.5–14.5)
WBC: 1.7 10*3/uL — AB (ref 3.8–10.6)

## 2015-05-12 LAB — GLUCOSE, CAPILLARY
Glucose-Capillary: 103 mg/dL — ABNORMAL HIGH (ref 65–99)
Glucose-Capillary: 80 mg/dL (ref 65–99)
Glucose-Capillary: 112 mg/dL — ABNORMAL HIGH (ref 65–99)

## 2015-05-12 MED ORDER — PANTOPRAZOLE SODIUM 40 MG PO TBEC: 40.0000 mg | DELAYED_RELEASE_TABLET | Freq: Two times a day (BID) | ORAL | Status: AC

## 2015-05-12 MED ORDER — SUCRALFATE 1 G PO TABS: 1.0000 g | ORAL_TABLET | Freq: Four times a day (QID) | ORAL | Status: AC

## 2015-05-12 MED ORDER — HEPARIN SOD (PORK) LOCK FLUSH 100 UNIT/ML IV SOLN
500.0000 [IU] | Freq: Once | INTRAVENOUS | Status: DC
  Filled 2015-05-12: qty 5

## 2015-05-12 NOTE — Consult Note (Signed)
  GI Inpatient Follow-up Note  Patient Identification: Oaklen Thiam. is a 70 y.o. male  Subjective: No further bleeding. Hgb stable. Tolerating full liquid diet. Nadolol started. SBP 90's.  WBC going up.  Scheduled Inpatient Medications:  . allopurinol  300 mg Oral Daily  . insulin pump   Subcutaneous TID AC, HS, 0200  . iron polysaccharides  150 mg Oral Q breakfast  . midodrine  5 mg Oral TID WC  . multivitamin with minerals  1 tablet Oral Daily  . nadolol  20 mg Oral Daily  . pantoprazole (PROTONIX) IV  40 mg Intravenous Q12H  . sucralfate  1 g Oral TID WC & HS  . tamsulosin  0.4 mg Oral QPC breakfast    Continuous Inpatient Infusions:     PRN Inpatient Medications:  acetaminophen **OR** acetaminophen, morphine injection, nitroGLYCERIN, ondansetron **OR** ondansetron (ZOFRAN) IV, polyethylene glycol  Review of Systems: Constitutional: Weight is stable.  Eyes: No changes in vision. ENT: No oral lesions, sore throat.  GI: see HPI.  Heme/Lymph: No easy bruising.  CV: No chest pain.  GU: No hematuria.  Integumentary: No rashes.  Neuro: No headaches.  Psych: No depression/anxiety.  Endocrine: No heat/cold intolerance.  Allergic/Immunologic: No urticaria.  Resp: No cough, SOB.  Musculoskeletal: No joint swelling.    Physical Examination: BP 96/53 mmHg  Pulse 46  Temp(Src) 97.6 F (36.4 C) (Oral)  Resp 16  Ht '6\' 2"'$  (1.88 m)  Wt 97.608 kg (215 lb 3 oz)  BMI 27.62 kg/m2  SpO2 100% Gen: NAD, alert and oriented x 4 HEENT: PEERLA, EOMI, Neck: supple, no JVD or thyromegaly Chest: CTA bilaterally, no wheezes, crackles, or other adventitious sounds CV: RRR, no m/g/c/r Abd: soft, NT, ND, +BS in all four quadrants; no HSM, guarding, ridigity, or rebound tenderness Ext: no edema, well perfused with 2+ pulses, Skin: no rash or lesions noted Lymph: no LAD  Data: Lab Results  Component Value Date   WBC 1.7* 05/12/2015   HGB 9.1* 05/12/2015   HCT 28.1* 05/12/2015    MCV 93.0 05/12/2015   PLT 76* 05/12/2015    Recent Labs Lab 05/10/15 0438 05/11/15 0526 05/12/15 0618  HGB 9.8* 8.7* 9.1*   Lab Results  Component Value Date   NA 132* 05/12/2015   K 4.0 05/12/2015   CL 106 05/12/2015   CO2 21* 05/12/2015   BUN 35* 05/12/2015   CREATININE 1.34* 05/12/2015   Lab Results  Component Value Date   ALT 25 05/10/2015   AST 37 05/10/2015   ALKPHOS 148* 05/10/2015   BILITOT 0.9 05/10/2015    Recent Labs Lab 05/10/15 0438  INR 1.30   Assessment/Plan: Mr. Bancroft is a 70 y.o. male  With recent UGI bleeding.  Recommendations: Advance to solids. If tolerated, then pt can be discharged from GI point of view. Continue protonix bid and carafate qid. Continue nadolol '20mg'$  daily for now. Have pt f/u with Dr. Gustavo Lah  As outpt. Will sign offf. Thanks. Please call with questions or concerns.  Syrai Gladwin, Lupita Dawn, MD

## 2015-05-12 NOTE — Discharge Instructions (Signed)
Heart healthy and ADA diet. Activity as tolerated. HHPT

## 2015-05-12 NOTE — Care Management Note (Signed)
Case Management Note  Patient Details  Name: [name]. MRN: [id number] Date of Birth: 09-24-1945  Subjective/Objective:  Request for home health Rn and PT was faxed to [facility] today. Advanced will contact Mr Wirtanen at home within 24-48 hours to set up an appointment.                   Action/Plan:   Expected Discharge Date:                  Expected Discharge Plan:     In-House Referral:     Discharge planning Services     Post Acute Care Choice:    Choice offered to:     DME Arranged:    DME Agency:     HH Arranged:    Will Agency:     Status of Service:     Medicare Important Message Given:  Yes Date Medicare IM Given:    Medicare IM give by:    Date Additional Medicare IM Given:    Additional Medicare Important Message give by:     If discussed at [place] of Stay Meetings, dates discussed:    Additional Comments:  Raynee Mccasland A, RN 05/12/2015, 10:04 AM

## 2015-05-12 NOTE — Consult Note (Signed)
[facility]  Date of admission:  05/09/2015  Inpatient day:  05/11/2015  Consulting physician:  Dr. Demetrios Loll   Reason for Consultation:  Leukopenia.  Chief Complaint: [name]. is a 70 y.o. male a history of small cell lung cancer and cirrhosis who was admitted with an upper GI bleed.  HPI:  The patient has a history of limited stage small cell lung cancer s/p 4 cycles of carboplatin and etoposide and radiation.  Chest CT on 03/08/2015 revealed stable LUL radiation changes without evidence of disease.  He has known pancytopenia secondary to cirrhosis, portal hypertension, and splenomegaly.  He notes back pain for 2-3 weeks.  During this time, he became "weaker and weaker" until he fell.  He presented to the emergency room after his fall.  He was noted to have a hemoglobin of 9.4 with a prior value of 11.  Rectal exam revealed guaiac positive stool.  He denies any hematochezia, but notes dark stools on oral iron.  He underwent upper endoscopy on 05/11/2015 by Dr. Verdie Shire.  He was noted to have grade 2 esophageal varices, portal hypertensive gastropathy, gastritis without hemorrhage and a normal duodenum. Recommendation was for continued Protonix twice a day and increased Carafate to 4 times a day.  Low dose nadolol was to be started.  If there was any active bleeding, consideration was made for esophageal banding.  CBC on 03/12/2015 included a hematocrit 33.5, hemoglobin 11, platelets 79,000, white count 2700.  CBC on admission included a hematocrit 28.9, hemoglobin 9.4, platelets 75,000, white count 2200.  CBC on 05/11/2015 included a hematocrit of 26.5, hemoglobin 8.7, platelets 71,000 and white count 1400.  The patient denies any new medications or herbal products.  Regarding his diet, he notes sporadic eating.  He describes "sometimes eating not at all".  He believes his appetite is affected by his lactulose and Miralax.   Past Medical History  Diagnosis  Date  . Cataracts, both eyes   . CHF (congestive heart failure) ([place])   . COPD (chronic obstructive pulmonary disease) ([place])   . Detached retina   . Diabetes ([place])   . HTN (hypertension)   . Pleural effusion   . Hyperlipidemia   . IDA (iron deficiency anemia)   . Thrombocytopenia ([place])   . Small cell lung cancer ([place])   . Splenomegaly   . Azotemia   . GI bleed   . Cirrhosis ([place])   . Nephrotic range proteinuria   . Renal insufficiency   . Small cell carcinoma of lung ([place]) 07/27/2014  . History of chemotherapy     finished July 2015-4 cycles of carbo/taxol  . Ascites     Past Surgical History  Procedure Laterality Date  . Fistula repair    . Laminectomy    . Cardiac stents    . Colonoscopy  12/18/12    Family History  Problem Relation Age of Onset  . Heart attack Mother   . Heart attack Father     Social History:  reports that he has quit smoking. He does not have any smokeless tobacco history on file. He reports that he does not drink alcohol. His drug history is not on file.  The patient is accompanied by his wife today.  Allergies: No Known Allergies  Medications Prior to Admission  Medication Sig Dispense Refill  . allopurinol (ZYLOPRIM) 300 MG tablet Take 300 mg by mouth daily.    . clopidogrel (PLAVIX) 75 MG tablet Take 75 mg  by mouth daily.    . ferrous fumarate-iron polysaccharide complex (TANDEM) 162-115.2 MG CAPS capsule Take 1 capsule by mouth daily with breakfast.    . furosemide (LASIX) 40 MG tablet Take 40 mg by mouth daily.     . Insulin Human (INSULIN PUMP) SOLN Pt uses Humalog insulin.    Marland Kitchen lactulose (CHRONULAC) 10 GM/15ML solution Take 30 g by mouth 2 (two) times daily as needed for mild constipation.    . midodrine (PROAMATINE) 5 MG tablet Take 5 mg by mouth 3 (three) times daily with meals.    . Multiple Vitamin (MULTIVITAMIN WITH MINERALS) TABS tablet Take 1 tablet by mouth daily.    . nitroGLYCERIN (NITRODUR - DOSED IN MG/24 HR) 0.4 mg/hr patch  Place 0.4 mg onto the skin daily as needed (for chest pain).     . nitroGLYCERIN (NITROSTAT) 0.4 MG SL tablet Place 0.4 mg under the tongue every 5 (five) minutes as needed for chest pain.    Marland Kitchen omeprazole (PRILOSEC) 20 MG capsule Take 20 mg by mouth daily.  3  . polyethylene glycol (MIRALAX / GLYCOLAX) packet Take 17 g by mouth daily as needed for mild constipation.    Marland Kitchen spironolactone (ALDACTONE) 100 MG tablet Take 100 mg by mouth daily.   0  . sucralfate (CARAFATE) 1 G tablet Take 1 g by mouth 2 (two) times daily before a meal.    . tamsulosin (FLOMAX) 0.4 MG CAPS capsule Take 0.4 mg by mouth daily after breakfast.       Review of Systems: GENERAL:  Feels "ok".  No fevers, sweats or weight loss. PERFORMANCE STATUS (ECOG):  1 HEENT:  No visual changes, runny nose, sore throat, mouth sores or tenderness. Lungs: No shortness of breath or cough.  No hemoptysis. Cardiac:  No chest pain, palpitations, orthopnea, or PND. GI:  Appetite up and down.  No nausea, vomiting, diarrhea, constipation, melena or hematochezia. GU:  No urgency, frequency, dysuria, or hematuria. Musculoskeletal:  Back pain.  No joint pain.  No muscle tenderness. Extremities:  No pain or swelling. Skin:  Little pressure sore on bottom (no skin breakdown).  No rashes or skin changes. Neuro:  No headache, numbness or weakness, balance or coordination issues. Endocrine:  Diabetes on insulin pump.  No thyroid issues, hot flashes or night sweats. Psych:  No mood changes, depression or anxiety. Pain:  No focal pain. Review of systems:  All other systems reviewed and found to be negative.  Physical Exam:  Blood pressure 105/49, pulse 57, temperature 97.7 F (36.5 C), temperature source Oral, resp. rate 16, height '6\' 2"'$  (1.88 m), weight 215 lb 3 oz (97.608 kg), SpO2 100 %.  GENERAL:  Well developed, well nourished, lying comfortably on the medical unit with a blue neck cushion around his head in no acute distress. MENTAL STATUS:   Alert and oriented to person, place and time. HEAD:  Pearline Cables hair.  Normocephalic, atraumatic, face symmetric, no Cushingoid features. EYES:  Blue eyes.  Pupils equal round and reactive to light and accomodation.  No conjunctivitis or scleral icterus. ENT:  Oropharynx clear without lesion.  Missing teeth.  Tongue normal. Mucous membranes moist.  RESPIRATORY:  Clear to auscultation without rales, wheezes or rhonchi. CARDIOVASCULAR:  Regular rate and rhythm without murmur, rub or gallop. ABDOMEN:  Soft, non-tender, with active bowel sounds, and no hepatosplenomegaly.  No masses.  Insulin pump. SKIN:  Ecchymosis on arms.  No rashes, ulcers or lesions. EXTREMITIES: Hyperpigmented lower extremities.  No edema, no  skin discoloration or tenderness.  No palpable cords. LYMPH NODES: No palpable cervical, supraclavicular, axillary or inguinal adenopathy  NEUROLOGICAL: Unremarkable. PSYCH:  Appropriate.  Results for orders placed or performed during the hospital encounter of 05/09/15 (from the past 48 hour(s))  Glucose, capillary     Status: Abnormal   Collection Time: 05/10/15  1:14 AM  Result Value Ref Range   Glucose-Capillary 112 (H) 65 - 99 mg/dL   Comment 1 Notify RN   CBC     Status: Abnormal   Collection Time: 05/10/15  4:38 AM  Result Value Ref Range   WBC 2.0 (L) 3.8 - 10.6 K/uL   RBC 3.19 (L) 4.40 - 5.90 MIL/uL   Hemoglobin 9.8 (L) 13.0 - 18.0 g/dL   HCT 29.1 (L) 40.0 - 52.0 %   MCV 91.4 80.0 - 100.0 fL   MCH 30.7 26.0 - 34.0 pg   MCHC 33.6 32.0 - 36.0 g/dL   RDW 20.6 (H) 11.5 - 14.5 %   Platelets 78 (L) 150 - 440 K/uL  Comprehensive metabolic panel     Status: Abnormal   Collection Time: 05/10/15  4:38 AM  Result Value Ref Range   Sodium 134 (L) 135 - 145 mmol/L   Potassium 4.0 3.5 - 5.1 mmol/L   Chloride 105 101 - 111 mmol/L   CO2 21 (L) 22 - 32 mmol/L   Glucose, Bld 63 (L) 65 - 99 mg/dL   BUN 51 (H) 6 - 20 mg/dL   Creatinine, Ser 1.76 (H) 0.61 - 1.24 mg/dL   Calcium 9.0 8.9 -  10.3 mg/dL   Total Protein 7.4 6.5 - 8.1 g/dL   Albumin 3.3 (L) 3.5 - 5.0 g/dL   AST 37 15 - 41 U/L   ALT 25 17 - 63 U/L   Alkaline Phosphatase 148 (H) 38 - 126 U/L   Total Bilirubin 0.9 0.3 - 1.2 mg/dL   GFR calc non Af Amer 38 (L) >60 mL/min   GFR calc Af Amer 44 (L) >60 mL/min    Comment: (NOTE) The eGFR has been calculated using the CKD EPI equation. This calculation has not been validated in all clinical situations. eGFR's persistently <60 mL/min signify possible Chronic Kidney Disease.    Anion gap 8 5 - 15  Protime-INR     Status: Abnormal   Collection Time: 05/10/15  4:38 AM  Result Value Ref Range   Prothrombin Time 16.3 (H) 11.4 - 15.0 seconds   INR 1.30   Glucose, capillary     Status: None   Collection Time: 05/10/15  8:27 AM  Result Value Ref Range   Glucose-Capillary 66 65 - 99 mg/dL  Glucose, capillary     Status: None   Collection Time: 05/10/15  4:40 PM  Result Value Ref Range   Glucose-Capillary 84 65 - 99 mg/dL  Glucose, capillary     Status: Abnormal   Collection Time: 05/10/15 10:22 PM  Result Value Ref Range   Glucose-Capillary 103 (H) 65 - 99 mg/dL  Glucose, capillary     Status: None   Collection Time: 05/11/15  2:25 AM  Result Value Ref Range   Glucose-Capillary 84 65 - 99 mg/dL  Basic metabolic panel     Status: Abnormal   Collection Time: 05/11/15  5:26 AM  Result Value Ref Range   Sodium 135 135 - 145 mmol/L   Potassium 3.8 3.5 - 5.1 mmol/L   Chloride 109 101 - 111 mmol/L   CO2 20 (L) 22 -  32 mmol/L   Glucose, Bld 82 65 - 99 mg/dL   BUN 41 (H) 6 - 20 mg/dL   Creatinine, Ser 1.40 (H) 0.61 - 1.24 mg/dL   Calcium 8.2 (L) 8.9 - 10.3 mg/dL   GFR calc non Af Amer 50 (L) >60 mL/min   GFR calc Af Amer 58 (L) >60 mL/min    Comment: (NOTE) The eGFR has been calculated using the CKD EPI equation. This calculation has not been validated in all clinical situations. eGFR's persistently <60 mL/min signify possible Chronic Kidney Disease.    Anion  gap 6 5 - 15  CBC     Status: Abnormal   Collection Time: 05/11/15  5:26 AM  Result Value Ref Range   WBC 1.4 (LL) 3.8 - 10.6 K/uL    Comment: WHITE COUNT CONFIRMED ON SMEAR CRITICAL RESULT CALLED TO, READ BACK BY AND VERIFIED WITH: DONNA HESLEP AT 8338 ON 05/11/15.Marland KitchenMarland KitchenLincoln    RBC 2.86 (L) 4.40 - 5.90 MIL/uL   Hemoglobin 8.7 (L) 13.0 - 18.0 g/dL   HCT 26.5 (L) 40.0 - 52.0 %   MCV 92.8 80.0 - 100.0 fL   MCH 30.4 26.0 - 34.0 pg   MCHC 32.7 32.0 - 36.0 g/dL   RDW 20.6 (H) 11.5 - 14.5 %   Platelets 71 (L) 150 - 440 K/uL  Glucose, capillary     Status: None   Collection Time: 05/11/15  7:26 AM  Result Value Ref Range   Glucose-Capillary 74 65 - 99 mg/dL  Glucose, capillary     Status: Abnormal   Collection Time: 05/11/15 11:48 AM  Result Value Ref Range   Glucose-Capillary 101 (H) 65 - 99 mg/dL  Glucose, capillary     Status: Abnormal   Collection Time: 05/11/15 10:09 PM  Result Value Ref Range   Glucose-Capillary 155 (H) 65 - 99 mg/dL   No results found.  Assessment:  The patient is a 70 y.o. gentleman with a history of limited stage small cell lung cancer s/p 4 cycles of carboplatin and etoposide and radiation.  Chest CT on 03/08/2015 revealed no evidence of disease.  He has chronic pancytopenia secondary to cirrhosis, portal hypertension, and splenomegaly.  Initial labs revealed a drop in hematocrit (11 to 9.4) with guaiac positive stools. Upper endoscopy on 05/11/2015 revealed grade 2 esophageal varices, portal hypertensive gastropathy, gastritis without hemorrhage and a normal duodenum.  White blood cell count has decreased 2700 to 1400.  Platelet count has ben stable (70,000-80,000) range.    The patient denies any new medications or herbal products.  Protonix incidence of leukopenia <= 2%.  Symptomatically, he has had 2-3 weeks of back pain.  Appetite has been up and down.   Plan:   1.  Hematology/Oncology:  Suspect leukopenia and anemia secondary to poor reserve s/p  chemotherapy and cirrhosis with portal hypertension, cirrhosis, and splenomegaly.  No new medications or herbal products.  Protonix incidence of leukopenia <= 2%.  Will check vitamin levels (B12 and folate) secondary to diet.  Follow CBC daily.  Anticipate count recovery.  If back pain does not resolve as an outpatient and low counts (below baseline) persist, patient may need a bone scan and/or marrow to rule out small cell recurrence.   Thank you for allowing me to participate in Bennet Kujawa. 's care.  I will follow him closely with you while hospitalized.  He will follow-up with Dr. Rogue Bussing after discharge in the outpatient department.   Lequita Asal, MD  05/11/2015   

## 2015-05-12 NOTE — Discharge Summary (Signed)
[facility] NAME: [name]    MR#:  [id number]  DATE OF BIRTH:  1945-02-18  DATE OF ADMISSION:  05/09/2015 ADMITTING PHYSICIAN: Henreitta Leber, MD  DATE OF DISCHARGE: 05/12/2015 PRIMARY CARE PHYSICIAN: BABAOFF, Caryl Bis, MD    ADMISSION DIAGNOSIS:  Weakness [R53.1] Falls, initial encounter [phone].XXXA] Gastrointestinal hemorrhage with melena [K92.1]   DISCHARGE DIAGNOSIS:  Upper GI bleed with melana Acute blood loss anemia esophageal varices, gastritis and portal gastropathy AKI on CKD stage 3 SECONDARY DIAGNOSIS:   Past Medical History  Diagnosis Date  . Cataracts, both eyes   . CHF (congestive heart failure) ([place])   . COPD (chronic obstructive pulmonary disease) ([place])   . Detached retina   . Diabetes ([place])   . HTN (hypertension)   . Pleural effusion   . Hyperlipidemia   . IDA (iron deficiency anemia)   . Thrombocytopenia ([place])   . Small cell lung cancer ([place])   . Splenomegaly   . Azotemia   . GI bleed   . Cirrhosis ([place])   . Nephrotic range proteinuria   . Renal insufficiency   . Small cell carcinoma of lung ([place]) 07/27/2014  . History of chemotherapy     finished July 2015-4 cycles of carbo/taxol  . Ascites     HOSPITAL COURSE:   70 year old male with past medical history of nonalcoholic steatohepatitis, liver cirrhosis, history of previous GI bleed, history of previous MI status post stents, hypertension, hyperlipidemia, chronic anemia/thrombocytopenia, diabetes who presents to the hospital after feeling increasingly weak and having a fall.  1. Upper GI bleed with melana EGD showed esophageal varices and portal gastropathy. No evidence of bleeding from esophageal varices. Pt does have gastritis.  Hold his Plavix, continue protonix BID and carafate QID. Add nadolol '20mg'$  daily to decrease portal pressures per Dr. Candace Cruise. The patient tolerated diet. However the patient blood pressure is in low side, we'll  hold nadolol. The patient need to follow-up with Dr. Jonathon Resides outpatient.  2. Acute blood loss anemia-patient's baseline hemoglobin is around 11 now down to 9 due to the upper GI bleed. hemoglobin down to 8.7. Transfuse if hemoglobin is less than 7. Hold Plavix. -Continue iron supplements, hemoglobin is 9.1 today.  3. Generalized weakness status post fall. PT evaluation suggested home health and PT.Marland Kitchen  4. Diabetes type 2 without complication. The patient wants to cotinue insulin pump. follow up Dr. Gabriel Carina in 1-2 weeks.  5. History of gout-no acute attack, continue allopurinol  6. BPH-continue Flomax.  * AKI on CKD stage 3, due to dehydration. Improved. He was treated with NS iv.  * Pancytopenia, chronic.  * worsening leukopenia, better, f/u CBC and hematology as outpatient. History of CHF, unknown type. Stable. Hold Lasix and spironolactone due to low blood pressure.  DISCHARGE CONDITIONS:   Stable, discharge to home with home health and PT today.  CONSULTS OBTAINED:  Treatment Team:  Lucilla Lame, MD Judi Cong, MD  DRUG ALLERGIES:  No Known Allergies  DISCHARGE MEDICATIONS:   Current Discharge Medication List    START taking these medications   Details  pantoprazole (PROTONIX) 40 MG tablet Take 1 tablet (40 mg total) by mouth 2 (two) times daily before a meal. Qty: 60 tablet, Refills: 0      CONTINUE these medications which have CHANGED   Details  sucralfate (CARAFATE) 1 g tablet Take 1 tablet (1 g total) by mouth 4 (four) times daily. Qty: 120 tablet, Refills:  0      CONTINUE these medications which have NOT CHANGED   Details  allopurinol (ZYLOPRIM) 300 MG tablet Take 300 mg by mouth daily.    ferrous fumarate-iron polysaccharide complex (TANDEM) 162-115.2 MG CAPS capsule Take 1 capsule by mouth daily with breakfast.    Insulin Human (INSULIN PUMP) SOLN Pt uses Humalog insulin.    lactulose (CHRONULAC) 10 GM/15ML solution Take 30 g by mouth 2 (two) times  daily as needed for mild constipation.    midodrine (PROAMATINE) 5 MG tablet Take 5 mg by mouth 3 (three) times daily with meals.    Multiple Vitamin (MULTIVITAMIN WITH MINERALS) TABS tablet Take 1 tablet by mouth daily.    nitroGLYCERIN (NITRODUR - DOSED IN MG/24 HR) 0.4 mg/hr patch Place 0.4 mg onto the skin daily as needed (for chest pain).     nitroGLYCERIN (NITROSTAT) 0.4 MG SL tablet Place 0.4 mg under the tongue every 5 (five) minutes as needed for chest pain.    polyethylene glycol (MIRALAX / GLYCOLAX) packet Take 17 g by mouth daily as needed for mild constipation.    tamsulosin (FLOMAX) 0.4 MG CAPS capsule Take 0.4 mg by mouth daily after breakfast.       STOP taking these medications     clopidogrel (PLAVIX) 75 MG tablet      furosemide (LASIX) 40 MG tablet      omeprazole (PRILOSEC) 20 MG capsule      spironolactone (ALDACTONE) 100 MG tablet          DISCHARGE INSTRUCTIONS:    If you experience worsening of your admission symptoms, develop shortness of breath, life threatening emergency, suicidal or homicidal thoughts you must seek medical attention immediately by calling 911 or calling your MD immediately  if symptoms less severe.  You Must read complete instructions/literature along with all the possible adverse reactions/side effects for all the Medicines you take and that have been prescribed to you. Take any new Medicines after you have completely understood and accept all the possible adverse reactions/side effects.   Please note  You were cared for by a hospitalist during your hospital stay. If you have any questions about your discharge medications or the care you received while you were in the hospital after you are discharged, you can call the unit and asked to speak with the hospitalist on call if the hospitalist that took care of you is not available. Once you are discharged, your primary care physician will handle any further medical issues. Please note  that NO REFILLS for any discharge medications will be authorized once you are discharged, as it is imperative that you return to your primary care physician (or establish a relationship with a primary care physician if you do not have one) for your aftercare needs so that they can reassess your need for medications and monitor your lab values.    Today   SUBJECTIVE   No complaint.   VITAL SIGNS:  Blood pressure 107/46, pulse 50, temperature 97.8 F (36.6 C), temperature source Oral, resp. rate 20, height '6\' 2"'$  (1.88 m), weight 97.608 kg (215 lb 3 oz), SpO2 100 %.  I/O:   Intake/Output Summary (Last 24 hours) at 05/12/15 1345 Last data filed at 05/12/15 0800  Gross per 24 hour  Intake   1520 ml  Output      0 ml  Net   1520 ml    PHYSICAL EXAMINATION:  GENERAL:  70 y.o.-year-old patient lying in the bed with no acute  distress.  EYES: Pupils equal, round, reactive to light and accommodation. No scleral icterus. Extraocular muscles intact.  HEENT: Head atraumatic, normocephalic. Oropharynx and nasopharynx clear.  NECK:  Supple, no jugular venous distention. No thyroid enlargement, no tenderness.  LUNGS: Normal breath sounds bilaterally, no wheezing, rales,rhonchi or crepitation. No use of accessory muscles of respiration.  CARDIOVASCULAR: S1, S2 normal. No murmurs, rubs, or gallops.  ABDOMEN: Soft, non-tender, non-distended. Bowel sounds present. No organomegaly or mass.  EXTREMITIES: No pedal edema, cyanosis, or clubbing.  NEUROLOGIC: Cranial nerves II through XII are intact. Muscle strength 5/5 in all extremities. Sensation intact. Gait not checked.  PSYCHIATRIC: The patient is alert and oriented x 3.  SKIN: No obvious rash, lesion, or ulcer.   DATA REVIEW:   CBC  Recent Labs Lab 05/12/15 0618  WBC 1.7*  HGB 9.1*  HCT 28.1*  PLT 76*    Chemistries   Recent Labs Lab 05/10/15 0438  05/12/15 0618  NA 134*  < > 132*  K 4.0  < > 4.0  CL 105  < > 106  CO2 21*  < >  21*  GLUCOSE 63*  < > 82  BUN 51*  < > 35*  CREATININE 1.76*  < > 1.34*  CALCIUM 9.0  < > 8.4*  AST 37  --   --   ALT 25  --   --   ALKPHOS 148*  --   --   BILITOT 0.9  --   --   < > = values in this interval not displayed.  Cardiac Enzymes  Recent Labs Lab 05/09/15 1257  TROPONINI <0.03    Microbiology Results  Results for orders placed or performed in visit on 12/07/13  Body fluid culture     Status: None   Collection Time: 12/07/13  1:05 PM  Result Value Ref Range Status   Micro Text Report   Final       SOURCE: ABDOMINAL FLUID    COMMENT                   NO GROWTH AEROBICALLY/ANAEROBICALLY IN 4 DAYS   GRAM STAIN                NO WHITE BLOOD CELLS   GRAM STAIN                RARE RED BLOOD CELLS   GRAM STAIN                NO ORGANISMS SEEN   ANTIBIOTIC                                                        RADIOLOGY:  No results found.      Management plans discussed with the patient, family and they are in agreement.  CODE STATUS:     Code Status Orders        Start     Ordered   05/09/15 1902  Full code   Continuous     05/09/15 1901    Code Status History    Date Active Date Inactive Code Status Order ID Comments User Context   This patient has a current code status but no historical code status.    Advance Directive Documentation        Most Recent  Value   Type of Advance Directive  Living will, Advance instruction for mental health treatment   Pre-existing out of facility DNR order (yellow form or pink MOST form)     "MOST" Form in Place?        TOTAL TIME TAKING CARE OF THIS PATIENT: 35 minutes.    Demetrios Loll M.D on 05/12/2015 at 1:45 PM  Between 7am to 6pm - Pager - [phone]  After 6pm go to [email or web address] - password [email or web address]  Office  [phone]  CC: Primary care physician; BABAOFF, Caryl Bis, MD

## 2015-05-14 ENCOUNTER — Encounter: Payer: Self-pay | Admitting: Gastroenterology

## 2015-05-15 LAB — GLUCOSE, CAPILLARY: Glucose-Capillary: 142 mg/dL — ABNORMAL HIGH (ref 65–99)

## 2015-05-18 ENCOUNTER — Other Ambulatory Visit: Payer: Self-pay | Admitting: *Deleted

## 2015-05-18 DIAGNOSIS — D509 Iron deficiency anemia, unspecified: Secondary | ICD-10-CM

## 2015-05-18 MED ORDER — FERROUS FUM-IRON POLYSACCH 162-115.2 MG PO CAPS: 1.0000 | ORAL_CAPSULE | Freq: Every day | ORAL | Status: DC

## 2015-05-18 NOTE — Progress Notes (Signed)
Received fax from patient's pharmacy, requesting RF on [employer].  RX escribed to [facility].

## 2015-05-21 ENCOUNTER — Inpatient Hospital Stay: Payer: Medicare Other | Attending: Internal Medicine | Admitting: Internal Medicine

## 2015-05-21 ENCOUNTER — Inpatient Hospital Stay: Payer: Medicare Other

## 2015-05-21 VITALS — BP 103/57 | HR 72 | Temp 96.7°F | Resp 18 | Wt 220.5 lb

## 2015-05-21 DIAGNOSIS — R609 Edema, unspecified: Secondary | ICD-10-CM | POA: Diagnosis not present

## 2015-05-21 DIAGNOSIS — D509 Iron deficiency anemia, unspecified: Secondary | ICD-10-CM | POA: Diagnosis not present

## 2015-05-21 DIAGNOSIS — E785 Hyperlipidemia, unspecified: Secondary | ICD-10-CM | POA: Insufficient documentation

## 2015-05-21 DIAGNOSIS — R630 Anorexia: Secondary | ICD-10-CM | POA: Diagnosis not present

## 2015-05-21 DIAGNOSIS — M545 Low back pain, unspecified: Secondary | ICD-10-CM

## 2015-05-21 DIAGNOSIS — N289 Disorder of kidney and ureter, unspecified: Secondary | ICD-10-CM | POA: Insufficient documentation

## 2015-05-21 DIAGNOSIS — D61818 Other pancytopenia: Secondary | ICD-10-CM | POA: Insufficient documentation

## 2015-05-21 DIAGNOSIS — K7460 Unspecified cirrhosis of liver: Secondary | ICD-10-CM | POA: Insufficient documentation

## 2015-05-21 DIAGNOSIS — C3412 Malignant neoplasm of upper lobe, left bronchus or lung: Secondary | ICD-10-CM | POA: Insufficient documentation

## 2015-05-21 DIAGNOSIS — R188 Other ascites: Secondary | ICD-10-CM | POA: Insufficient documentation

## 2015-05-21 DIAGNOSIS — I509 Heart failure, unspecified: Secondary | ICD-10-CM

## 2015-05-21 DIAGNOSIS — K7581 Nonalcoholic steatohepatitis (NASH): Secondary | ICD-10-CM | POA: Insufficient documentation

## 2015-05-21 DIAGNOSIS — Z923 Personal history of irradiation: Secondary | ICD-10-CM | POA: Insufficient documentation

## 2015-05-21 DIAGNOSIS — R0602 Shortness of breath: Secondary | ICD-10-CM | POA: Diagnosis not present

## 2015-05-21 DIAGNOSIS — Z9221 Personal history of antineoplastic chemotherapy: Secondary | ICD-10-CM | POA: Diagnosis not present

## 2015-05-21 DIAGNOSIS — M25552 Pain in left hip: Secondary | ICD-10-CM

## 2015-05-21 DIAGNOSIS — M25551 Pain in right hip: Secondary | ICD-10-CM | POA: Insufficient documentation

## 2015-05-21 DIAGNOSIS — J449 Chronic obstructive pulmonary disease, unspecified: Secondary | ICD-10-CM | POA: Diagnosis not present

## 2015-05-21 DIAGNOSIS — E119 Type 2 diabetes mellitus without complications: Secondary | ICD-10-CM | POA: Insufficient documentation

## 2015-05-21 DIAGNOSIS — Z79899 Other long term (current) drug therapy: Secondary | ICD-10-CM | POA: Diagnosis not present

## 2015-05-21 DIAGNOSIS — I10 Essential (primary) hypertension: Secondary | ICD-10-CM | POA: Insufficient documentation

## 2015-05-21 DIAGNOSIS — Z87891 Personal history of nicotine dependence: Secondary | ICD-10-CM

## 2015-05-21 DIAGNOSIS — C3492 Malignant neoplasm of unspecified part of left bronchus or lung: Secondary | ICD-10-CM

## 2015-05-21 DIAGNOSIS — J90 Pleural effusion, not elsewhere classified: Secondary | ICD-10-CM | POA: Insufficient documentation

## 2015-05-21 DIAGNOSIS — Z794 Long term (current) use of insulin: Secondary | ICD-10-CM | POA: Insufficient documentation

## 2015-05-21 LAB — COMPREHENSIVE METABOLIC PANEL
ALK PHOS: 123 U/L (ref 38–126)
ALT: 24 U/L (ref 17–63)
ANION GAP: 7 (ref 5–15)
AST: 43 U/L — ABNORMAL HIGH (ref 15–41)
Albumin: 3.1 g/dL — ABNORMAL LOW (ref 3.5–5.0)
BILIRUBIN TOTAL: 0.6 mg/dL (ref 0.3–1.2)
BUN: 33 mg/dL — ABNORMAL HIGH (ref 6–20)
CALCIUM: 8.9 mg/dL (ref 8.9–10.3)
CO2: 19 mmol/L — AB (ref 22–32)
Chloride: 104 mmol/L (ref 101–111)
Creatinine, Ser: 1.44 mg/dL — ABNORMAL HIGH (ref 0.61–1.24)
GFR calc non Af Amer: 48 mL/min — ABNORMAL LOW (ref >60)
GFR, EST AFRICAN AMERICAN: 56 mL/min — AB (ref >60)
Glucose, Bld: 190 mg/dL — ABNORMAL HIGH (ref 65–99)
Potassium: 5.2 mmol/L — ABNORMAL HIGH (ref 3.5–5.1)
SODIUM: 130 mmol/L — AB (ref 135–145)
TOTAL PROTEIN: 7.2 g/dL (ref 6.5–8.1)

## 2015-05-21 LAB — CBC WITH DIFFERENTIAL/PLATELET
Basophils Absolute: 0 10*3/uL (ref 0–0.1)
Basophils Relative: 1 %
EOS ABS: 0.1 10*3/uL (ref 0–0.7)
Eosinophils Relative: 3 %
HCT: 29.6 % — ABNORMAL LOW (ref 40.0–52.0)
HEMOGLOBIN: 9.7 g/dL — AB (ref 13.0–18.0)
LYMPHS ABS: 0.2 10*3/uL — AB (ref 1.0–3.6)
Lymphocytes Relative: 8 %
MCH: 29.8 pg (ref 26.0–34.0)
MCHC: 32.7 g/dL (ref 32.0–36.0)
MCV: 91.1 fL (ref 80.0–100.0)
MONO ABS: 0.4 10*3/uL (ref 0.2–1.0)
MONOS PCT: 13 %
NEUTROS PCT: 75 %
Neutro Abs: 2.1 10*3/uL (ref 1.4–6.5)
Platelets: 89 10*3/uL — ABNORMAL LOW (ref 150–440)
RBC: 3.25 MIL/uL — ABNORMAL LOW (ref 4.40–5.90)
RDW: 20 % — AB (ref 11.5–14.5)
WBC: 2.8 10*3/uL — ABNORMAL LOW (ref 3.8–10.6)

## 2015-05-21 LAB — SAMPLE TO BLOOD BANK

## 2015-05-21 NOTE — Progress Notes (Signed)
Patient here today as hospital follow up.  Patient accompanied by wife today who is concerned about patients WBC.  States it was 1.7 on 05-13-15.  Hgb was 9 at d/c.

## 2015-05-21 NOTE — Progress Notes (Signed)
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  Patient was seen in the hospital recently for upper GI bleed with melena; EGD did not show any obvious ulcer bleeding. Patient was discontinued off his Plavix. The time of discharge hemoglobin was 9 up from 7 at admission.  Patient is here for follow-up. Patient is a poor appetite. He continues to have shortness of breath with exertion.  He continues to have leg swelling. Overall feels poorly. Denies any blood in his stools or tarry stools. He notes to have black stools/ on by mouth iron.  He does complain of difficulty sleeping at nighttime. Otherwise denies any headaches or vision changes. His activity is fairly limited at home.  REVIEW OF SYSTEMS:  A complete 10 point review of system is done which is negative except mentioned above/history of present illness.   PAST MEDICAL HISTORY :  Past Medical History  Diagnosis Date  . Cataracts, both eyes   . CHF (congestive heart failure) ([place])   . COPD (chronic obstructive pulmonary disease) ([place])   . Detached retina   . Diabetes ([place])   . HTN (hypertension)   . Pleural effusion   . Hyperlipidemia   . IDA (iron deficiency anemia)   . Thrombocytopenia ([place])   . Small cell lung cancer ([place])   . Splenomegaly   . Azotemia   . GI bleed   . Cirrhosis ([place])   . Nephrotic range proteinuria   . Renal  insufficiency   . Small cell carcinoma of lung ([place]) 07/27/2014  . History of chemotherapy     finished July 2015-4 cycles of carbo/taxol  . Ascites     PAST SURGICAL HISTORY :   Past Surgical History  Procedure Laterality Date  . Fistula repair    . Laminectomy    . Cardiac stents    . Colonoscopy  12/18/12  . Esophagogastroduodenoscopy (egd) with propofol N/A 05/11/2015    Procedure: ESOPHAGOGASTRODUODENOSCOPY (EGD) WITH PROPOFOL;  Surgeon: Hulen Luster, MD;  Location: [facility] ENDOSCOPY;  Service: Endoscopy;  Laterality: N/A;    FAMILY HISTORY :   Family History  Problem Relation Age of Onset  . Heart attack Mother   . Heart attack Father     SOCIAL HISTORY:   Social History  Substance Use Topics  . Smoking status: Former Research scientist (life sciences)  . Smokeless tobacco: Not on file  . Alcohol Use: No    ALLERGIES:  has No Known Allergies.  MEDICATIONS:  Current Outpatient Prescriptions  Medication Sig Dispense Refill  . allopurinol (ZYLOPRIM) 300 MG tablet Take 300 mg by mouth daily.    . ferrous fumarate-iron polysaccharide complex (TANDEM) 162-115.2 MG CAPS capsule Take 1 capsule by mouth daily with breakfast. 30 capsule 12  . Insulin Human (INSULIN PUMP) SOLN Pt uses Humalog insulin.    Marland Kitchen lactulose (CHRONULAC) 10 GM/15ML solution Take 30 g by mouth 2 (two) times daily as needed for mild constipation.    . midodrine (PROAMATINE) 5 MG  tablet Take 5 mg by mouth 3 (three) times daily with meals.    . Multiple Vitamin (MULTIVITAMIN WITH MINERALS) TABS tablet Take 1 tablet by mouth daily.    . nitroGLYCERIN (NITRODUR - DOSED IN MG/24 HR) 0.4 mg/hr patch Place 0.4 mg onto the skin daily as needed (for chest pain).     . nitroGLYCERIN (NITROSTAT) 0.4 MG SL tablet Place 0.4 mg under the tongue every 5 (five) minutes as needed for chest pain.    . pantoprazole (PROTONIX) 40 MG tablet Take 1 tablet (40 mg total) by mouth 2 (two) times daily before a meal. 60 tablet 0  . polyethylene glycol (MIRALAX /  GLYCOLAX) packet Take 17 g by mouth daily as needed for mild constipation.    . sucralfate (CARAFATE) 1 g tablet Take 1 tablet (1 g total) by mouth 4 (four) times daily. 120 tablet 0  . tamsulosin (FLOMAX) 0.4 MG CAPS capsule Take 0.4 mg by mouth daily after breakfast.      No current facility-administered medications for this visit.   Facility-Administered Medications Ordered in Other Visits  Medication Dose Route Frequency Provider Last Rate Last Dose  . sodium chloride 0.9 % injection 10 mL  10 mL Intravenous PRN Evlyn Kanner, NP   10 mL at 07/14/14 1125    PHYSICAL EXAMINATION: ECOG PERFORMANCE STATUS: 2 - Symptomatic, <50% confined to bed  BP 103/57 mmHg  Pulse 72  Temp(Src) 96.7 F (35.9 C) (Tympanic)  Resp 18  Wt 220 lb 7.4 oz (100 kg)  Filed Weights   05/21/15 1113  Weight: 220 lb 7.4 oz (100 kg)    GENERAL: Well-nourished well-developed; Alert, no distress and comfortable.  Accompanied by wife/ His wheelchair. Appears sick. EYES: Positive for pallor. Positive for mild icterus. OROPHARYNX: no thrush or ulceration; poor dentition  NECK: supple, no masses felt LYMPH:  no palpable lymphadenopathy in the cervical, axillary or inguinal regions LUNGS: Decreased breath sounds at the bases. No wheeze or crackles HEART/CVS: regular rate & rhythm and no murmurs; 2+ bilateral lower extremity edema ABDOMEN:abdomen soft, non-tender and normal bowel sounds; positive for splenomegaly. Musculoskeletal:no cyanosis of digits and no clubbing  PSYCH: alert & oriented x 3 with fluent speech NEURO: no focal motor/sensory deficits SKIN:  Multiple ecchymosis noted on the extremities/ chronic.  LABORATORY DATA:  I have reviewed the data as listed    Component Value Date/Time   NA 132* 05/12/2015 0618   NA 126* 05/03/2014 1112   K 4.0 05/12/2015 0618   K 4.8 05/03/2014 1112   CL 106 05/12/2015 0618   CL 97* 05/03/2014 1112   CO2 21* 05/12/2015 0618   CO2 22 05/03/2014 1112    GLUCOSE 82 05/12/2015 0618   GLUCOSE 93 05/03/2014 1112   BUN 35* 05/12/2015 0618   BUN 30* 05/03/2014 1112   CREATININE 1.34* 05/12/2015 0618   CREATININE 1.55* 05/03/2014 1112   CALCIUM 8.4* 05/12/2015 0618   CALCIUM 8.8* 05/03/2014 1112   PROT 7.4 05/10/2015 0438   PROT 7.2 05/03/2014 1112   ALBUMIN 3.3* 05/10/2015 0438   ALBUMIN 3.5 05/03/2014 1112   AST 37 05/10/2015 0438   AST 27 05/03/2014 1112   ALT 25 05/10/2015 0438   ALT 14* 05/03/2014 1112   ALKPHOS 148* 05/10/2015 0438   ALKPHOS 105 05/03/2014 1112   BILITOT 0.9 05/10/2015 0438   BILITOT 0.9 05/03/2014 1112   GFRNONAA 53* 05/12/2015 0618   GFRNONAA 46* 05/03/2014 1112   GFRNONAA 33* 03/06/2014 3559  GFRAA >60 05/12/2015 0618   GFRAA 53* 05/03/2014 1112   GFRAA 40* 03/06/2014 0959    No results found for: SPEP, UPEP  Lab Results  Component Value Date   WBC 1.7* 05/12/2015   NEUTROABS 2.0 03/12/2015   HGB 9.1* 05/12/2015   HCT 28.1* 05/12/2015   MCV 93.0 05/12/2015   PLT 76* 05/12/2015      Chemistry      Component Value Date/Time   NA 132* 05/12/2015 0618   NA 126* 05/03/2014 1112   K 4.0 05/12/2015 0618   K 4.8 05/03/2014 1112   CL 106 05/12/2015 0618   CL 97* 05/03/2014 1112   CO2 21* 05/12/2015 0618   CO2 22 05/03/2014 1112   BUN 35* 05/12/2015 0618   BUN 30* 05/03/2014 1112   CREATININE 1.34* 05/12/2015 0618   CREATININE 1.55* 05/03/2014 1112      Component Value Date/Time   CALCIUM 8.4* 05/12/2015 0618   CALCIUM 8.8* 05/03/2014 1112   ALKPHOS 148* 05/10/2015 0438   ALKPHOS 105 05/03/2014 1112   AST 37 05/10/2015 0438   AST 27 05/03/2014 1112   ALT 25 05/10/2015 0438   ALT 14* 05/03/2014 1112   BILITOT 0.9 05/10/2015 0438   BILITOT 0.9 05/03/2014 1112        ASSESSMENT & PLAN:   # Limited stage small cell lung cancer status post chemoradiation. Given the worsening shortness of breath recommend- repeat CT scan of the chest without contrast.  # Bilateral hip pain- recommend  bone scan for further evaluation.  # Pancytopenia- cirrhosis/splenomegaly. Await labs from today. We'll inform patient/send copy of the blood work patient's home.   # History of anemia likely iron deficiency/liver disease. History of recent GI bleed.  # Patient follow-up with me in approximately 2 weeks to review the results of the workup. Overall prognosis appears poor.  # 25 minutes face-to-face with the patient discussing the above plan of care; more than 50% of time spent on prognosis/ natural history; counseling and coordination.     Cammie Sickle, MD 05/21/2015 11:28 AM

## 2015-05-22 ENCOUNTER — Telehealth: Payer: Self-pay | Admitting: *Deleted

## 2015-05-22 NOTE — Telephone Encounter (Signed)
I left a msg on pt's home phone asking pt to contact cancer center. I was able to reach pt on his cell phone. Pt informed of his lab values. Pt made aware that his Cbc is stable, potassium and creat. Level slightly elevated. We will wait on ct & bone scan results. I will personally route his lab values to Dr. Baldemar Lenis for pmd review.

## 2015-05-22 NOTE — Telephone Encounter (Signed)
-----   Message from Cammie Sickle, MD sent at 05/22/2015  1:28 PM EDT ----- Please inform patient that his CBC is stable; potassium is slightly up/ creatinine-slightly up but stable. No new recommendations from my standpoint. We will await CAT scan ; bone scan is ordered. Please fax copy of his labs to his PCP's office. We will check labs again at next visit- Thx

## 2015-05-25 ENCOUNTER — Ambulatory Visit
Admission: RE | Admit: 2015-05-25 | Discharge: 2015-05-25 | Disposition: A | Discharge status: Home / Self Care | Payer: Medicare Other | Source: Ambulatory Visit | Attending: Internal Medicine | Admitting: Internal Medicine

## 2015-05-25 ENCOUNTER — Encounter
Admission: RE | Admit: 2015-05-25 | Discharge: 2015-05-25 | Disposition: A | Discharge status: Home / Self Care | Payer: Medicare Other | Source: Ambulatory Visit | Attending: Internal Medicine | Admitting: Internal Medicine

## 2015-05-25 DIAGNOSIS — C3492 Malignant neoplasm of unspecified part of left bronchus or lung: Secondary | ICD-10-CM | POA: Diagnosis present

## 2015-05-25 DIAGNOSIS — M25551 Pain in right hip: Secondary | ICD-10-CM | POA: Diagnosis not present

## 2015-05-25 DIAGNOSIS — M25552 Pain in left hip: Secondary | ICD-10-CM | POA: Diagnosis not present

## 2015-05-25 MED ORDER — TECHNETIUM TC 99M MEDRONATE IV KIT
25.0000 | PACK | Freq: Once | INTRAVENOUS | Status: AC | PRN
  Administered 2015-05-25: 24.13 via INTRAVENOUS

## 2015-05-28 ENCOUNTER — Ambulatory Visit
Admission: RE | Admit: 2015-05-28 | Discharge: 2015-05-28 | Disposition: A | Discharge status: Home / Self Care | Payer: Medicare Other | Source: Ambulatory Visit | Attending: Internal Medicine | Admitting: Internal Medicine

## 2015-05-28 ENCOUNTER — Ambulatory Visit: Payer: Medicare Other

## 2015-05-28 DIAGNOSIS — R188 Other ascites: Secondary | ICD-10-CM | POA: Diagnosis not present

## 2015-05-28 DIAGNOSIS — I2510 Atherosclerotic heart disease of native coronary artery without angina pectoris: Secondary | ICD-10-CM | POA: Insufficient documentation

## 2015-05-28 DIAGNOSIS — K7460 Unspecified cirrhosis of liver: Secondary | ICD-10-CM | POA: Diagnosis not present

## 2015-05-28 DIAGNOSIS — C3492 Malignant neoplasm of unspecified part of left bronchus or lung: Secondary | ICD-10-CM | POA: Insufficient documentation

## 2015-05-28 DIAGNOSIS — Z9889 Other specified postprocedural states: Secondary | ICD-10-CM | POA: Diagnosis not present

## 2015-06-04 ENCOUNTER — Inpatient Hospital Stay (HOSPITAL_BASED_OUTPATIENT_CLINIC_OR_DEPARTMENT_OTHER): Payer: Medicare Other | Admitting: Internal Medicine

## 2015-06-04 ENCOUNTER — Inpatient Hospital Stay: Payer: Medicare Other | Attending: Internal Medicine

## 2015-06-04 ENCOUNTER — Inpatient Hospital Stay: Payer: Medicare Other

## 2015-06-04 VITALS — BP 113/61 | HR 81 | Temp 97.0°F | Resp 18 | Wt 214.7 lb

## 2015-06-04 DIAGNOSIS — R630 Anorexia: Secondary | ICD-10-CM

## 2015-06-04 DIAGNOSIS — K7581 Nonalcoholic steatohepatitis (NASH): Secondary | ICD-10-CM

## 2015-06-04 DIAGNOSIS — M25552 Pain in left hip: Secondary | ICD-10-CM | POA: Diagnosis not present

## 2015-06-04 DIAGNOSIS — Z85118 Personal history of other malignant neoplasm of bronchus and lung: Secondary | ICD-10-CM | POA: Insufficient documentation

## 2015-06-04 DIAGNOSIS — M25551 Pain in right hip: Secondary | ICD-10-CM | POA: Diagnosis not present

## 2015-06-04 DIAGNOSIS — Z794 Long term (current) use of insulin: Secondary | ICD-10-CM | POA: Insufficient documentation

## 2015-06-04 DIAGNOSIS — J90 Pleural effusion, not elsewhere classified: Secondary | ICD-10-CM | POA: Insufficient documentation

## 2015-06-04 DIAGNOSIS — R188 Other ascites: Secondary | ICD-10-CM | POA: Diagnosis not present

## 2015-06-04 DIAGNOSIS — Z8719 Personal history of other diseases of the digestive system: Secondary | ICD-10-CM

## 2015-06-04 DIAGNOSIS — L89311 Pressure ulcer of right buttock, stage 1: Secondary | ICD-10-CM | POA: Insufficient documentation

## 2015-06-04 DIAGNOSIS — I959 Hypotension, unspecified: Secondary | ICD-10-CM

## 2015-06-04 DIAGNOSIS — D61818 Other pancytopenia: Secondary | ICD-10-CM

## 2015-06-04 DIAGNOSIS — N189 Chronic kidney disease, unspecified: Secondary | ICD-10-CM

## 2015-06-04 DIAGNOSIS — Z8673 Personal history of transient ischemic attack (TIA), and cerebral infarction without residual deficits: Secondary | ICD-10-CM

## 2015-06-04 DIAGNOSIS — R7989 Other specified abnormal findings of blood chemistry: Secondary | ICD-10-CM | POA: Diagnosis not present

## 2015-06-04 DIAGNOSIS — R161 Splenomegaly, not elsewhere classified: Secondary | ICD-10-CM

## 2015-06-04 DIAGNOSIS — C3492 Malignant neoplasm of unspecified part of left bronchus or lung: Secondary | ICD-10-CM

## 2015-06-04 DIAGNOSIS — I129 Hypertensive chronic kidney disease with stage 1 through stage 4 chronic kidney disease, or unspecified chronic kidney disease: Secondary | ICD-10-CM | POA: Insufficient documentation

## 2015-06-04 DIAGNOSIS — I509 Heart failure, unspecified: Secondary | ICD-10-CM | POA: Diagnosis not present

## 2015-06-04 DIAGNOSIS — Z9221 Personal history of antineoplastic chemotherapy: Secondary | ICD-10-CM | POA: Diagnosis not present

## 2015-06-04 DIAGNOSIS — K7460 Unspecified cirrhosis of liver: Secondary | ICD-10-CM | POA: Insufficient documentation

## 2015-06-04 DIAGNOSIS — G4700 Insomnia, unspecified: Secondary | ICD-10-CM | POA: Diagnosis not present

## 2015-06-04 DIAGNOSIS — D509 Iron deficiency anemia, unspecified: Secondary | ICD-10-CM | POA: Insufficient documentation

## 2015-06-04 DIAGNOSIS — E119 Type 2 diabetes mellitus without complications: Secondary | ICD-10-CM

## 2015-06-04 DIAGNOSIS — E785 Hyperlipidemia, unspecified: Secondary | ICD-10-CM

## 2015-06-04 DIAGNOSIS — Z923 Personal history of irradiation: Secondary | ICD-10-CM | POA: Diagnosis not present

## 2015-06-04 DIAGNOSIS — J449 Chronic obstructive pulmonary disease, unspecified: Secondary | ICD-10-CM

## 2015-06-04 DIAGNOSIS — Z87891 Personal history of nicotine dependence: Secondary | ICD-10-CM | POA: Diagnosis not present

## 2015-06-04 DIAGNOSIS — R0602 Shortness of breath: Secondary | ICD-10-CM | POA: Diagnosis not present

## 2015-06-04 LAB — COMPREHENSIVE METABOLIC PANEL
ALBUMIN: 3.2 g/dL — AB (ref 3.5–5.0)
ALT: 25 U/L (ref 17–63)
ANION GAP: 10 (ref 5–15)
AST: 37 U/L (ref 15–41)
Alkaline Phosphatase: 141 U/L — ABNORMAL HIGH (ref 38–126)
BUN: 39 mg/dL — ABNORMAL HIGH (ref 6–20)
CO2: 18 mmol/L — AB (ref 22–32)
Calcium: 8.9 mg/dL (ref 8.9–10.3)
Chloride: 103 mmol/L (ref 101–111)
Creatinine, Ser: 1.46 mg/dL — ABNORMAL HIGH (ref 0.61–1.24)
GFR calc non Af Amer: 48 mL/min — ABNORMAL LOW (ref >60)
GFR, EST AFRICAN AMERICAN: 55 mL/min — AB (ref >60)
GLUCOSE: 181 mg/dL — AB (ref 65–99)
POTASSIUM: 5.1 mmol/L (ref 3.5–5.1)
SODIUM: 131 mmol/L — AB (ref 135–145)
Total Bilirubin: 0.8 mg/dL (ref 0.3–1.2)
Total Protein: 7.5 g/dL (ref 6.5–8.1)

## 2015-06-04 LAB — CBC WITH DIFFERENTIAL/PLATELET
BASOS ABS: 0 10*3/uL (ref 0–0.1)
BASOS PCT: 1 %
Eosinophils Absolute: 0 10*3/uL (ref 0–0.7)
Eosinophils Relative: 1 %
HEMATOCRIT: 29.9 % — AB (ref 40.0–52.0)
HEMOGLOBIN: 9.9 g/dL — AB (ref 13.0–18.0)
LYMPHS PCT: 6 %
Lymphs Abs: 0.2 10*3/uL — ABNORMAL LOW (ref 1.0–3.6)
MCH: 29.3 pg (ref 26.0–34.0)
MCHC: 33 g/dL (ref 32.0–36.0)
MCV: 88.9 fL (ref 80.0–100.0)
MONO ABS: 0.3 10*3/uL (ref 0.2–1.0)
MONOS PCT: 11 %
NEUTROS ABS: 2.5 10*3/uL (ref 1.4–6.5)
NEUTROS PCT: 81 %
Platelets: 108 10*3/uL — ABNORMAL LOW (ref 150–440)
RBC: 3.36 MIL/uL — ABNORMAL LOW (ref 4.40–5.90)
RDW: 19.1 % — AB (ref 11.5–14.5)
WBC: 3 10*3/uL — ABNORMAL LOW (ref 3.8–10.6)

## 2015-06-04 NOTE — Progress Notes (Signed)
Patient states tailbone is sore from sitting.  Patient here for bone scan and CT results.

## 2015-06-04 NOTE — Progress Notes (Signed)
[place] OFFICE PROGRESS NOTE  Patient Care Team: Derinda Late, MD as PCP - General (Family Medicine)   SUMMARY OF ONCOLOGIC HISTORY:  # July 2015- SMALL CELL LUL [limited stage] s/p RT-Carbo-Etop x 4; JAN 2017- CT-Stable LUL mass/scar; May 2017- STABLE radiation changes; bone scan-NEG  # DEC 2016- Right LLL [~1.6cm]- likely nodular atelectasis; MAY 2017- CT- RLL-rounded atelectasis/small effusion  # PCI Brain [Dr.Crystal; 2015]  # Pancytopenia/ Cirrhosis-ascites/Spleenomegaly- NASH [Dr.Skulskie]  # Hypotension [Dr.Singh; Midodrine];   INTERVAL HISTORY:  70 year old male patient with above history of limited stage small cell lung cancer;also 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  Patient continues to feel poorly; continues to feel short of breath. He is resting most of the time. Poor appetite. Losing muscle mass. He does complain of difficulty sleeping at nighttime. Otherwise denies any headaches or vision changes. His activity is fairly limited at home.  Also complains of a rash on his lower buttock.   REVIEW OF SYSTEMS:  A complete 10 point review of system is done which is negative except mentioned above/history of present illness.   PAST MEDICAL HISTORY :  Past Medical History  Diagnosis Date  . Cataracts, both eyes   . CHF (congestive heart failure) ([place])   . COPD (chronic obstructive pulmonary disease) ([place])   . Detached retina   . Diabetes ([place])   . HTN (hypertension)   . Pleural effusion   . Hyperlipidemia   . IDA (iron deficiency anemia)   . Thrombocytopenia ([place])   . Small cell lung cancer ([place])   . Splenomegaly   . Azotemia   . GI bleed   . Cirrhosis ([place])   . Nephrotic range proteinuria   . Renal insufficiency   . Small cell carcinoma of lung ([place]) 07/27/2014  . History of chemotherapy     finished July 2015-4 cycles of carbo/taxol  . Ascites     PAST  SURGICAL HISTORY :   Past Surgical History  Procedure Laterality Date  . Fistula repair    . Laminectomy    . Cardiac stents    . Colonoscopy  12/18/12  . Esophagogastroduodenoscopy (egd) with propofol N/A 05/11/2015    Procedure: ESOPHAGOGASTRODUODENOSCOPY (EGD) WITH PROPOFOL;  Surgeon: Hulen Luster, MD;  Location: [facility] ENDOSCOPY;  Service: Endoscopy;  Laterality: N/A;    FAMILY HISTORY :   Family History  Problem Relation Age of Onset  . Heart attack Mother   . Heart attack Father     SOCIAL HISTORY:   Social History  Substance Use Topics  . Smoking status: Former Research scientist (life sciences)  . Smokeless tobacco: Not on file  . Alcohol Use: No    ALLERGIES:  has No Known Allergies.  MEDICATIONS:  Current Outpatient Prescriptions  Medication Sig Dispense Refill  . allopurinol (ZYLOPRIM) 300 MG tablet Take 300 mg by mouth daily.    . ferrous fumarate-iron polysaccharide complex (TANDEM) 162-115.2 MG CAPS capsule Take 1 capsule by mouth daily with breakfast. 30 capsule 12  . Insulin Human (INSULIN PUMP) SOLN Pt uses Humalog insulin.    Marland Kitchen lactulose (CHRONULAC) 10 GM/15ML solution Take 30 g by mouth 2 (two) times daily as needed for mild constipation.    . midodrine (PROAMATINE) 5 MG tablet Take 5 mg by mouth 3 (three) times daily with meals.    . Multiple Vitamin (MULTIVITAMIN WITH MINERALS) TABS tablet Take 1 tablet by mouth daily.    . nitroGLYCERIN (  NITRODUR - DOSED IN MG/24 HR) 0.4 mg/hr patch Place 0.4 mg onto the skin daily as needed (for chest pain).     . nitroGLYCERIN (NITROSTAT) 0.4 MG SL tablet Place 0.4 mg under the tongue every 5 (five) minutes as needed for chest pain.    . pantoprazole (PROTONIX) 40 MG tablet Take 1 tablet (40 mg total) by mouth 2 (two) times daily before a meal. 60 tablet 0  . polyethylene glycol (MIRALAX / GLYCOLAX) packet Take 17 g by mouth daily as needed for mild constipation.    . sucralfate (CARAFATE) 1 g tablet Take 1 tablet (1 g total) by mouth 4 (four) times  daily. 120 tablet 0  . tamsulosin (FLOMAX) 0.4 MG CAPS capsule Take 0.4 mg by mouth daily after breakfast.     . GENERLAC 10 GM/15ML SOLN TK 30 ML PO BID  3   No current facility-administered medications for this visit.   Facility-Administered Medications Ordered in Other Visits  Medication Dose Route Frequency Provider Last Rate Last Dose  . sodium chloride 0.9 % injection 10 mL  10 mL Intravenous PRN Evlyn Kanner, NP   10 mL at 07/14/14 1125    PHYSICAL EXAMINATION: ECOG PERFORMANCE STATUS: 3 - Symptomatic, >50% confined to bed  BP 113/61 mmHg  Pulse 81  Temp(Src) 97 F (36.1 C) (Tympanic)  Resp 18  Wt 214 lb 11.7 oz (97.4 kg)  SpO2 97%  Filed Weights   06/04/15 1116  Weight: 214 lb 11.7 oz (97.4 kg)    GENERAL: Well-nourished well-developed; Alert, no distress and comfortable.  Accompanied by wife-multiple family members/ His wheelchair. Appears sick. EYES: Positive for pallor. Positive for mild icterus. OROPHARYNX: no thrush or ulceration; poor dentition  NECK: supple, no masses felt LYMPH:  no palpable lymphadenopathy in the cervical, axillary or inguinal regions LUNGS: Decreased breath sounds at the bases. No wheeze or crackles HEART/CVS: regular rate & rhythm and no murmurs; 2+ bilateral lower extremity edema ABDOMEN:abdomen soft, non-tender and normal bowel sounds; positive for splenomegaly. Musculoskeletal:no cyanosis of digits and no clubbing  PSYCH: alert & oriented x 3 with fluent speech NEURO: no focal motor/sensory deficits SKIN:  Multiple ecchymosis noted on the extremities/ chronic.  LABORATORY DATA:  I have reviewed the data as listed    Component Value Date/Time   NA 131* 06/04/2015 1053   NA 126* 05/03/2014 1112   K 5.1 06/04/2015 1053   K 4.8 05/03/2014 1112   CL 103 06/04/2015 1053   CL 97* 05/03/2014 1112   CO2 18* 06/04/2015 1053   CO2 22 05/03/2014 1112   GLUCOSE 181* 06/04/2015 1053   GLUCOSE 93 05/03/2014 1112   BUN 39* 06/04/2015  1053   BUN 30* 05/03/2014 1112   CREATININE 1.46* 06/04/2015 1053   CREATININE 1.55* 05/03/2014 1112   CALCIUM 8.9 06/04/2015 1053   CALCIUM 8.8* 05/03/2014 1112   PROT 7.5 06/04/2015 1053   PROT 7.2 05/03/2014 1112   ALBUMIN 3.2* 06/04/2015 1053   ALBUMIN 3.5 05/03/2014 1112   AST 37 06/04/2015 1053   AST 27 05/03/2014 1112   ALT 25 06/04/2015 1053   ALT 14* 05/03/2014 1112   ALKPHOS 141* 06/04/2015 1053   ALKPHOS 105 05/03/2014 1112   BILITOT 0.8 06/04/2015 1053   BILITOT 0.9 05/03/2014 1112   GFRNONAA 48* 06/04/2015 1053   GFRNONAA 46* 05/03/2014 1112   GFRNONAA 33* 03/06/2014 0959   GFRAA 55* 06/04/2015 1053   GFRAA 53* 05/03/2014 1112   GFRAA 40* 03/06/2014  8786    No results found for: SPEP, UPEP  Lab Results  Component Value Date   WBC 3.0* 06/04/2015   NEUTROABS 2.5 06/04/2015   HGB 9.9* 06/04/2015   HCT 29.9* 06/04/2015   MCV 88.9 06/04/2015   PLT 108* 06/04/2015      Chemistry      Component Value Date/Time   NA 131* 06/04/2015 1053   NA 126* 05/03/2014 1112   K 5.1 06/04/2015 1053   K 4.8 05/03/2014 1112   CL 103 06/04/2015 1053   CL 97* 05/03/2014 1112   CO2 18* 06/04/2015 1053   CO2 22 05/03/2014 1112   BUN 39* 06/04/2015 1053   BUN 30* 05/03/2014 1112   CREATININE 1.46* 06/04/2015 1053   CREATININE 1.55* 05/03/2014 1112      Component Value Date/Time   CALCIUM 8.9 06/04/2015 1053   CALCIUM 8.8* 05/03/2014 1112   ALKPHOS 141* 06/04/2015 1053   ALKPHOS 105 05/03/2014 1112   AST 37 06/04/2015 1053   AST 27 05/03/2014 1112   ALT 25 06/04/2015 1053   ALT 14* 05/03/2014 1112   BILITOT 0.8 06/04/2015 1053   BILITOT 0.9 05/03/2014 1112        ASSESSMENT & PLAN:   # Limited stage small cell lung cancer status post chemoradiation. Repeated CT scan shows no concerns for any recurrent disease. Stable left lung radiation changes; right base of lung rounded atelectasis and small pleural effusion.  # Bilateral hip pain- bone scan negative for  metastatic disease. L4 arthritic uptake question cause of his back pain. Discussed regarding use of steroid injections. Also discussed regarding use of Tylenol less than 1 g per day.  # Pancytopenia- cirrhosis/splenomegaly. White count 3 hemoglobin 9.5/ platelets 108. I paged Ms.London/GI . Discussed regarding patient's cirrhosis.  # History of anemia likely iron deficiency/liver disease. History of recent GI bleed.  # Right buttock decubitus ulcer stage I. Recommend wound care.  # Debility/generalized weakness- I think it is multifactorial/ combination of his cirrhosis/CKD/failure to thrive/previous brain radiation. Continue recommend physical therapy. I reviewed the images myself and with the patient and family in detail; a copy of the report was given.  # Patient follow-up with me in approximately 3 months with labs.  I also recommend patient follow-up with GI/PCP.  # 25 minutes face-to-face with the patient discussing the above plan of care; more than 50% of time spent on prognosis/ natural history; counseling and coordination.     Cammie Sickle, MD 06/04/2015 11:32 AM

## 2015-06-04 NOTE — Progress Notes (Signed)
Mild erythema present on sacrum upon arrival to cancer center. Stage 1 pressure ulcer-skin dry and intact.  Pt currently using hydrocortisone cream to tx the erythema. Pt states "site stings and burns when wiping s/p bowel movements." Pt sits down more than 75% of his day. (See nursing flowsheet for assessment for ulcer staging/measurements/size).  10 minutes of Patient education performed on preventing pressure ulcers.  Pt & wife instructed to change positions frequently and to use pillows under buttock to reduce pressure on the site. Wife was provided with information on barrier cream & hydrocolloid dressings. Pt Has difficulty changing positions without assist due to lower extremity weakness and persistent ascites.  Dr. Rogue Bussing present with RN with pt's skin assessment.  A hydrocolloid dressing was placed on sacrum to prevent further skin breakdown.

## 2015-06-04 NOTE — Patient Instructions (Signed)
Pressure Injury A pressure injury, sometimes called a bedsore, is an injury to the skin and underlying tissue caused by pressure. Pressure on blood vessels causes decreased blood flow to the skin, which can eventually cause the skin tissue to die and break down into a wound. Pressure injuries usually occur:  Over bony parts of the body such as the tailbone, shoulders, elbows, hips, and heels.  Under medical devices such as respiratory equipment, stockings, tubes, and splints. Pressure injuries start as reddened areas on the skin and can lead to pain, muscle damage, and infection. Pressure injuries can vary in severity.  CAUSES Pressure injuries are caused by a lack of blood supply to an area of skin. They can occur from intense pressure over a short period of time or from less intense pressure over a long period of time. RISK FACTORS This condition is more likely to develop in people who:  Are in the hospital or an extended care facility.  Are bedridden or in a wheelchair.  Have an injury or disease that keeps them from:  Moving normally.  Feeling pain or pressure.  Have a condition that:  Makes them sleepy or less alert.  Causes poor blood flow.  Need to wear a medical device.  Have poor control of their bladder or bowel functions (incontinence).  Have poor nutrition (malnutrition).  Are of certain ethnicities. People of African American and Latino or Hispanic descent are at higher risk compared to other ethnic groups. If you are at risk for pressure ulcers, your health care provider may recommend certain types of bedding to help prevent them. These may include foam or gel mattresses covered with one of the following:  A sheepskin blanket.  A pad that is filled with gel, air, water, or foam. SYMPTOMS  The main symptom is a blister or change in skin color that opens into a wound. Other symptoms include:   Red or dark areas of skin that do not turn white or pale when  pressed with a finger.   Pain, warmth, or change of skin texture.  DIAGNOSIS This condition is diagnosed with a medical history and physical exam. You may also have tests, including:   Blood tests to check for infection or signs of poor nutrition.  Imaging studies to check for damage to the deep tissues under your skin.  Blood flow studies. Your pressure injury will be staged to determine its severity. Staging is an assessment of:  The depth of the pressure injury.  Which tissues are exposed because of the pressure injury.  The causes of the pressure injury. TREATMENT The main focus of treatment is to help your injury heal. This may be done by:   Relieving or redistributing pressure on your skin. This includes:  Frequently changing your position.  Eliminating or minimizing positions that caused the wound or that can make the wound worse.  Using specific bed mattresses and chair cushions.  Refitting, resizing, or replacing any medical devices, or padding the skin under them.  Using creams or powders to prevent rubbing (friction) on the skin.  Keeping your skin clean and dry. This may include using a skin cleanser or skin protectant as told by your health care provider. This may be a lotion, ointment, or spray.  Cleaning your injury and removing any dead tissue from the wound (debridement).  Placing a bandage (dressing) over your injury.  Preventing or treating infection. This may include antibiotic, antimicrobial, or antiseptic medicines. Treatment may also include medicine for pain.  Sometimes surgery is needed to close the wound with a flap of healthy skin or a piece of skin from another area of your body (graft). You may need surgery if other treatments are not working or if your injury is very deep. HOME CARE INSTRUCTIONS Wound Care  Follow instructions from your health care provider about:  How to take care of your wound.  When and how you should change your  dressing.  When you should remove your dressing. If your dressing is dry and stuck when you try to remove it, moisten or wet the dressing with saline or water so that it can be removed without harming your skin or wound tissue.  Check your wound every day for signs of infection. Have a caregiver do this for you if you are not able. Watch for:  More redness, swelling, or pain.  More fluid, blood, or pus.  A bad smell. Skin Care  Keep your skin clean and dry. Gently pat your skin dry.  Do not rub or massage your skin.  Use a skin protectant only as told by your health care provider.  Check your skin every day for any changes in color or any new blisters or sores (ulcers). Have a caregiver do this for you if you are not able. Medicines  Take over-the-counter and prescription medicines only as told by your health care provider.  If you were prescribed an antibiotic medicine, take it or apply it as told by your health care provider. Do not stop taking or using the antibiotic even if your condition improves. Reducing and Redistributing Pressure  Do not lie or sit in one position for a long time. Move or change position every two hours or as told by your health care provider.  Use pillows or cushions to reduce pressure. Ask your health care provider to recommend cushions or pads for you.  Use medical devices that do not rub your skin. Tell your health care provider if one of your medical devices is causing a pressure injury to develop. General Instructions  Eat a healthy diet that includes lots of protein. Ask your health care provider for diet advice.  Drink enough fluid to keep your urine clear or pale yellow.  Be as active as you can every day. Ask your health care provider to suggest safe exercises or activities.  Do not abuse drugs or alcohol.  Keep all follow-up visits as told by your health care provider. This is important.  Do not smoke. SEEK MEDICAL CARE IF:  You  have chills or fever.  Your pain medicine is not helping.  You have any changes in skin color.  You have new blisters or sores.  You develop warmth, redness, or swelling near a pressure injury.  You have a bad odor or pus coming from your pressure injury.  You lose control of your bowels or bladder.  You develop new symptoms.  Your wound does not improve after 1-2 weeks of treatment.  You develop a new medical condition, such as diabetes, peripheral vascular disease, or conditions that affect your defense (immune) system.   This information is not intended to replace advice given to you by your health care provider. Make sure you discuss any questions you have with your health care provider.   Document Released: 01/13/2005 Document Revised: 10/04/2014 Document Reviewed: 05/24/2014 Elsevier Interactive Patient Education 2016 [employer]   Skin Ulcer A skin ulcer is an open sore that can be shallow or deep. Skin ulcers sometimes  become infected and are difficult to treat. It may be 1 month or longer before real healing progress is made. CAUSES   Injury.  Problems with the veins or arteries.  Diabetes.  Insect bites.  Bedsores.  Inflammatory conditions. SYMPTOMS   Pain, redness, swelling, and tenderness around the ulcer.  Fever.  Bleeding from the ulcer.  Yellow or clear fluid coming from the ulcer. DIAGNOSIS  There are many types of skin ulcers. Any open sores will be examined. Certain tests will be done to determine the kind of ulcer you have. The right treatment depends on the type of ulcer you have. TREATMENT  Treatment is a long-term challenge. It may include:  Wearing an elastic wrap, compression stockings, or gel cast over the ulcer area.  Taking antibiotic medicines or putting antibiotic creams on the affected area if there is an infection. HOME CARE INSTRUCTIONS  Put on your bandages (dressings), wraps, or casts over the ulcer as directed by your  caregiver.  Change all dressings as directed by your caregiver.  Take all medicines as directed by your caregiver.  Keep the affected area clean and dry.  Avoid injuries to the affected area.  Eat a well-balanced, healthy diet that includes plenty of fruit and vegetables.  If you smoke, consider quitting or decreasing the amount of cigarettes you smoke.  Once the ulcer heals, get regular exercise as directed by your caregiver.  Work with your caregiver to make sure your blood pressure, cholesterol, and diabetes are well-controlled.  Keep your skin moisturized. Dry skin can crack and lead to skin ulcers. SEEK IMMEDIATE MEDICAL CARE IF:   Your pain gets worse.  You have swelling, redness, or fluids around the ulcer.  You have chills.  You have a fever. MAKE SURE YOU:   Understand these instructions.  Will watch your condition.  Will get help right away if you are not doing well or get worse.   This information is not intended to replace advice given to you by your health care provider. Make sure you discuss any questions you have with your health care provider.   Document Released: 02/21/2004 Document Revised: 04/07/2011 Document Reviewed: 08/30/2010 Elsevier Interactive Patient Education [employer].

## 2015-07-02 ENCOUNTER — Inpatient Hospital Stay: Payer: Medicare Other | Attending: Internal Medicine

## 2015-07-02 DIAGNOSIS — Z95828 Presence of other vascular implants and grafts: Secondary | ICD-10-CM

## 2015-07-02 DIAGNOSIS — Z85118 Personal history of other malignant neoplasm of bronchus and lung: Secondary | ICD-10-CM | POA: Diagnosis not present

## 2015-07-02 DIAGNOSIS — Z452 Encounter for adjustment and management of vascular access device: Secondary | ICD-10-CM | POA: Insufficient documentation

## 2015-07-02 MED ORDER — HEPARIN SOD (PORK) LOCK FLUSH 100 UNIT/ML IV SOLN
500.0000 [IU] | Freq: Once | INTRAVENOUS | Status: AC
  Administered 2015-07-02: 500 [IU] via INTRAVENOUS

## 2015-07-02 MED ORDER — SODIUM CHLORIDE 0.9% FLUSH
10.0000 mL | INTRAVENOUS | Status: DC | PRN
  Administered 2015-07-02: 10 mL via INTRAVENOUS
  Filled 2015-07-02: qty 10

## 2015-07-10 ENCOUNTER — Other Ambulatory Visit: Payer: Medicare Other

## 2015-07-10 ENCOUNTER — Ambulatory Visit: Payer: Medicare Other

## 2015-07-12 ENCOUNTER — Other Ambulatory Visit: Payer: Medicare Other

## 2015-07-12 ENCOUNTER — Ambulatory Visit: Payer: Medicare Other | Admitting: Internal Medicine

## 2015-07-16 ENCOUNTER — Inpatient Hospital Stay: Payer: Medicare Other

## 2015-08-23 ENCOUNTER — Other Ambulatory Visit: Payer: Self-pay | Admitting: Gastroenterology

## 2015-08-23 DIAGNOSIS — K745 Biliary cirrhosis, unspecified: Secondary | ICD-10-CM

## 2015-08-28 ENCOUNTER — Other Ambulatory Visit: Payer: Self-pay | Admitting: Neurosurgery

## 2015-08-28 ENCOUNTER — Ambulatory Visit
Admission: RE | Admit: 2015-08-28 | Discharge: 2015-08-28 | Disposition: A | Discharge status: Home / Self Care | Payer: Medicare Other | Source: Ambulatory Visit | Attending: Gastroenterology | Admitting: Gastroenterology

## 2015-08-28 DIAGNOSIS — K745 Biliary cirrhosis, unspecified: Secondary | ICD-10-CM | POA: Diagnosis present

## 2015-08-28 DIAGNOSIS — R188 Other ascites: Secondary | ICD-10-CM | POA: Insufficient documentation

## 2015-08-28 DIAGNOSIS — J90 Pleural effusion, not elsewhere classified: Secondary | ICD-10-CM | POA: Insufficient documentation

## 2015-08-28 DIAGNOSIS — R161 Splenomegaly, not elsewhere classified: Secondary | ICD-10-CM | POA: Insufficient documentation

## 2015-08-30 ENCOUNTER — Other Ambulatory Visit: Payer: Self-pay | Admitting: Gastroenterology

## 2015-08-30 DIAGNOSIS — K7460 Unspecified cirrhosis of liver: Secondary | ICD-10-CM

## 2015-08-31 ENCOUNTER — Other Ambulatory Visit
Admission: RE | Admit: 2015-08-31 | Discharge: 2015-08-31 | Disposition: A | Discharge status: Home / Self Care | Payer: Medicare Other | Source: Ambulatory Visit | Attending: Gastroenterology | Admitting: Gastroenterology

## 2015-08-31 DIAGNOSIS — K7460 Unspecified cirrhosis of liver: Secondary | ICD-10-CM | POA: Diagnosis present

## 2015-08-31 LAB — APTT: aPTT: 36 seconds (ref 24–36)

## 2015-09-03 ENCOUNTER — Other Ambulatory Visit: Payer: Self-pay | Admitting: Physician Assistant

## 2015-09-04 ENCOUNTER — Ambulatory Visit
Admission: RE | Admit: 2015-09-04 | Discharge: 2015-09-04 | Disposition: A | Discharge status: Home / Self Care | Payer: Medicare Other | Source: Ambulatory Visit | Attending: Gastroenterology | Admitting: Gastroenterology

## 2015-09-04 DIAGNOSIS — K7460 Unspecified cirrhosis of liver: Secondary | ICD-10-CM | POA: Diagnosis not present

## 2015-09-04 DIAGNOSIS — R188 Other ascites: Secondary | ICD-10-CM | POA: Insufficient documentation

## 2015-09-04 LAB — HEPATIC FUNCTION PANEL
ALBUMIN: 3.5 g/dL (ref 3.5–5.0)
ALT: 17 U/L (ref 17–63)
AST: 37 U/L (ref 15–41)
Alkaline Phosphatase: 114 U/L (ref 38–126)
BILIRUBIN DIRECT: 0.2 mg/dL (ref 0.1–0.5)
BILIRUBIN INDIRECT: 0.6 mg/dL (ref 0.3–0.9)
BILIRUBIN TOTAL: 0.8 mg/dL (ref 0.3–1.2)
Total Protein: 7.6 g/dL (ref 6.5–8.1)

## 2015-09-04 LAB — CBC
HCT: 34.2 % — ABNORMAL LOW (ref 40.0–52.0)
HEMOGLOBIN: 11.2 g/dL — AB (ref 13.0–18.0)
MCH: 28.6 pg (ref 26.0–34.0)
MCHC: 32.6 g/dL (ref 32.0–36.0)
MCV: 87.5 fL (ref 80.0–100.0)
PLATELETS: 78 10*3/uL — AB (ref 150–440)
RBC: 3.9 MIL/uL — AB (ref 4.40–5.90)
RDW: 20.6 % — ABNORMAL HIGH (ref 11.5–14.5)
WBC: 2.1 10*3/uL — AB (ref 3.8–10.6)

## 2015-09-04 LAB — BODY FLUID CELL COUNT WITH DIFFERENTIAL
EOS FL: 0 %
LYMPHS FL: 60 %
Monocyte-Macrophage-Serous Fluid: 39 %
NEUTROPHIL FLUID: 1 %
OTHER CELLS FL: 0 %
Total Nucleated Cell Count, Fluid: 127 cu mm

## 2015-09-04 LAB — BASIC METABOLIC PANEL
ANION GAP: 7 (ref 5–15)
BUN: 27 mg/dL — ABNORMAL HIGH (ref 6–20)
CALCIUM: 9.2 mg/dL (ref 8.9–10.3)
CO2: 25 mmol/L (ref 22–32)
CREATININE: 1.41 mg/dL — AB (ref 0.61–1.24)
Chloride: 98 mmol/L — ABNORMAL LOW (ref 101–111)
GFR calc non Af Amer: 49 mL/min — ABNORMAL LOW (ref >60)
GFR, EST AFRICAN AMERICAN: 57 mL/min — AB (ref >60)
Glucose, Bld: 94 mg/dL (ref 65–99)
Potassium: 4.4 mmol/L (ref 3.5–5.1)
SODIUM: 130 mmol/L — AB (ref 135–145)

## 2015-09-04 LAB — PROTEIN, BODY FLUID

## 2015-09-04 LAB — GRAM STAIN

## 2015-09-04 LAB — PROTIME-INR
INR: 1.26
PROTHROMBIN TIME: 15.9 s — AB (ref 11.4–15.2)

## 2015-09-04 LAB — ALBUMIN, FLUID (OTHER): ALBUMIN FL: 1.2 g/dL

## 2015-09-04 NOTE — Procedures (Signed)
Under US guidance, paracentesis was performed. Sample sent to lab. No immediate complication.

## 2015-09-05 LAB — MISC LABCORP TEST (SEND OUT): Labcorp test code: 19588

## 2015-09-05 LAB — CYTOLOGY - NON PAP

## 2015-09-06 ENCOUNTER — Inpatient Hospital Stay (HOSPITAL_BASED_OUTPATIENT_CLINIC_OR_DEPARTMENT_OTHER): Payer: Medicare Other | Admitting: Internal Medicine

## 2015-09-06 ENCOUNTER — Inpatient Hospital Stay: Payer: Medicare Other

## 2015-09-06 ENCOUNTER — Inpatient Hospital Stay: Payer: Medicare Other | Attending: Internal Medicine

## 2015-09-06 VITALS — BP 101/66 | HR 78 | Temp 97.3°F | Resp 18 | Wt 221.4 lb

## 2015-09-06 DIAGNOSIS — E785 Hyperlipidemia, unspecified: Secondary | ICD-10-CM | POA: Diagnosis not present

## 2015-09-06 DIAGNOSIS — D696 Thrombocytopenia, unspecified: Secondary | ICD-10-CM | POA: Diagnosis not present

## 2015-09-06 DIAGNOSIS — M25551 Pain in right hip: Secondary | ICD-10-CM | POA: Diagnosis not present

## 2015-09-06 DIAGNOSIS — Z923 Personal history of irradiation: Secondary | ICD-10-CM | POA: Insufficient documentation

## 2015-09-06 DIAGNOSIS — Z79899 Other long term (current) drug therapy: Secondary | ICD-10-CM

## 2015-09-06 DIAGNOSIS — C801 Malignant (primary) neoplasm, unspecified: Secondary | ICD-10-CM

## 2015-09-06 DIAGNOSIS — Z794 Long term (current) use of insulin: Secondary | ICD-10-CM | POA: Diagnosis not present

## 2015-09-06 DIAGNOSIS — Z452 Encounter for adjustment and management of vascular access device: Secondary | ICD-10-CM

## 2015-09-06 DIAGNOSIS — N2889 Other specified disorders of kidney and ureter: Secondary | ICD-10-CM | POA: Insufficient documentation

## 2015-09-06 DIAGNOSIS — R161 Splenomegaly, not elsewhere classified: Secondary | ICD-10-CM | POA: Insufficient documentation

## 2015-09-06 DIAGNOSIS — J449 Chronic obstructive pulmonary disease, unspecified: Secondary | ICD-10-CM | POA: Diagnosis not present

## 2015-09-06 DIAGNOSIS — Z85118 Personal history of other malignant neoplasm of bronchus and lung: Secondary | ICD-10-CM | POA: Diagnosis not present

## 2015-09-06 DIAGNOSIS — I10 Essential (primary) hypertension: Secondary | ICD-10-CM | POA: Insufficient documentation

## 2015-09-06 DIAGNOSIS — Z8719 Personal history of other diseases of the digestive system: Secondary | ICD-10-CM | POA: Insufficient documentation

## 2015-09-06 DIAGNOSIS — M25552 Pain in left hip: Secondary | ICD-10-CM

## 2015-09-06 DIAGNOSIS — D61818 Other pancytopenia: Secondary | ICD-10-CM | POA: Insufficient documentation

## 2015-09-06 DIAGNOSIS — Z87891 Personal history of nicotine dependence: Secondary | ICD-10-CM | POA: Insufficient documentation

## 2015-09-06 DIAGNOSIS — C3412 Malignant neoplasm of upper lobe, left bronchus or lung: Secondary | ICD-10-CM | POA: Insufficient documentation

## 2015-09-06 DIAGNOSIS — D509 Iron deficiency anemia, unspecified: Secondary | ICD-10-CM

## 2015-09-06 DIAGNOSIS — I959 Hypotension, unspecified: Secondary | ICD-10-CM | POA: Insufficient documentation

## 2015-09-06 DIAGNOSIS — E119 Type 2 diabetes mellitus without complications: Secondary | ICD-10-CM

## 2015-09-06 DIAGNOSIS — I509 Heart failure, unspecified: Secondary | ICD-10-CM

## 2015-09-06 DIAGNOSIS — K7460 Unspecified cirrhosis of liver: Secondary | ICD-10-CM | POA: Diagnosis not present

## 2015-09-06 DIAGNOSIS — Z9221 Personal history of antineoplastic chemotherapy: Secondary | ICD-10-CM

## 2015-09-06 DIAGNOSIS — R7989 Other specified abnormal findings of blood chemistry: Secondary | ICD-10-CM | POA: Insufficient documentation

## 2015-09-06 DIAGNOSIS — R188 Other ascites: Secondary | ICD-10-CM | POA: Diagnosis not present

## 2015-09-06 DIAGNOSIS — J9811 Atelectasis: Secondary | ICD-10-CM

## 2015-09-06 DIAGNOSIS — C3492 Malignant neoplasm of unspecified part of left bronchus or lung: Secondary | ICD-10-CM

## 2015-09-06 LAB — COMPREHENSIVE METABOLIC PANEL
ALBUMIN: 3.2 g/dL — AB (ref 3.5–5.0)
ALT: 16 U/L — AB (ref 17–63)
AST: 33 U/L (ref 15–41)
Alkaline Phosphatase: 102 U/L (ref 38–126)
Anion gap: 8 (ref 5–15)
BUN: 34 mg/dL — AB (ref 6–20)
CHLORIDE: 97 mmol/L — AB (ref 101–111)
CO2: 23 mmol/L (ref 22–32)
CREATININE: 1.46 mg/dL — AB (ref 0.61–1.24)
Calcium: 8.7 mg/dL — ABNORMAL LOW (ref 8.9–10.3)
GFR calc Af Amer: 55 mL/min — ABNORMAL LOW (ref >60)
GFR, EST NON AFRICAN AMERICAN: 47 mL/min — AB (ref >60)
Glucose, Bld: 135 mg/dL — ABNORMAL HIGH (ref 65–99)
POTASSIUM: 4.4 mmol/L (ref 3.5–5.1)
SODIUM: 128 mmol/L — AB (ref 135–145)
Total Bilirubin: 0.5 mg/dL (ref 0.3–1.2)
Total Protein: 7.2 g/dL (ref 6.5–8.1)

## 2015-09-06 MED ORDER — HEPARIN SOD (PORK) LOCK FLUSH 100 UNIT/ML IV SOLN
500.0000 [IU] | Freq: Once | INTRAVENOUS | Status: AC
  Administered 2015-09-06: 500 [IU] via INTRAVENOUS

## 2015-09-06 MED ORDER — SODIUM CHLORIDE 0.9% FLUSH
10.0000 mL | INTRAVENOUS | Status: DC | PRN
  Administered 2015-09-06: 10 mL via INTRAVENOUS
  Filled 2015-09-06: qty 10

## 2015-09-06 NOTE — Assessment & Plan Note (Addendum)
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 May 2017 CT scan shows no concerns for any recurrent disease. Stable left lung radiation changes; right base of lung rounded atelectasis and small pleural effusion.  # Bilateral hip pain- bone scan negative for metastatic disease.   # Pancytopenia- cirrhosis/splenomegaly. Labs pending.  # Decompensated cirrhosis- ascites status post paracentesis. Defer to GI for management   # Patient follow-up with me in approximately 4 months with labs. CT chest noncontrast week prior. Overall poor prognosis.

## 2015-09-06 NOTE — Progress Notes (Signed)
[facility] OFFICE PROGRESS NOTE  [name] Care Team: Derinda Late, MD as PCP - General (Family Medicine)   SUMMARY OF ONCOLOGIC HISTORY:  Oncology History   The [name] was diagnosed with a limited stage Cameron [name]-cell lung cancer. He received 4 cycles of carboplatin and etoposide from 08/24/2013 until 11/07/2013. Review of his pre-treatment labs notes that he had a baseline low platelet count, approximately 100,000. Followup PET scan on 01/25/2014 revealed a complete remission. 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 He then received prophylactic cranial radiation from 01/28 until 03/16/2014. According to the [name], he has been on a tapering dose of steroids per Dr. Baruch Gouty of radiation oncology.    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  [name] Cytology negative for malignancy. Suspicious for cirrhosis.  [name] Mild shortness of breath. Not any worse. Chronic swelling in the legs. Easy bruising. Not any worse.  REVIEW  OF SYSTEMS:  A complete 10 point review of system is done which is negative except mentioned above/history of present illness.   PAST MEDICAL HISTORY :  Past Medical History:  Diagnosis Date  . Ascites   . Azotemia   . Cataracts, both eyes   . CHF (congestive heart failure) (Cameron [name])   . Cirrhosis (Cameron [name] [name])   . COPD (chronic obstructive pulmonary disease) (Cameron [name] [name])   . Detached retina   . Diabetes ([name])   . GI bleed   . History of chemotherapy    finished July 2015-4 cycles of carbo/taxol  . HTN (hypertension)   . Hyperlipidemia   . IDA (iron deficiency anemia)   . Nephrotic range proteinuria   . Pleural effusion   . Renal insufficiency   . Cameron [name] cell carcinoma of lung (Cameron [name] [name]) 07/27/2014  . Cameron [name] cell lung cancer (Cameron [name] [name])   . Splenomegaly   . Thrombocytopenia ([place])     PAST SURGICAL HISTORY :   Past Surgical History:  Procedure Laterality Date  . CARDIAC STENTS    . COLONOSCOPY  12/18/12  . ESOPHAGOGASTRODUODENOSCOPY (EGD) WITH PROPOFOL N/A 05/11/2015   Procedure: ESOPHAGOGASTRODUODENOSCOPY (EGD) WITH PROPOFOL;  Surgeon: Hulen Luster, MD;  Location: [facility] END[facility]COPY;  Service: Endoscopy;  Laterality: N/A;  . FISTULA REPAIR    . LAMINECTOMY      FAMILY HISTORY :   Family History  Problem Relation Age of Onset  . Heart attack Mother   . Heart attack Father     SOCIAL HISTORY:   Social History  Substance Use Topics  . Smoking status: Former Research scientist (life sciences)  . Smokeless tobacco: Not on file  . Alcohol  use No    ALLERGIES:  is allergic to tape.  MEDICATIONS:  Current Outpatient Prescriptions  Medication Sig Dispense Refill  . allopurinol (ZYLOPRIM) 300 MG tablet Take 300 mg by mouth daily.    . ferrous fumarate-iron polysaccharide complex (TANDEM) 162-115.2 MG CAPS capsule Take 1 capsule by mouth daily with breakfast. 30 capsule 12  . GENERLAC 10 GM/15ML SOLN TK 30 ML PO BID  3  . Insulin Human (INSULIN PUMP) SOLN Pt uses Humalog insulin.    Marland Kitchen lactulose (CHRONULAC) 10 GM/15ML solution Take  30 g by mouth 2 (two) times daily as needed for mild constipation.    . midodrine (PROAMATINE) 5 MG tablet Take 5 mg by mouth 3 (three) times daily with meals.    . Multiple Vitamin (MULTIVITAMIN WITH MINERALS) TABS tablet Take 1 tablet by mouth daily.    . nitroGLYCERIN (NITRODUR - DOSED IN MG/24 HR) 0.4 mg/hr patch Place 0.4 mg onto the skin daily as needed (for chest pain).     . nitroGLYCERIN (NITROSTAT) 0.4 MG SL tablet Place 0.4 mg under the tongue every 5 (five) minutes as needed for chest pain.    . pantoprazole (PROTONIX) 40 MG tablet Take 1 tablet (40 mg total) by mouth 2 (two) times daily before a meal. 60 tablet 0  . polyethylene glycol (MIRALAX / GLYCOLAX) packet Take 17 g by mouth daily as needed for mild constipation.    . sucralfate (CARAFATE) 1 g tablet Take 1 tablet (1 g total) by mouth 4 (four) times daily. 120 tablet 0  . tamsulosin (FLOMAX) 0.4 MG CAPS capsule Take 0.4 mg by mouth daily after breakfast.     . rifaximin (XIFAXAN) 550 MG TABS tablet Take by mouth.     Current Facility-Administered Medications  Medication Dose Route Frequency Provider Last Rate Last Dose  . sodium chloride flush (NS) 0.9 % injection 10 mL  10 mL Intravenous PRN Cammie Sickle, MD   10 mL at 09/06/15 1044   Facility-Administered Medications Ordered in Other Visits  Medication Dose Route Frequency Provider Last Rate Last Dose  . sodium chloride 0.9 % injection 10 mL  10 mL Intravenous PRN Evlyn Kanner, NP   10 mL at 07/14/14 1125    PHYSICAL EXAMINATION: ECOG PERFORMANCE STATUS: 3 - Symptomatic, >50% confined to bed  BP 101/66 (BP Location: Left Arm, [name] Position: Sitting)   Pulse 78   Temp 97.3 F (36.3 C) (Tympanic)   Resp 18   Wt 221 lb 6 oz (100.4 kg)   BMI 28.42 kg/m   Filed Weights   09/06/15 1104  Weight: 221 lb 6 oz (100.4 kg)    GENERAL: Well-nourished well-developed; Alert, no distress and comfortable.  Accompanied by wife-multiple family members/ His  wheelchair. Appears sick. EYES: Positive for pallor. Positive for mild icterus. OROPHARYNX: no thrush or ulceration; poor dentition  NECK: supple, no masses felt LYMPH:  no palpable lymphadenopathy in the cervical, axillary or inguinal regions LUNGS: Decreased breath sounds at the bases. No wheeze or crackles HEART/CVS: regular rate & rhythm and no murmurs; 2+ bilateral lower extremity edema ABDOMEN:abdomen soft, non-tender and normal bowel sounds; positive for splenomegaly. Musculoskeletal:no cyanosis of digits and no clubbing  PSYCH: alert & oriented x 3 with fluent speech NEURO: no focal motor/sensory deficits SKIN:  Multiple ecchymosis noted on the extremities/ chronic.  LABORATORY DATA:  I have reviewed the data as listed    Component Value Date/Time   NA 128 (L) 09/06/2015 1039  NA 126 (L) 05/03/2014 1112   K 4.4 09/06/2015 1039   K 4.8 05/03/2014 1112   CL 97 (L) 09/06/2015 1039   CL 97 (L) 05/03/2014 1112   CO2 23 09/06/2015 1039   CO2 22 05/03/2014 1112   GLUCOSE 135 (H) 09/06/2015 1039   GLUCOSE 93 05/03/2014 1112   BUN 34 (H) 09/06/2015 1039   BUN 30 (H) 05/03/2014 1112   CREATININE 1.46 (H) 09/06/2015 1039   CREATININE 1.55 (H) 05/03/2014 1112   CALCIUM 8.7 (L) 09/06/2015 1039   CALCIUM 8.8 (L) 05/03/2014 1112   PROT 7.2 09/06/2015 1039   PROT 7.2 05/03/2014 1112   ALBUMIN 3.2 (L) 09/06/2015 1039   ALBUMIN 3.5 05/03/2014 1112   AST 33 09/06/2015 1039   AST 27 05/03/2014 1112   ALT 16 (L) 09/06/2015 1039   ALT 14 (L) 05/03/2014 1112   ALKPHOS 102 09/06/2015 1039   ALKPHOS 105 05/03/2014 1112   BILITOT 0.5 09/06/2015 1039   BILITOT 0.9 05/03/2014 1112   GFRNONAA 47 (L) 09/06/2015 1039   GFRNONAA 46 (L) 05/03/2014 1112   GFRAA 55 (L) 09/06/2015 1039   GFRAA 53 (L) 05/03/2014 1112    No results found for: SPEP, UPEP  Lab Results  Component Value Date   WBC 2.1 (L) 09/04/2015   NEUTROABS 2.5 06/04/2015   HGB 11.2 (L) 09/04/2015   HCT 34.2 (L)  09/04/2015   MCV 87.5 09/04/2015   PLT 78 (L) 09/04/2015      Chemistry      Component Value Date/Time   NA 128 (L) 09/06/2015 1039   NA 126 (L) 05/03/2014 1112   K 4.4 09/06/2015 1039   K 4.8 05/03/2014 1112   CL 97 (L) 09/06/2015 1039   CL 97 (L) 05/03/2014 1112   CO2 23 09/06/2015 1039   CO2 22 05/03/2014 1112   BUN 34 (H) 09/06/2015 1039   BUN 30 (H) 05/03/2014 1112   CREATININE 1.46 (H) 09/06/2015 1039   CREATININE 1.55 (H) 05/03/2014 1112      Component Value Date/Time   CALCIUM 8.7 (L) 09/06/2015 1039   CALCIUM 8.8 (L) 05/03/2014 1112   ALKPHOS 102 09/06/2015 1039   ALKPHOS 105 05/03/2014 1112   AST 33 09/06/2015 1039   AST 27 05/03/2014 1112   ALT 16 (L) 09/06/2015 1039   ALT 14 (L) 05/03/2014 1112   BILITOT 0.5 09/06/2015 1039   BILITOT 0.9 05/03/2014 1112        ASSESSMENT & PLAN:   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 May 2017 CT scan shows no concerns for any recurrent disease. Stable left lung radiation changes; right base of lung rounded atelectasis and Cameron [name] pleural effusion.  # Bilateral hip pain- bone scan negative for metastatic disease.   # Pancytopenia- cirrhosis/splenomegaly. Labs pending.  # Decompensated cirrhosis- ascites status post paracentesis. Defer to GI for management   # [name] follow-up with me in approximately 4 months with labs. CT chest noncontrast week prior. Overall poor prognosis.     Cammie Sickle, MD 09/06/2015 12:23 PM

## 2015-09-07 LAB — CBC WITH DIFFERENTIAL/PLATELET
BASOS ABS: 0 10*3/uL (ref 0–0.1)
Eosinophils Absolute: 0 10*3/uL (ref 0–0.7)
Eosinophils Relative: 3 %
HCT: 32.6 % — ABNORMAL LOW (ref 40.0–52.0)
Hemoglobin: 10.8 g/dL — ABNORMAL LOW (ref 13.0–18.0)
LYMPHS ABS: 0.1 10*3/uL — AB (ref 1.0–3.6)
Lymphocytes Relative: 8 %
MCH: 28.4 pg (ref 26.0–34.0)
MCHC: 33.1 g/dL (ref 32.0–36.0)
MCV: 85.7 fL (ref 80.0–100.0)
Monocytes Absolute: 0.3 10*3/uL (ref 0.2–1.0)
NEUTROS ABS: 1.3 10*3/uL — AB (ref 1.4–6.5)
PLATELETS: 79 10*3/uL — AB (ref 150–440)
RBC: 3.8 MIL/uL — AB (ref 4.40–5.90)
RDW: 21 % — AB (ref 11.5–14.5)
WBC: 1.8 10*3/uL — AB (ref 3.8–10.6)

## 2015-09-09 LAB — CULTURE, BODY FLUID-BOTTLE: CULTURE: NO GROWTH

## 2015-09-09 LAB — CULTURE, BODY FLUID W GRAM STAIN -BOTTLE

## 2015-09-10 NOTE — Progress Notes (Signed)
Called patient and LVM that labs are stable.  No new recommendations at this time.  Copy of labs to be mailed.

## 2015-10-18 ENCOUNTER — Inpatient Hospital Stay: Payer: Medicare Other

## 2015-10-19 ENCOUNTER — Telehealth: Payer: Self-pay | Admitting: *Deleted

## 2015-10-19 NOTE — Telephone Encounter (Signed)
Informed wife per VO Dr Rogue Bussing to use Delsym and Claritin. She repeated this back to me

## 2015-10-19 NOTE — Telephone Encounter (Signed)
Asking for abx for a cold, denies fever. C/o sore throat and runny nose. Does not want to go in to see PCP because he does not want to sit and wait to be seen. Has not tried OTC med because "not sure what he can take with all of his diseases" Please advise

## 2015-10-22 ENCOUNTER — Inpatient Hospital Stay: Payer: Medicare Other | Attending: Internal Medicine

## 2015-10-22 DIAGNOSIS — C3412 Malignant neoplasm of upper lobe, left bronchus or lung: Secondary | ICD-10-CM | POA: Insufficient documentation

## 2015-10-22 DIAGNOSIS — Z452 Encounter for adjustment and management of vascular access device: Secondary | ICD-10-CM | POA: Diagnosis not present

## 2015-10-22 DIAGNOSIS — Z95828 Presence of other vascular implants and grafts: Secondary | ICD-10-CM

## 2015-10-22 MED ORDER — SODIUM CHLORIDE 0.9% FLUSH
10.0000 mL | Freq: Once | INTRAVENOUS | Status: AC
  Administered 2015-10-22: 10 mL via INTRAVENOUS
  Filled 2015-10-22: qty 10

## 2015-10-22 MED ORDER — HEPARIN SOD (PORK) LOCK FLUSH 100 UNIT/ML IV SOLN
INTRAVENOUS | Status: AC
  Filled 2015-10-22: qty 5

## 2015-10-22 MED ORDER — HEPARIN SOD (PORK) LOCK FLUSH 100 UNIT/ML IV SOLN
500.0000 [IU] | Freq: Once | INTRAVENOUS | Status: AC
  Administered 2015-10-22: 500 [IU] via INTRAVENOUS

## 2015-11-29 ENCOUNTER — Inpatient Hospital Stay: Payer: Medicare Other

## 2015-11-30 ENCOUNTER — Ambulatory Visit
Admission: RE | Admit: 2015-11-30 | Discharge: 2015-11-30 | Disposition: A | Discharge status: Home / Self Care | Payer: Medicare Other | Source: Ambulatory Visit | Attending: Internal Medicine | Admitting: Internal Medicine

## 2015-11-30 DIAGNOSIS — K7460 Unspecified cirrhosis of liver: Secondary | ICD-10-CM | POA: Diagnosis not present

## 2015-11-30 DIAGNOSIS — Z923 Personal history of irradiation: Secondary | ICD-10-CM | POA: Insufficient documentation

## 2015-11-30 DIAGNOSIS — J90 Pleural effusion, not elsewhere classified: Secondary | ICD-10-CM | POA: Insufficient documentation

## 2015-11-30 DIAGNOSIS — C3412 Malignant neoplasm of upper lobe, left bronchus or lung: Secondary | ICD-10-CM | POA: Insufficient documentation

## 2015-12-07 ENCOUNTER — Inpatient Hospital Stay (HOSPITAL_BASED_OUTPATIENT_CLINIC_OR_DEPARTMENT_OTHER): Payer: Medicare Other | Admitting: Internal Medicine

## 2015-12-07 ENCOUNTER — Inpatient Hospital Stay: Payer: Medicare Other | Attending: Internal Medicine

## 2015-12-07 ENCOUNTER — Other Ambulatory Visit: Payer: Self-pay | Admitting: *Deleted

## 2015-12-07 ENCOUNTER — Inpatient Hospital Stay: Payer: Medicare Other

## 2015-12-07 VITALS — BP 135/77 | HR 59 | Temp 97.2°F | Resp 18 | Wt 242.0 lb

## 2015-12-07 DIAGNOSIS — Z87891 Personal history of nicotine dependence: Secondary | ICD-10-CM | POA: Insufficient documentation

## 2015-12-07 DIAGNOSIS — Z79899 Other long term (current) drug therapy: Secondary | ICD-10-CM | POA: Insufficient documentation

## 2015-12-07 DIAGNOSIS — D509 Iron deficiency anemia, unspecified: Secondary | ICD-10-CM | POA: Diagnosis not present

## 2015-12-07 DIAGNOSIS — R809 Proteinuria, unspecified: Secondary | ICD-10-CM

## 2015-12-07 DIAGNOSIS — R413 Other amnesia: Secondary | ICD-10-CM

## 2015-12-07 DIAGNOSIS — Z8719 Personal history of other diseases of the digestive system: Secondary | ICD-10-CM | POA: Diagnosis not present

## 2015-12-07 DIAGNOSIS — Z923 Personal history of irradiation: Secondary | ICD-10-CM

## 2015-12-07 DIAGNOSIS — Z9221 Personal history of antineoplastic chemotherapy: Secondary | ICD-10-CM

## 2015-12-07 DIAGNOSIS — J9811 Atelectasis: Secondary | ICD-10-CM

## 2015-12-07 DIAGNOSIS — Z85118 Personal history of other malignant neoplasm of bronchus and lung: Secondary | ICD-10-CM | POA: Diagnosis present

## 2015-12-07 DIAGNOSIS — K7581 Nonalcoholic steatohepatitis (NASH): Secondary | ICD-10-CM

## 2015-12-07 DIAGNOSIS — E119 Type 2 diabetes mellitus without complications: Secondary | ICD-10-CM | POA: Insufficient documentation

## 2015-12-07 DIAGNOSIS — C3492 Malignant neoplasm of unspecified part of left bronchus or lung: Secondary | ICD-10-CM

## 2015-12-07 DIAGNOSIS — E785 Hyperlipidemia, unspecified: Secondary | ICD-10-CM | POA: Diagnosis not present

## 2015-12-07 DIAGNOSIS — D61818 Other pancytopenia: Secondary | ICD-10-CM

## 2015-12-07 DIAGNOSIS — K7460 Unspecified cirrhosis of liver: Secondary | ICD-10-CM | POA: Diagnosis not present

## 2015-12-07 DIAGNOSIS — I959 Hypotension, unspecified: Secondary | ICD-10-CM | POA: Diagnosis not present

## 2015-12-07 DIAGNOSIS — R188 Other ascites: Secondary | ICD-10-CM

## 2015-12-07 DIAGNOSIS — M7989 Other specified soft tissue disorders: Secondary | ICD-10-CM | POA: Insufficient documentation

## 2015-12-07 DIAGNOSIS — J449 Chronic obstructive pulmonary disease, unspecified: Secondary | ICD-10-CM | POA: Diagnosis not present

## 2015-12-07 DIAGNOSIS — R7989 Other specified abnormal findings of blood chemistry: Secondary | ICD-10-CM | POA: Diagnosis not present

## 2015-12-07 DIAGNOSIS — R161 Splenomegaly, not elsewhere classified: Secondary | ICD-10-CM

## 2015-12-07 DIAGNOSIS — R0602 Shortness of breath: Secondary | ICD-10-CM

## 2015-12-07 DIAGNOSIS — C801 Malignant (primary) neoplasm, unspecified: Secondary | ICD-10-CM

## 2015-12-07 DIAGNOSIS — C3412 Malignant neoplasm of upper lobe, left bronchus or lung: Secondary | ICD-10-CM

## 2015-12-07 DIAGNOSIS — I10 Essential (primary) hypertension: Secondary | ICD-10-CM | POA: Diagnosis not present

## 2015-12-07 DIAGNOSIS — I509 Heart failure, unspecified: Secondary | ICD-10-CM | POA: Diagnosis not present

## 2015-12-07 DIAGNOSIS — Z794 Long term (current) use of insulin: Secondary | ICD-10-CM

## 2015-12-07 DIAGNOSIS — R5383 Other fatigue: Secondary | ICD-10-CM | POA: Insufficient documentation

## 2015-12-07 DIAGNOSIS — N289 Disorder of kidney and ureter, unspecified: Secondary | ICD-10-CM | POA: Diagnosis not present

## 2015-12-07 LAB — CBC WITH DIFFERENTIAL/PLATELET
BASOS PCT: 0 %
Basophils Absolute: 0 10*3/uL (ref 0–0.1)
EOS ABS: 0 10*3/uL (ref 0–0.7)
EOS PCT: 2 %
HEMATOCRIT: 33.2 % — AB (ref 40.0–52.0)
Hemoglobin: 11 g/dL — ABNORMAL LOW (ref 13.0–18.0)
Lymphocytes Relative: 7 %
Lymphs Abs: 0.2 10*3/uL — ABNORMAL LOW (ref 1.0–3.6)
MCH: 31.8 pg (ref 26.0–34.0)
MCHC: 33.2 g/dL (ref 32.0–36.0)
MCV: 95.7 fL (ref 80.0–100.0)
MONO ABS: 0.4 10*3/uL (ref 0.2–1.0)
MONOS PCT: 17 %
NEUTROS ABS: 1.6 10*3/uL (ref 1.4–6.5)
Neutrophils Relative %: 74 %
PLATELETS: 63 10*3/uL — AB (ref 150–440)
RBC: 3.47 MIL/uL — ABNORMAL LOW (ref 4.40–5.90)
RDW: 19.2 % — AB (ref 11.5–14.5)
WBC: 2.2 10*3/uL — ABNORMAL LOW (ref 3.8–10.6)

## 2015-12-07 LAB — COMPREHENSIVE METABOLIC PANEL
ALBUMIN: 3.2 g/dL — AB (ref 3.5–5.0)
ALT: 14 U/L — ABNORMAL LOW (ref 17–63)
ANION GAP: 8 (ref 5–15)
AST: 30 U/L (ref 15–41)
Alkaline Phosphatase: 89 U/L (ref 38–126)
BILIRUBIN TOTAL: 0.9 mg/dL (ref 0.3–1.2)
BUN: 28 mg/dL — ABNORMAL HIGH (ref 6–20)
CO2: 20 mmol/L — ABNORMAL LOW (ref 22–32)
Calcium: 8.9 mg/dL (ref 8.9–10.3)
Chloride: 93 mmol/L — ABNORMAL LOW (ref 101–111)
Creatinine, Ser: 1.33 mg/dL — ABNORMAL HIGH (ref 0.61–1.24)
GFR calc Af Amer: >60 mL/min (ref >60)
GFR calc non Af Amer: 53 mL/min — ABNORMAL LOW (ref >60)
GLUCOSE: 154 mg/dL — AB (ref 65–99)
POTASSIUM: 5.3 mmol/L — AB (ref 3.5–5.1)
Sodium: 121 mmol/L — ABNORMAL LOW (ref 135–145)
TOTAL PROTEIN: 7.2 g/dL (ref 6.5–8.1)

## 2015-12-07 MED ORDER — HEPARIN SOD (PORK) LOCK FLUSH 100 UNIT/ML IV SOLN
500.0000 [IU] | INTRAVENOUS | Status: AC | PRN
  Administered 2015-12-07: 500 [IU]
  Filled 2015-12-07: qty 5

## 2015-12-07 MED ORDER — SODIUM CHLORIDE 0.9% FLUSH
10.0000 mL | INTRAVENOUS | Status: AC | PRN
  Administered 2015-12-07: 10 mL
  Filled 2015-12-07: qty 10

## 2015-12-07 NOTE — Progress Notes (Signed)
Patient is here for follow up of results. He has fluid build up in abdomen

## 2015-12-07 NOTE — Progress Notes (Signed)
[facility] OFFICE PROGRESS NOTE  Patient Care Team: Derinda Late, MD as PCP - General (Family Medicine)   SUMMARY OF ONCOLOGIC HISTORY:  Oncology History   The patient was diagnosed with a limited stage small-cell lung cancer. He received 4 cycles of carboplatin and etoposide from 08/24/2013 until 11/07/2013. Review of his pre-treatment labs notes that he had a baseline low platelet count, approximately 100,000. Followup PET scan on 01/25/2014 revealed a complete remission. 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 He then received prophylactic cranial radiation from 01/28 until 03/16/2014. According to the patient, he has been on a tapering dose of steroids per Dr. Baruch Gouty of radiation oncology.    # July 2015- SMALL CELL LUL [limited stage] s/p RT-Carbo-Etop x 4; JAN 2017- CT-Stable LUL mass/scar; May 2017- STABLE radiation changes; bone scan-NEG  # DEC 2016- Right LLL [~1.6cm]- likely nodular atelectasis; MAY 2017- CT- RLL-rounded atelectasis/small effusion; NOV CT CHEST- NED.  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  Patient notes to have increasing swelling of the abdomen. Also continues to have chronic swelling in the legs. Mild shortness of breath. Not any worse. Chronic swelling in the legs. Easy bruising. Not any worse. He continues  to feel overall poorly- given extreme fatigue and also forgetfulness.  REVIEW OF SYSTEMS:  A complete 10 point review of system is done which is negative except mentioned above/history of present illness.   PAST MEDICAL HISTORY :  Past Medical History:  Diagnosis Date  . Ascites   . Azotemia   . Cataracts, both eyes   . CHF (congestive heart failure) ([place])   . Cirrhosis ([place])   . COPD (chronic obstructive pulmonary disease) ([place])   . Detached retina   . Diabetes ([place])   . GI bleed   . History of chemotherapy    finished July 2015-4 cycles of carbo/taxol  . HTN (hypertension)   . Hyperlipidemia   . IDA (iron deficiency anemia)   . Nephrotic range proteinuria   . Pleural effusion   . Renal insufficiency   . Small cell carcinoma of lung ([place]) 07/27/2014  . Small cell lung cancer ([place])   . Splenomegaly   . Thrombocytopenia ([place])     PAST SURGICAL HISTORY :   Past Surgical History:  Procedure Laterality Date  . CARDIAC STENTS    . COLONOSCOPY  12/18/12  . ESOPHAGOGASTRODUODENOSCOPY (EGD) WITH PROPOFOL N/A 05/11/2015   Procedure: ESOPHAGOGASTRODUODENOSCOPY (EGD) WITH PROPOFOL;  Surgeon: Hulen Luster, MD;  Location: [facility] ENDOSCOPY;  Service: Endoscopy;  Laterality: N/A;  . FISTULA REPAIR    . LAMINECTOMY      FAMILY HISTORY :   Family History  Problem Relation Age of Onset  . Heart attack Mother   . Heart attack Father     SOCIAL HISTORY:   Social History  Substance  Use Topics  . Smoking status: Former Research scientist (life sciences)  . Smokeless tobacco: Not on file  . Alcohol use No    ALLERGIES:  is allergic to tape.  MEDICATIONS:  Current Outpatient Prescriptions  Medication Sig Dispense Refill  . allopurinol (ZYLOPRIM) 300 MG tablet Take 300 mg by mouth daily.    . ferrous fumarate-iron polysaccharide complex (TANDEM) 162-115.2 MG CAPS capsule Take 1 capsule by mouth daily with breakfast. 30 capsule 12  . furosemide (LASIX) 40 MG tablet Take by mouth.    . GENERLAC 10 GM/15ML SOLN TK  30 ML PO BID  3  . Insulin Human (INSULIN PUMP) SOLN Pt uses Humalog insulin.    Marland Kitchen lactulose (CHRONULAC) 10 GM/15ML solution Take 30 g by mouth 2 (two) times daily as needed for mild constipation.    . midodrine (PROAMATINE) 5 MG tablet Take 5 mg by mouth 3 (three) times daily with meals.    . Multiple Vitamin (MULTIVITAMIN WITH MINERALS) TABS tablet Take 1 tablet by mouth daily.    . nitroGLYCERIN (NITRODUR - DOSED IN MG/24 HR) 0.4 mg/hr patch Place 0.4 mg onto the skin daily as needed (for chest pain).     . nitroGLYCERIN (NITROSTAT) 0.4 MG SL tablet Place 0.4 mg under the tongue every 5 (five) minutes as needed for chest pain.    . pantoprazole (PROTONIX) 40 MG tablet Take 1 tablet (40 mg total) by mouth 2 (two) times daily before a meal. (Patient taking differently: Take 40 mg by mouth daily. ) 60 tablet 0  . polyethylene glycol (MIRALAX / GLYCOLAX) packet Take 17 g by mouth daily as needed for mild constipation.    . rifaximin (XIFAXAN) 550 MG TABS tablet Take by mouth.    . spironolactone (ALDACTONE) 100 MG tablet Take 50 mg by mouth once.     . sucralfate (CARAFATE) 1 g tablet Take 1 tablet (1 g total) by mouth 4 (four) times daily. (Patient taking differently: Take 1 g by mouth 2 (two) times daily. ) 120 tablet 0  . tamsulosin (FLOMAX) 0.4 MG CAPS capsule Take 0.4 mg by mouth daily after breakfast.      No current facility-administered medications for this visit.    Facility-Administered Medications Ordered in Other Visits  Medication Dose Route Frequency Provider Last Rate Last Dose  . sodium chloride 0.9 % injection 10 mL  10 mL Intravenous PRN Evlyn Kanner, NP   10 mL at 07/14/14 1125    PHYSICAL EXAMINATION: ECOG PERFORMANCE STATUS: 3 - Symptomatic, >50% confined to bed  BP 135/77 (BP Location: Left Arm)   Pulse (!) 59   Temp 97.2 F (36.2 C) (Tympanic)   Resp 18   Wt 242 lb (109.8 kg)   SpO2 100%   BMI 31.07 kg/m   Filed Weights   12/07/15 1152  Weight: 242 lb  (109.8 kg)    GENERAL: Well-nourished well-developed; Alert, no distress and comfortable.  Accompanied by wife-multiple family members/ His wheelchair. Appears Weak. EYES: Positive for pallor. Positive for mild icterus. OROPHARYNX: no thrush or ulceration; poor dentition  NECK: supple, no masses felt LYMPH:  no palpable lymphadenopathy in the cervical, axillary or inguinal regions LUNGS: Decreased breath sounds at the bases. No wheeze or crackles HEART/CVS: regular rate & rhythm and no murmurs; 2+ bilateral lower extremity edema ABDOMEN:abdomen soft, non-tender and normal bowel sounds; positive for splenomegaly. Musculoskeletal:no cyanosis of digits and no clubbing  PSYCH: alert & oriented x 3 with fluent speech NEURO: no focal motor/sensory deficits  SKIN:  Multiple ecchymosis noted on the extremities/ chronic.  LABORATORY DATA:  I have reviewed the data as listed    Component Value Date/Time   NA 121 (L) 12/07/2015 1118   NA 126 (L) 05/03/2014 1112   K 5.3 (H) 12/07/2015 1118   K 4.8 05/03/2014 1112   CL 93 (L) 12/07/2015 1118   CL 97 (L) 05/03/2014 1112   CO2 20 (L) 12/07/2015 1118   CO2 22 05/03/2014 1112   GLUCOSE 154 (H) 12/07/2015 1118   GLUCOSE 93 05/03/2014 1112   BUN 28 (H) 12/07/2015 1118   BUN 30 (H) 05/03/2014 1112   CREATININE 1.33 (H) 12/07/2015 1118   CREATININE 1.55 (H) 05/03/2014 1112   CALCIUM 8.9 12/07/2015 1118   CALCIUM 8.8 (L) 05/03/2014 1112   PROT 7.2 12/07/2015 1118   PROT 7.2 05/03/2014 1112   ALBUMIN 3.2 (L) 12/07/2015 1118   ALBUMIN 3.5 05/03/2014 1112   AST 30 12/07/2015 1118   AST 27 05/03/2014 1112   ALT 14 (L) 12/07/2015 1118   ALT 14 (L) 05/03/2014 1112   ALKPHOS 89 12/07/2015 1118   ALKPHOS 105 05/03/2014 1112   BILITOT 0.9 12/07/2015 1118   BILITOT 0.9 05/03/2014 1112   GFRNONAA 53 (L) 12/07/2015 1118   GFRNONAA 46 (L) 05/03/2014 1112   GFRAA >60 12/07/2015 1118   GFRAA 53 (L) 05/03/2014 1112    No results found for: SPEP,  UPEP  Lab Results  Component Value Date   WBC 2.2 (L) 12/07/2015   NEUTROABS 1.6 12/07/2015   HGB 11.0 (L) 12/07/2015   HCT 33.2 (L) 12/07/2015   MCV 95.7 12/07/2015   PLT 63 (L) 12/07/2015      Chemistry      Component Value Date/Time   NA 121 (L) 12/07/2015 1118   NA 126 (L) 05/03/2014 1112   K 5.3 (H) 12/07/2015 1118   K 4.8 05/03/2014 1112   CL 93 (L) 12/07/2015 1118   CL 97 (L) 05/03/2014 1112   CO2 20 (L) 12/07/2015 1118   CO2 22 05/03/2014 1112   BUN 28 (H) 12/07/2015 1118   BUN 30 (H) 05/03/2014 1112   CREATININE 1.33 (H) 12/07/2015 1118   CREATININE 1.55 (H) 05/03/2014 1112      Component Value Date/Time   CALCIUM 8.9 12/07/2015 1118   CALCIUM 8.8 (L) 05/03/2014 1112   ALKPHOS 89 12/07/2015 1118   ALKPHOS 105 05/03/2014 1112   AST 30 12/07/2015 1118   AST 27 05/03/2014 1112   ALT 14 (L) 12/07/2015 1118   ALT 14 (L) 05/03/2014 1112   BILITOT 0.9 12/07/2015 1118   BILITOT 0.9 05/03/2014 1112        ASSESSMENT & PLAN:   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 OCT 2017 CT scan shows no concerns for any recurrent disease. Stable left lung radiation changes; right base of lung rounded atelectasis and small pleural effusion.  # Chronic bilateral hip pain- April 2017- bone scan negative for metastatic disease.   # Pancytopenia- cirrhosis/splenomegaly. Wbc 2.2/ Hb 11/platelets- 63.   # Decompensated cirrhosis- ascites status post paracentesis.follows closely with GI. Review the records from GI/transplant team at [facility].  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 Discussed regarding getting a MRI of the brain- issues with claustrophobia. I would not recommend CT brain with contrast because of chronic kidney disease.  # Elevated blood abnormalities sodium 121/potassium 5.3- likely secondary  to diuretics. He is on salt restriction. Defer to GI for  further recommendations.  # Patient follow-up with me in approximately 6 months with labs. CT chest/Abdomen/ pelvis noncontrast week prior. Overall poor prognosis.  # 25 minutes face-to-face with the patient discussing the above plan of care; more than 50% of time spent on prognosis/ natural history; counseling and coordination.     Cammie Sickle, MD 12/07/2015 12:47 PM

## 2015-12-07 NOTE — Assessment & Plan Note (Addendum)
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 OCT 2017 CT scan shows no concerns for any recurrent disease. Stable left lung radiation changes; right base of lung rounded atelectasis and small pleural effusion.  # Chronic bilateral hip pain- April 2017- bone scan negative for metastatic disease.   # Pancytopenia- cirrhosis/splenomegaly. Wbc 2.2/ Hb 11/platelets- 63.   # Decompensated cirrhosis- ascites status post paracentesis.follows closely with GI. Review the records from GI/transplant team at [facility].  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 Discussed regarding getting a MRI of the brain- issues with claustrophobia. I would not recommend CT brain with contrast because of chronic kidney disease.  # Elevated blood abnormalities sodium 121/potassium 5.3- likely secondary to diuretics. He is on salt restriction. Defer to GI for further recommendations.  # Patient follow-up with me in approximately 6 months with labs. CT chest/Abdomen/ pelvis noncontrast week prior. Overall poor prognosis.  # 25 minutes face-to-face with the patient discussing the above plan of care; more than 50% of time spent on prognosis/ natural history; counseling and coordination.

## 2015-12-14 ENCOUNTER — Other Ambulatory Visit: Payer: Self-pay | Admitting: Gastroenterology

## 2015-12-14 DIAGNOSIS — R188 Other ascites: Principal | ICD-10-CM

## 2015-12-14 DIAGNOSIS — K7460 Unspecified cirrhosis of liver: Secondary | ICD-10-CM

## 2015-12-17 ENCOUNTER — Other Ambulatory Visit
Admission: RE | Admit: 2015-12-17 | Discharge: 2015-12-17 | Disposition: A | Discharge status: Home / Self Care | Payer: Medicare Other | Source: Ambulatory Visit | Attending: Gastroenterology | Admitting: Gastroenterology

## 2015-12-17 ENCOUNTER — Ambulatory Visit
Admission: RE | Admit: 2015-12-17 | Discharge: 2015-12-17 | Disposition: A | Discharge status: Home / Self Care | Payer: Medicare Other | Source: Ambulatory Visit | Attending: Gastroenterology | Admitting: Gastroenterology

## 2015-12-17 DIAGNOSIS — R188 Other ascites: Secondary | ICD-10-CM | POA: Diagnosis present

## 2015-12-17 DIAGNOSIS — K7460 Unspecified cirrhosis of liver: Secondary | ICD-10-CM | POA: Diagnosis not present

## 2015-12-17 LAB — BODY FLUID CELL COUNT WITH DIFFERENTIAL
Eos, Fluid: 0 %
Lymphs, Fluid: 62 %
Monocyte-Macrophage-Serous Fluid: 35 %
Neutrophil Count, Fluid: 3 %
Other Cells, Fluid: 0 %
WBC FLUID: 194 uL

## 2015-12-17 LAB — PROTEIN, BODY FLUID

## 2015-12-17 LAB — BASIC METABOLIC PANEL
ANION GAP: 7 (ref 5–15)
BUN: 29 mg/dL — AB (ref 6–20)
CHLORIDE: 99 mmol/L — AB (ref 101–111)
CO2: 22 mmol/L (ref 22–32)
Calcium: 8.7 mg/dL — ABNORMAL LOW (ref 8.9–10.3)
Creatinine, Ser: 1.34 mg/dL — ABNORMAL HIGH (ref 0.61–1.24)
GFR calc Af Amer: >60 mL/min (ref >60)
GFR calc non Af Amer: 52 mL/min — ABNORMAL LOW (ref >60)
Glucose, Bld: 108 mg/dL — ABNORMAL HIGH (ref 65–99)
POTASSIUM: 4.7 mmol/L (ref 3.5–5.1)
Sodium: 128 mmol/L — ABNORMAL LOW (ref 135–145)

## 2015-12-17 LAB — ALBUMIN, FLUID (OTHER): ALBUMIN FL: 1.3 g/dL

## 2015-12-17 LAB — PROTIME-INR
INR: 1.24
PROTHROMBIN TIME: 15.7 s — AB (ref 11.4–15.2)

## 2015-12-17 NOTE — Procedures (Signed)
Successful US guided paracentesis yielding 5 L of serous ascitic fluid. (Note, paracentesis was stopped at 5L per pt request) Sample sent to laboratory as requested. EBL: None No immediate post procedural complications.   Ronny Bacon, MD Pager #: [phone]

## 2015-12-18 LAB — CYTOLOGY - NON PAP

## 2015-12-18 LAB — MISC LABCORP TEST (SEND OUT): LABCORP TEST CODE: 19588

## 2015-12-20 LAB — BODY FLUID CULTURE: CULTURE: NO GROWTH

## 2016-01-08 ENCOUNTER — Inpatient Hospital Stay: Payer: Medicare Other | Attending: Internal Medicine

## 2016-01-08 DIAGNOSIS — Z85118 Personal history of other malignant neoplasm of bronchus and lung: Secondary | ICD-10-CM | POA: Diagnosis present

## 2016-01-08 DIAGNOSIS — Z452 Encounter for adjustment and management of vascular access device: Secondary | ICD-10-CM | POA: Diagnosis not present

## 2016-01-08 DIAGNOSIS — C3492 Malignant neoplasm of unspecified part of left bronchus or lung: Secondary | ICD-10-CM

## 2016-01-08 MED ORDER — HEPARIN SOD (PORK) LOCK FLUSH 100 UNIT/ML IV SOLN
INTRAVENOUS | Status: AC
  Filled 2016-01-08: qty 5

## 2016-01-08 MED ORDER — SODIUM CHLORIDE 0.9% FLUSH
10.0000 mL | Freq: Once | INTRAVENOUS | Status: AC
  Administered 2016-01-08: 10 mL via INTRAVENOUS
  Filled 2016-01-08: qty 10

## 2016-01-08 MED ORDER — HEPARIN SOD (PORK) LOCK FLUSH 100 UNIT/ML IV SOLN
500.0000 [IU] | Freq: Once | INTRAVENOUS | Status: AC
  Administered 2016-01-08: 500 [IU] via INTRAVENOUS

## 2016-01-10 ENCOUNTER — Inpatient Hospital Stay: Payer: Medicare Other

## 2016-01-14 ENCOUNTER — Other Ambulatory Visit: Payer: Self-pay | Admitting: Gastroenterology

## 2016-01-14 ENCOUNTER — Other Ambulatory Visit (HOSPITAL_COMMUNITY): Payer: Self-pay | Admitting: Gastroenterology

## 2016-01-14 DIAGNOSIS — R188 Other ascites: Secondary | ICD-10-CM

## 2016-01-15 NOTE — Discharge Instructions (Signed)
Paracentesis Introduction Paracentesis is a procedure to remove excess fluid (ascites) from the belly (abdomen). Ascites can result from certain conditions, such as infection, inflammation, abdominal injury, heart failure, chronic scarring of the liver (cirrhosis), or cancer. Ascites is removed using a needle that is inserted through the skin and tissue into the abdomen. This procedure may be done:  To determine the cause of the ascites.  To relieve symptoms that are caused by the ascites, such as pain or shortness of breath.  To see if there is bleeding after an abdominal injury. Tell a health care provider about:  Any allergies you have.  All medicines you are taking, including vitamins, herbs, eye drops, creams, and over-the-counter medicines.  Any problems you or family members have had with anesthetic medicines.  Any blood disorders you have.  Any surgeries you have had.  Any medical conditions you have.  Whether you are pregnant or may be pregnant. What are the risks? Generally, this is a safe procedure. However, problems may occur, including:  Infection.  Bleeding.  Injury to an abdominal organ, such as the bowel (large intestine), liver, spleen, or bladder.  Low blood pressure (hypotension).  Spreading of cancer, if there are cancer cells in the abdominal fluid.  Mental status changes in people who have liver disease. These changes would be caused by shifts in the balance of fluids and minerals (electrolytes) in the body. What happens before the procedure?  Ask your health care provider about:  Changing or stopping your regular medicines. This is especially important if you are taking diabetes medicines or blood thinners.  Taking medicines such as aspirin and ibuprofen. These medicines can thin your blood. Do not take these medicines before your procedure if your health care provider instructs you not to.  A blood sample may be done to determine your blood  clotting time.  You will be asked to urinate. What happens during the procedure?  You may be asked to lie on your back with your head raised (elevated).  To reduce your risk of infection:  Your health care team will wash or sanitize their hands.  Your skin will be washed with soap.  You will be given a medicine to numb the area (local anesthetic).  Your abdominal skin will be punctured with a needle or a scalpel.  A drainage tube will be inserted through the puncture site. Fluid will drain through the tube into a container.  After enough fluid has been removed, the tube will be removed.  A sample of the fluid will be sent for examination.  A bandage (dressing) will be placed over the puncture site. The procedure may vary among health care providers and hospitals. What happens after the procedure?  It is your responsibility to get your test results. Ask your health care provider or the department performing the test when your results will be ready. This information is not intended to replace advice given to you by your health care provider. Make sure you discuss any questions you have with your health care provider. Document Released: 07/29/2004 Document Revised: 06/21/2015 Document Reviewed: 03/28/2014  2017 Elsevier

## 2016-01-16 ENCOUNTER — Ambulatory Visit (HOSPITAL_COMMUNITY): Payer: Medicare Other

## 2016-01-16 MED ORDER — ALBUMIN HUMAN 25 % IV SOLN
50.0000 g | Freq: Once | INTRAVENOUS | Status: AC
  Administered 2016-01-17 (×2): 25 g via INTRAVENOUS
  Filled 2016-01-16: qty 200

## 2016-01-17 ENCOUNTER — Ambulatory Visit
Admission: RE | Admit: 2016-01-17 | Discharge: 2016-01-17 | Disposition: A | Discharge status: Home / Self Care | Payer: Medicare Other | Source: Ambulatory Visit | Attending: Gastroenterology | Admitting: Gastroenterology

## 2016-01-17 DIAGNOSIS — R188 Other ascites: Secondary | ICD-10-CM | POA: Diagnosis not present

## 2016-01-17 NOTE — Procedures (Signed)
Successful US guided paracentesis.  Removed 5 liters of yellow fluid without complication.  No immediate complication.

## 2016-02-19 ENCOUNTER — Inpatient Hospital Stay: Payer: Medicare Other | Attending: Internal Medicine

## 2016-02-19 DIAGNOSIS — Z85118 Personal history of other malignant neoplasm of bronchus and lung: Secondary | ICD-10-CM | POA: Diagnosis not present

## 2016-02-19 DIAGNOSIS — Z452 Encounter for adjustment and management of vascular access device: Secondary | ICD-10-CM | POA: Diagnosis not present

## 2016-02-19 DIAGNOSIS — Z95828 Presence of other vascular implants and grafts: Secondary | ICD-10-CM

## 2016-02-19 MED ORDER — HEPARIN SOD (PORK) LOCK FLUSH 100 UNIT/ML IV SOLN
500.0000 [IU] | Freq: Once | INTRAVENOUS | Status: AC
  Administered 2016-02-19: 500 [IU] via INTRAVENOUS

## 2016-02-19 MED ORDER — SODIUM CHLORIDE 0.9% FLUSH
10.0000 mL | INTRAVENOUS | Status: DC | PRN
  Administered 2016-02-19: 10 mL via INTRAVENOUS
  Filled 2016-02-19: qty 10

## 2016-02-25 ENCOUNTER — Other Ambulatory Visit: Payer: Self-pay | Admitting: Physician Assistant

## 2016-02-25 DIAGNOSIS — Z5189 Encounter for other specified aftercare: Secondary | ICD-10-CM

## 2016-02-26 ENCOUNTER — Ambulatory Visit
Admission: RE | Admit: 2016-02-26 | Discharge: 2016-02-26 | Disposition: A | Discharge status: Home / Self Care | Payer: Medicare Other | Source: Ambulatory Visit | Attending: Family Medicine | Admitting: Family Medicine

## 2016-02-26 ENCOUNTER — Other Ambulatory Visit: Payer: Self-pay | Admitting: Gastroenterology

## 2016-02-26 DIAGNOSIS — R140 Abdominal distension (gaseous): Secondary | ICD-10-CM

## 2016-02-26 DIAGNOSIS — R188 Other ascites: Secondary | ICD-10-CM | POA: Diagnosis not present

## 2016-02-26 MED ORDER — ALBUMIN HUMAN 25 % IV SOLN
25.0000 g | INTRAVENOUS | Status: AC
  Administered 2016-02-26 (×2): 25 g via INTRAVENOUS
  Filled 2016-02-26 (×2): qty 100

## 2016-02-26 NOTE — Procedures (Signed)
US guided paracentesis.  Removed 5 liters of yellow fluid.  No immediate complication.  See full report in Imaging.

## 2016-02-27 ENCOUNTER — Ambulatory Visit: Payer: Medicare Other

## 2016-03-03 ENCOUNTER — Ambulatory Visit: Admission: RE | Admit: 2016-03-03 | Payer: Medicare Other | Source: Ambulatory Visit

## 2016-03-03 ENCOUNTER — Other Ambulatory Visit: Payer: Self-pay | Admitting: Physician Assistant

## 2016-03-03 DIAGNOSIS — K7460 Unspecified cirrhosis of liver: Secondary | ICD-10-CM

## 2016-03-03 DIAGNOSIS — R188 Other ascites: Principal | ICD-10-CM

## 2016-03-07 ENCOUNTER — Ambulatory Visit
Admission: RE | Admit: 2016-03-07 | Discharge: 2016-03-07 | Disposition: A | Discharge status: Home / Self Care | Payer: Medicare Other | Source: Ambulatory Visit | Attending: Physician Assistant | Admitting: Physician Assistant

## 2016-03-07 DIAGNOSIS — K7460 Unspecified cirrhosis of liver: Secondary | ICD-10-CM | POA: Diagnosis not present

## 2016-03-07 DIAGNOSIS — R188 Other ascites: Secondary | ICD-10-CM | POA: Diagnosis not present

## 2016-03-07 DIAGNOSIS — Z5189 Encounter for other specified aftercare: Secondary | ICD-10-CM

## 2016-03-07 MED ORDER — ALBUMIN HUMAN 25 % IV SOLN
50.0000 g | Freq: Once | INTRAVENOUS | Status: AC
  Administered 2016-03-07: 50 g via INTRAVENOUS
  Filled 2016-03-07: qty 200

## 2016-03-07 NOTE — Procedures (Signed)
US guided paracentesis.  Fluid removed from left lower quadrant.  5 liters of yellow fluid removed.  No immediate complication.

## 2016-03-10 ENCOUNTER — Ambulatory Visit: Payer: Medicare Other

## 2016-03-14 ENCOUNTER — Other Ambulatory Visit
Admission: RE | Admit: 2016-03-14 | Discharge: 2016-03-14 | Disposition: A | Discharge status: Home / Self Care | Payer: Medicare Other | Source: Ambulatory Visit | Attending: Physician Assistant | Admitting: Physician Assistant

## 2016-03-14 ENCOUNTER — Ambulatory Visit
Admission: RE | Admit: 2016-03-14 | Discharge: 2016-03-14 | Disposition: A | Discharge status: Home / Self Care | Payer: Medicare Other | Source: Ambulatory Visit | Attending: Physician Assistant | Admitting: Physician Assistant

## 2016-03-14 DIAGNOSIS — R188 Other ascites: Secondary | ICD-10-CM | POA: Insufficient documentation

## 2016-03-14 DIAGNOSIS — K7460 Unspecified cirrhosis of liver: Secondary | ICD-10-CM | POA: Diagnosis not present

## 2016-03-14 LAB — CBC
HEMATOCRIT: 31.1 % — AB (ref 40.0–52.0)
Hemoglobin: 10.5 g/dL — ABNORMAL LOW (ref 13.0–18.0)
MCH: 32.3 pg (ref 26.0–34.0)
MCHC: 33.9 g/dL (ref 32.0–36.0)
MCV: 95.5 fL (ref 80.0–100.0)
PLATELETS: 71 10*3/uL — AB (ref 150–440)
RBC: 3.26 MIL/uL — ABNORMAL LOW (ref 4.40–5.90)
RDW: 20.4 % — AB (ref 11.5–14.5)
WBC: 1.6 10*3/uL — ABNORMAL LOW (ref 3.8–10.6)

## 2016-03-14 LAB — COMPREHENSIVE METABOLIC PANEL
ALT: 28 U/L (ref 17–63)
ANION GAP: 6 (ref 5–15)
AST: 54 U/L — ABNORMAL HIGH (ref 15–41)
Albumin: 3.3 g/dL — ABNORMAL LOW (ref 3.5–5.0)
Alkaline Phosphatase: 136 U/L — ABNORMAL HIGH (ref 38–126)
BILIRUBIN TOTAL: 0.5 mg/dL (ref 0.3–1.2)
BUN: 34 mg/dL — ABNORMAL HIGH (ref 6–20)
CO2: 20 mmol/L — ABNORMAL LOW (ref 22–32)
Calcium: 8.5 mg/dL — ABNORMAL LOW (ref 8.9–10.3)
Chloride: 103 mmol/L (ref 101–111)
Creatinine, Ser: 1.69 mg/dL — ABNORMAL HIGH (ref 0.61–1.24)
GFR, EST AFRICAN AMERICAN: 46 mL/min — AB (ref >60)
GFR, EST NON AFRICAN AMERICAN: 40 mL/min — AB (ref >60)
Glucose, Bld: 168 mg/dL — ABNORMAL HIGH (ref 65–99)
POTASSIUM: 4.8 mmol/L (ref 3.5–5.1)
Sodium: 129 mmol/L — ABNORMAL LOW (ref 135–145)
TOTAL PROTEIN: 6.8 g/dL (ref 6.5–8.1)

## 2016-03-14 MED ORDER — ALBUMIN HUMAN 25 % IV SOLN
50.0000 g | Freq: Once | INTRAVENOUS | Status: AC
  Administered 2016-03-14: 50 g via INTRAVENOUS
  Filled 2016-03-14: qty 200

## 2016-03-14 NOTE — Procedures (Signed)
US guided paracentesis.  Catheter placed in LLQ.  Removed 6 liters of yellow fluid.  No immediate complication.

## 2016-03-21 ENCOUNTER — Ambulatory Visit
Admission: RE | Admit: 2016-03-21 | Discharge: 2016-03-21 | Disposition: A | Discharge status: Home / Self Care | Payer: Medicare Other | Source: Ambulatory Visit | Attending: Physician Assistant | Admitting: Physician Assistant

## 2016-03-21 DIAGNOSIS — R188 Other ascites: Secondary | ICD-10-CM | POA: Diagnosis present

## 2016-03-21 DIAGNOSIS — K7460 Unspecified cirrhosis of liver: Secondary | ICD-10-CM | POA: Insufficient documentation

## 2016-03-21 MED ORDER — ALBUMIN HUMAN 25 % IV SOLN
50.0000 g | Freq: Once | INTRAVENOUS | Status: AC
  Administered 2016-03-21: 50 g via INTRAVENOUS
  Filled 2016-03-21: qty 200

## 2016-03-21 NOTE — Procedures (Signed)
Successful US guided paracentesis yielding 9.9 L of serous ascitic fluid. EBL: None No immediate post procedural complications.   Ronny Bacon, MD Pager #: [phone]

## 2016-03-25 NOTE — Discharge Instructions (Signed)
Paracentesis, Care After  Refer to this sheet in the next few weeks. These instructions provide you with information about caring for yourself after your procedure. Your health care provider may also give you more specific instructions. Your treatment has been planned according to current medical practices, but problems sometimes occur. Call your health care provider if you have any problems or questions after your procedure.  What can I expect after the procedure?  After your procedure, it is common to have a small amount of clear fluid coming from the puncture site.  Follow these instructions at home:  · Return to your normal activities as told by your health care provider. Ask your health care provider what activities are safe for you.  · Take over-the-counter and prescription medicines only as told by your health care provider.  · Do not take baths, swim, or use a hot tub until your health care provider approves.  · Follow instructions from your health care provider about:  ? How to take care of your puncture site.  ? When and how you should change your bandage (dressing).  ? When you should remove your dressing.  · Check your puncture area every day signs of infection. Watch for:  ? Redness, swelling, or pain.  ? Fluid, blood, or pus.  · Keep all follow-up visits as told by your health care provider. This is important.  Contact a health care provider if:  · You have redness, swelling, or pain at your puncture site.  · You start to have more clear fluid coming from your puncture site.  · You have blood or pus coming from your puncture site.  · You have chills.  · You have a fever.  Get help right away if:  · You develop chest pain or shortness of breath.  · You develop increasing pain, discomfort, or swelling in your abdomen.  · You feel dizzy or light-headed or you pass out.  This information is not intended to replace advice given to you by your health care provider. Make sure you discuss any questions you  have with your health care provider.  Document Released: 05/30/2014 Document Revised: 06/21/2015 Document Reviewed: 03/28/2014  Elsevier Interactive Patient Education © 2017 [employer]

## 2016-03-28 ENCOUNTER — Ambulatory Visit
Admission: RE | Admit: 2016-03-28 | Discharge: 2016-03-28 | Disposition: A | Discharge status: Home / Self Care | Payer: Medicare Other | Source: Ambulatory Visit | Attending: Physician Assistant | Admitting: Physician Assistant

## 2016-03-28 DIAGNOSIS — K7460 Unspecified cirrhosis of liver: Secondary | ICD-10-CM | POA: Diagnosis not present

## 2016-03-28 DIAGNOSIS — R188 Other ascites: Secondary | ICD-10-CM | POA: Insufficient documentation

## 2016-03-28 MED ORDER — ALBUMIN HUMAN 25 % IV SOLN
50.0000 g | Freq: Once | INTRAVENOUS | Status: AC
  Administered 2016-03-28: 50 g via INTRAVENOUS
  Filled 2016-03-28: qty 200

## 2016-03-28 NOTE — Procedures (Signed)
US guided paracentesis.  Removed 10.5 liters of fluid.  No immediate complication.  See full report in Imaging.

## 2016-04-01 ENCOUNTER — Inpatient Hospital Stay: Payer: Medicare Other | Attending: Internal Medicine

## 2016-04-01 DIAGNOSIS — Z95828 Presence of other vascular implants and grafts: Secondary | ICD-10-CM

## 2016-04-01 DIAGNOSIS — Z85118 Personal history of other malignant neoplasm of bronchus and lung: Secondary | ICD-10-CM | POA: Insufficient documentation

## 2016-04-01 DIAGNOSIS — Z452 Encounter for adjustment and management of vascular access device: Secondary | ICD-10-CM | POA: Insufficient documentation

## 2016-04-01 MED ORDER — SODIUM CHLORIDE 0.9% FLUSH
10.0000 mL | INTRAVENOUS | Status: DC | PRN
  Administered 2016-04-01: 10 mL via INTRAVENOUS
  Filled 2016-04-01: qty 10

## 2016-04-01 MED ORDER — HEPARIN SOD (PORK) LOCK FLUSH 100 UNIT/ML IV SOLN
500.0000 [IU] | Freq: Once | INTRAVENOUS | Status: AC
  Administered 2016-04-01: 500 [IU] via INTRAVENOUS

## 2016-04-03 ENCOUNTER — Other Ambulatory Visit: Payer: Self-pay | Admitting: Physician Assistant

## 2016-04-03 DIAGNOSIS — K7460 Unspecified cirrhosis of liver: Secondary | ICD-10-CM

## 2016-04-03 DIAGNOSIS — R188 Other ascites: Principal | ICD-10-CM

## 2016-04-08 ENCOUNTER — Ambulatory Visit: Admission: RE | Admit: 2016-04-08 | Payer: Medicare Other | Source: Ambulatory Visit

## 2016-04-08 ENCOUNTER — Other Ambulatory Visit: Payer: Self-pay | Admitting: Physician Assistant

## 2016-04-08 DIAGNOSIS — K7460 Unspecified cirrhosis of liver: Secondary | ICD-10-CM

## 2016-04-08 DIAGNOSIS — R188 Other ascites: Principal | ICD-10-CM

## 2016-04-15 ENCOUNTER — Other Ambulatory Visit
Admission: RE | Admit: 2016-04-15 | Discharge: 2016-04-15 | Disposition: A | Discharge status: Home / Self Care | Payer: Medicare Other | Source: Ambulatory Visit | Attending: Physician Assistant | Admitting: Physician Assistant

## 2016-04-15 ENCOUNTER — Ambulatory Visit
Admission: RE | Admit: 2016-04-15 | Discharge: 2016-04-15 | Disposition: A | Discharge status: Home / Self Care | Payer: Medicare Other | Source: Ambulatory Visit | Attending: Physician Assistant | Admitting: Physician Assistant

## 2016-04-15 ENCOUNTER — Other Ambulatory Visit: Payer: Self-pay | Admitting: Physician Assistant

## 2016-04-15 DIAGNOSIS — K7460 Unspecified cirrhosis of liver: Secondary | ICD-10-CM | POA: Diagnosis not present

## 2016-04-15 DIAGNOSIS — R188 Other ascites: Secondary | ICD-10-CM | POA: Diagnosis not present

## 2016-04-15 LAB — BASIC METABOLIC PANEL
ANION GAP: 6 (ref 5–15)
BUN: 27 mg/dL — ABNORMAL HIGH (ref 6–20)
CO2: 20 mmol/L — AB (ref 22–32)
Calcium: 8.7 mg/dL — ABNORMAL LOW (ref 8.9–10.3)
Chloride: 99 mmol/L — ABNORMAL LOW (ref 101–111)
Creatinine, Ser: 1.46 mg/dL — ABNORMAL HIGH (ref 0.61–1.24)
GFR calc Af Amer: 55 mL/min — ABNORMAL LOW (ref >60)
GFR calc non Af Amer: 47 mL/min — ABNORMAL LOW (ref >60)
GLUCOSE: 75 mg/dL (ref 65–99)
POTASSIUM: 4.3 mmol/L (ref 3.5–5.1)
Sodium: 125 mmol/L — ABNORMAL LOW (ref 135–145)

## 2016-04-15 MED ORDER — ALBUMIN HUMAN 25 % IV SOLN
25.0000 g | Freq: Once | INTRAVENOUS | Status: AC
  Administered 2016-04-15: 25 g via INTRAVENOUS
  Filled 2016-04-15: qty 100

## 2016-04-22 ENCOUNTER — Ambulatory Visit
Admission: RE | Admit: 2016-04-22 | Discharge: 2016-04-22 | Disposition: A | Discharge status: Home / Self Care | Payer: Medicare Other | Source: Ambulatory Visit | Attending: Physician Assistant | Admitting: Physician Assistant

## 2016-04-22 DIAGNOSIS — K7460 Unspecified cirrhosis of liver: Secondary | ICD-10-CM | POA: Diagnosis not present

## 2016-04-22 DIAGNOSIS — R188 Other ascites: Secondary | ICD-10-CM | POA: Diagnosis not present

## 2016-04-22 MED ORDER — ALBUMIN HUMAN 25 % IV SOLN
25.0000 g | Freq: Once | INTRAVENOUS | Status: AC
  Administered 2016-04-22: 25 g via INTRAVENOUS
  Filled 2016-04-22: qty 100

## 2016-04-28 ENCOUNTER — Other Ambulatory Visit: Payer: Self-pay | Admitting: Physician Assistant

## 2016-04-28 ENCOUNTER — Other Ambulatory Visit: Payer: Self-pay | Admitting: Internal Medicine

## 2016-04-28 DIAGNOSIS — R188 Other ascites: Secondary | ICD-10-CM

## 2016-04-29 ENCOUNTER — Ambulatory Visit
Admission: RE | Admit: 2016-04-29 | Discharge: 2016-04-29 | Disposition: A | Discharge status: Home / Self Care | Payer: Medicare Other | Source: Ambulatory Visit | Attending: Physician Assistant | Admitting: Physician Assistant

## 2016-04-29 DIAGNOSIS — K7460 Unspecified cirrhosis of liver: Secondary | ICD-10-CM | POA: Diagnosis present

## 2016-04-29 DIAGNOSIS — R188 Other ascites: Secondary | ICD-10-CM | POA: Diagnosis not present

## 2016-04-29 MED ORDER — ALBUMIN HUMAN 25 % IV SOLN
25.0000 g | Freq: Once | INTRAVENOUS | Status: AC
  Administered 2016-04-29: 25 g via INTRAVENOUS
  Filled 2016-04-29: qty 100

## 2016-05-05 ENCOUNTER — Other Ambulatory Visit: Payer: Self-pay | Admitting: Physician Assistant

## 2016-05-05 DIAGNOSIS — K7460 Unspecified cirrhosis of liver: Secondary | ICD-10-CM

## 2016-05-05 DIAGNOSIS — R188 Other ascites: Principal | ICD-10-CM

## 2016-05-06 ENCOUNTER — Ambulatory Visit
Admission: RE | Admit: 2016-05-06 | Discharge: 2016-05-06 | Disposition: A | Discharge status: Home / Self Care | Payer: Medicare Other | Source: Ambulatory Visit | Attending: Physician Assistant | Admitting: Physician Assistant

## 2016-05-06 DIAGNOSIS — K7460 Unspecified cirrhosis of liver: Secondary | ICD-10-CM | POA: Insufficient documentation

## 2016-05-06 DIAGNOSIS — R188 Other ascites: Secondary | ICD-10-CM | POA: Diagnosis not present

## 2016-05-06 MED ORDER — ALBUMIN HUMAN 25 % IV SOLN
25.0000 g | Freq: Once | INTRAVENOUS | Status: DC
  Administered 2016-05-06: 25 g via INTRAVENOUS

## 2016-05-06 MED ORDER — ALBUMIN HUMAN 25 % IV SOLN
25.0000 g | Freq: Once | INTRAVENOUS | Status: DC
  Filled 2016-05-06 (×2): qty 100

## 2016-05-06 NOTE — Procedures (Signed)
RLQ paracentesis Amount pending EBL 0 Comp 0

## 2016-05-09 NOTE — Discharge Instructions (Signed)
Paracentesis, Care After  Refer to this sheet in the next few weeks. These instructions provide you with information about caring for yourself after your procedure. Your health care provider may also give you more specific instructions. Your treatment has been planned according to current medical practices, but problems sometimes occur. Call your health care provider if you have any problems or questions after your procedure.  What can I expect after the procedure?  After your procedure, it is common to have a small amount of clear fluid coming from the puncture site.  Follow these instructions at home:  · Return to your normal activities as told by your health care provider. Ask your health care provider what activities are safe for you.  · Take over-the-counter and prescription medicines only as told by your health care provider.  · Do not take baths, swim, or use a hot tub until your health care provider approves.  · Follow instructions from your health care provider about:  ? How to take care of your puncture site.  ? When and how you should change your bandage (dressing).  ? When you should remove your dressing.  · Check your puncture area every day signs of infection. Watch for:  ? Redness, swelling, or pain.  ? Fluid, blood, or pus.  · Keep all follow-up visits as told by your health care provider. This is important.  Contact a health care provider if:  · You have redness, swelling, or pain at your puncture site.  · You start to have more clear fluid coming from your puncture site.  · You have blood or pus coming from your puncture site.  · You have chills.  · You have a fever.  Get help right away if:  · You develop chest pain or shortness of breath.  · You develop increasing pain, discomfort, or swelling in your abdomen.  · You feel dizzy or light-headed or you pass out.  This information is not intended to replace advice given to you by your health care provider. Make sure you discuss any questions you  have with your health care provider.  Document Released: 05/30/2014 Document Revised: 06/21/2015 Document Reviewed: 03/28/2014  Elsevier Interactive Patient Education © 2017 [employer]

## 2016-05-12 ENCOUNTER — Other Ambulatory Visit: Payer: Self-pay | Admitting: Physician Assistant

## 2016-05-12 DIAGNOSIS — K7460 Unspecified cirrhosis of liver: Secondary | ICD-10-CM

## 2016-05-12 DIAGNOSIS — R188 Other ascites: Principal | ICD-10-CM

## 2016-05-12 NOTE — Discharge Instructions (Signed)
Paracentesis, Care After  Refer to this sheet in the next few weeks. These instructions provide you with information about caring for yourself after your procedure. Your health care provider may also give you more specific instructions. Your treatment has been planned according to current medical practices, but problems sometimes occur. Call your health care provider if you have any problems or questions after your procedure.  What can I expect after the procedure?  After your procedure, it is common to have a small amount of clear fluid coming from the puncture site.  Follow these instructions at home:  · Return to your normal activities as told by your health care provider. Ask your health care provider what activities are safe for you.  · Take over-the-counter and prescription medicines only as told by your health care provider.  · Do not take baths, swim, or use a hot tub until your health care provider approves.  · Follow instructions from your health care provider about:  ? How to take care of your puncture site.  ? When and how you should change your bandage (dressing).  ? When you should remove your dressing.  · Check your puncture area every day signs of infection. Watch for:  ? Redness, swelling, or pain.  ? Fluid, blood, or pus.  · Keep all follow-up visits as told by your health care provider. This is important.  Contact a health care provider if:  · You have redness, swelling, or pain at your puncture site.  · You start to have more clear fluid coming from your puncture site.  · You have blood or pus coming from your puncture site.  · You have chills.  · You have a fever.  Get help right away if:  · You develop chest pain or shortness of breath.  · You develop increasing pain, discomfort, or swelling in your abdomen.  · You feel dizzy or light-headed or you pass out.  This information is not intended to replace advice given to you by your health care provider. Make sure you discuss any questions you  have with your health care provider.  Document Released: 05/30/2014 Document Revised: 06/21/2015 Document Reviewed: 03/28/2014  Elsevier Interactive Patient Education © 2017 [employer]

## 2016-05-13 ENCOUNTER — Ambulatory Visit
Admission: RE | Admit: 2016-05-13 | Discharge: 2016-05-13 | Disposition: A | Discharge status: Home / Self Care | Payer: Medicare Other | Source: Ambulatory Visit | Attending: Physician Assistant | Admitting: Physician Assistant

## 2016-05-15 ENCOUNTER — Ambulatory Visit
Admission: RE | Admit: 2016-05-15 | Discharge: 2016-05-15 | Disposition: A | Discharge status: Home / Self Care | Payer: Medicare Other | Source: Ambulatory Visit | Attending: Physician Assistant | Admitting: Physician Assistant

## 2016-05-15 DIAGNOSIS — R188 Other ascites: Secondary | ICD-10-CM | POA: Insufficient documentation

## 2016-05-15 DIAGNOSIS — K7460 Unspecified cirrhosis of liver: Secondary | ICD-10-CM | POA: Diagnosis not present

## 2016-05-15 MED ORDER — ALBUMIN HUMAN 25 % IV SOLN
50.0000 g | Freq: Once | INTRAVENOUS | Status: AC
  Administered 2016-05-15: 50 g via INTRAVENOUS
  Filled 2016-05-15 (×2): qty 200

## 2016-05-15 NOTE — Procedures (Signed)
US guided paracentesis.  Removed 5 liters of yellow fluid.  Minimal blood loss and no immediate complication.

## 2016-05-20 ENCOUNTER — Ambulatory Visit: Payer: Medicare Other

## 2016-05-20 ENCOUNTER — Other Ambulatory Visit: Payer: Self-pay | Admitting: Gastroenterology

## 2016-05-20 ENCOUNTER — Other Ambulatory Visit: Payer: Medicare Other

## 2016-05-20 DIAGNOSIS — K7031 Alcoholic cirrhosis of liver with ascites: Secondary | ICD-10-CM

## 2016-05-22 ENCOUNTER — Ambulatory Visit: Payer: Medicare Other

## 2016-05-22 ENCOUNTER — Ambulatory Visit
Admission: RE | Admit: 2016-05-22 | Discharge: 2016-05-22 | Disposition: A | Discharge status: Home / Self Care | Payer: Medicare Other | Source: Ambulatory Visit | Attending: Gastroenterology | Admitting: Gastroenterology

## 2016-05-22 DIAGNOSIS — K7460 Unspecified cirrhosis of liver: Secondary | ICD-10-CM | POA: Insufficient documentation

## 2016-05-22 DIAGNOSIS — K7031 Alcoholic cirrhosis of liver with ascites: Secondary | ICD-10-CM

## 2016-05-22 DIAGNOSIS — R188 Other ascites: Secondary | ICD-10-CM | POA: Diagnosis not present

## 2016-05-22 MED ORDER — ALBUMIN HUMAN 25 % IV SOLN
50.0000 g | Freq: Once | INTRAVENOUS | Status: AC
  Administered 2016-05-22: 25 g via INTRAVENOUS
  Filled 2016-05-22: qty 200

## 2016-05-22 NOTE — Procedures (Signed)
Ultrasound-guided therapeutic paracentesis performed yielding 5 liters (maximum ordered) of yellow fluid. No immediate complications. He will receive IV albumin postprocedure.

## 2016-05-23 ENCOUNTER — Encounter: Payer: Medicare Other | Attending: Physician Assistant | Admitting: Physician Assistant

## 2016-05-23 DIAGNOSIS — Z85118 Personal history of other malignant neoplasm of bronchus and lung: Secondary | ICD-10-CM | POA: Insufficient documentation

## 2016-05-23 DIAGNOSIS — N183 Chronic kidney disease, stage 3 (moderate): Secondary | ICD-10-CM | POA: Diagnosis not present

## 2016-05-23 DIAGNOSIS — E1140 Type 2 diabetes mellitus with diabetic neuropathy, unspecified: Secondary | ICD-10-CM | POA: Diagnosis not present

## 2016-05-23 DIAGNOSIS — K219 Gastro-esophageal reflux disease without esophagitis: Secondary | ICD-10-CM | POA: Insufficient documentation

## 2016-05-23 DIAGNOSIS — G4730 Sleep apnea, unspecified: Secondary | ICD-10-CM | POA: Diagnosis not present

## 2016-05-23 DIAGNOSIS — I2510 Atherosclerotic heart disease of native coronary artery without angina pectoris: Secondary | ICD-10-CM | POA: Insufficient documentation

## 2016-05-23 DIAGNOSIS — I129 Hypertensive chronic kidney disease with stage 1 through stage 4 chronic kidney disease, or unspecified chronic kidney disease: Secondary | ICD-10-CM | POA: Diagnosis not present

## 2016-05-23 DIAGNOSIS — M109 Gout, unspecified: Secondary | ICD-10-CM | POA: Diagnosis not present

## 2016-05-23 DIAGNOSIS — Z9221 Personal history of antineoplastic chemotherapy: Secondary | ICD-10-CM | POA: Insufficient documentation

## 2016-05-23 DIAGNOSIS — Z923 Personal history of irradiation: Secondary | ICD-10-CM | POA: Diagnosis not present

## 2016-05-23 DIAGNOSIS — Z87891 Personal history of nicotine dependence: Secondary | ICD-10-CM | POA: Diagnosis not present

## 2016-05-23 DIAGNOSIS — Z6833 Body mass index (BMI) 33.0-33.9, adult: Secondary | ICD-10-CM | POA: Insufficient documentation

## 2016-05-23 DIAGNOSIS — E785 Hyperlipidemia, unspecified: Secondary | ICD-10-CM | POA: Diagnosis not present

## 2016-05-23 DIAGNOSIS — K7469 Other cirrhosis of liver: Secondary | ICD-10-CM | POA: Diagnosis not present

## 2016-05-23 DIAGNOSIS — D500 Iron deficiency anemia secondary to blood loss (chronic): Secondary | ICD-10-CM | POA: Insufficient documentation

## 2016-05-23 DIAGNOSIS — Z794 Long term (current) use of insulin: Secondary | ICD-10-CM | POA: Insufficient documentation

## 2016-05-23 DIAGNOSIS — E1122 Type 2 diabetes mellitus with diabetic chronic kidney disease: Secondary | ICD-10-CM | POA: Insufficient documentation

## 2016-05-23 DIAGNOSIS — E11622 Type 2 diabetes mellitus with other skin ulcer: Secondary | ICD-10-CM | POA: Insufficient documentation

## 2016-05-25 NOTE — Progress Notes (Signed)
[name] ([id number]) Visit Report for 05/23/2016 Chief Complaint Document Details Patient Name: [name], [name]. Date of Service: 05/23/2016 10:45 AM Medical Record Number: [id number] Patient Account Number: [id number] Date of Birth/Sex: December 09, 1945 (71 y.o. Male) Treating RN: Ahmed Prima Primary Care Provider: BABAOFF, MARCUS Other Clinician: Referring Provider: BABAOFF, MARCUS Treating Provider/Extender: STONE III, Seymone Forlenza Weeks in Treatment: 0 Information Obtained from: Patient Chief Complaint Left gluteal ulcer Electronic Signature([name]) Signed: 05/23/2016 5:05:38 PM By: Worthy Keeler PA-C Entered By: Worthy Keeler on 05/23/2016 11:18:48 [name], [name] ([id number]) -------------------------------------------------------------------------------- HPI Details Patient Name: [name] Date of Service: 05/23/2016 10:45 AM Medical Record Number: [id number] Patient Account Number: [id number] Date of Birth/Sex: 1945-10-24 (71 y.o. Male) Treating RN: Carolyne Fiscal, Debi Primary Care Provider: BABAOFF, MARCUS Other Clinician: Referring Provider: BABAOFF, MARCUS Treating Provider/Extender: STONE III, Sebasthian Stailey Weeks in Treatment: 0 History of Present Illness Location: Left gluteus Quality: N/A Severity: N/A Duration: 3 months prior to evaluation at the wound center Timing: N/A Context: Pressure to the left gluteal region Modifying Factors: Barrier cream has been used by patientos wife prior to a valuation here in the clinic Associated Signs and Symptoms: Coronary artery disease, stage III kidney disease, fatty liver, non- alcoholic cirrhosis, hypertension, iron deficiency anemia, morbid obesity, diabetes mellitus type two HPI Description: 05/23/16 patient presents for initial evaluation today concerning a wound he has had in the left gluteal region for approximately three months. There was a scab over the area upon presentation in our clinic and when this was cleansed there  appears to be healed skin underneath the scab. This wound has fully closed with treatment up to this point prior to his presentation. His wife was present for the evaluation as well and was shocked that there was no wound. With that being said both she and he were happy he obviously has enough doctors appointments dealing with his cirrhosis and abdominal fluid management that one less appointment is welcome. Electronic Signature([name]) Signed: 05/23/2016 5:05:38 PM By: Worthy Keeler PA-C Entered By: Worthy Keeler on 05/23/2016 12:37:12 [name], [name] ([id number]) -------------------------------------------------------------------------------- Physical Exam Details Patient Name: [name], [name]. Date of Service: 05/23/2016 10:45 AM Medical Record Number: [id number] Patient Account Number: [id number] Date of Birth/Sex: [birth year] (71 y.o. Male) Treating RN: Ahmed Prima Primary Care Provider: BABAOFF, MARCUS Other Clinician: Referring Provider: BABAOFF, MARCUS Treating Provider/Extender: STONE III, Chelsea Nusz Weeks in Treatment: 0 Constitutional supine blood pressure is within target range for patient.. pulse regular and within target range for patient.Marland Kitchen respirations regular, non-labored and within target range for patient.Marland Kitchen temperature within target range for patient.. chronically ill appearing I didnot know a parrot acute distress. Eyes conjunctiva clear no eyelid edema noted. pupils equal round and reactive to light and accommodation. Ears, Nose, Mouth, and Throat no gross abnormality of ear auricles or external auditory canals. normal hearing noted during conversation. mucus membranes moist. Respiratory normal breathing without difficulty. clear to auscultation bilaterally. Cardiovascular regular rate and rhythm with normal S1, S2 patient has a heart murmur. 1+ pitting edema of the bilateral LEs. Gastrointestinal (GI) Patient has a distended abdominal region secondary to fluid  buildup in relation to his liver cirrhosis. This is drained on a regular basis. He does appear to have normal bowel sounds. Musculoskeletal Patient unable to walk without assistance. no significant deformity or arthritic changes, no loss or range of motion, no clubbing. Integumentary (Hair, Skin) Patient has senile skin changes which causes frequent bruising with even mild trauma. Psychiatric this patient is able to  make decisions and demonstrates good insight into disease process. Patient is oriented to person and place. pleasant and cooperative. Electronic Signature([name]) Signed: 05/23/2016 5:05:38 PM By: Worthy Keeler PA-C Entered By: Worthy Keeler on 05/23/2016 12:42:41 [name] ([id number]) -------------------------------------------------------------------------------- Physician Orders Details Patient Name: [name], [name] Date of Service: 05/23/2016 10:45 AM Medical Record Number: [id number] Patient Account Number: [id number] Date of Birth/Sex: [birth year] (71 y.o. Male) Treating RN: Carolyne Fiscal, Debi Primary Care Provider: BABAOFF, MARCUS Other Clinician: Referring Provider: BABAOFF, MARCUS Treating Provider/Extender: STONE III, Otha Monical Weeks in Treatment: 0 Verbal / Phone Orders: Yes Clinician: Pinkerton, Debi Read Back and Verified: Yes Diagnosis Coding Discharge From [facility] o Discharge from [place] and reposition every 2 hours. Use skin prep over area then use the barrier cream daily. Please call or contact our office if you have any questions or concerns. Electronic Signature([name]) Signed: 05/23/2016 5:05:38 PM By: Worthy Keeler PA-C Entered By: Worthy Keeler on 05/23/2016 12:43:22 [name], [name] ([id number]) -------------------------------------------------------------------------------- Problem List Details Patient Name: [name], [name]. Date of Service: 05/23/2016 10:45 AM Medical Record Number: [id number] Patient Account  Number: [id number] Date of Birth/Sex: [birth year] (71 y.o. 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) 05/23/2016 Yes E66.01 Morbid (severe) obesity due to excess calories 05/23/2016 Yes I25.10 Atherosclerotic heart disease of native coronary artery 05/23/2016 Yes without angina pectoris Inactive Problems Resolved Problems Electronic Signature([name]) Signed: 05/23/2016 5:05:38 PM By: Worthy Keeler PA-C Entered By: Worthy Keeler on 05/23/2016 11:18:21 [name], [name] ([id number]) -------------------------------------------------------------------------------- Progress Note Details Patient Name: [name] Date of Service: 05/23/2016 10:45 AM Medical Record Number: [id number] Patient Account Number: [id number] Date of Birth/Sex: [birth year] (71 y.o. Male) Treating RN: Carolyne Fiscal, Debi Primary Care Provider: BABAOFF, MARCUS Other Clinician: Referring Provider: BABAOFF, MARCUS Treating Provider/Extender: STONE III, Irys Nigh Weeks in Treatment: 0 Subjective Chief Complaint Information obtained from Patient Left gluteal ulcer History of Present Illness (HPI) The following HPI elements were documented for the patient'[name] wound: Location: Left gluteus Quality: N/A Severity: N/A Duration: 3 months prior to evaluation at the wound center Timing: N/A Context: Pressure to the left gluteal region Modifying Factors: Barrier cream has been used by patient [name] wife prior to a valuation  here in the clinic Associated Signs and Symptoms: Coronary artery disease, stage III kidney disease, fatty liver, non- alcoholic cirrhosis, hypertension, iron deficiency anemia, morbid obesity, diabetes mellitus type two 05/23/16 patient presents for initial evaluation today concerning a wound he has had in the left gluteal region for approximately three months. There was a scab over the area upon presentation in our clinic and when this was cleansed there appears to be healed skin underneath the scab. This wound has fully closed with treatment up to this point prior to his presentation. His wife was present for the evaluation as well and was shocked that there was no wound. With that being said both she and he were happy he obviously has enough doctors appointments dealing with his cirrhosis and abdominal fluid management that one less appointment is welcome. Wound History Patient presents with 2  open wounds that have been present for approximately 4 months. Patient has been treating wounds in the following manner: barrier cream. Laboratory tests have not been performed in the last month. Patient reportedly has not tested positive for an antibiotic resistant organism. Patient reportedly has not tested positive for osteomyelitis. Patient reportedly has not had testing performed to evaluate circulation in the legs. Patient experiences the following problems associated with their wounds: swelling. Patient History Information obtained from Patient. Allergies NKDA [name], [name] ([id number]) Family History Cancer - Maternal Grandparents, Paternal Grandparents, Diabetes - Father, Mother, Heart Disease - Father, Mother. Social History Former smoker - quit 10 years ago, Marital Status - Married, Alcohol Use - Never, Drug Use - No History, Caffeine Use - Moderate. Medical History Eyes Patient has history of Cataracts - surgery Hematologic/Lymphatic Patient has history of  Anemia Respiratory Patient has history of Sleep Apnea Cardiovascular Patient has history of Coronary Artery Disease, Hypertension, Myocardial Infarction Gastrointestinal Patient has history of Cirrhosis Endocrine Patient has history of Type II Diabetes Musculoskeletal Patient has history of Gout Neurologic Patient has history of Neuropathy Oncologic Patient has history of Received Chemotherapy - 2015, Received Radiation - 2015 Patient is treated with Insulin. Blood sugar is tested. Review of Systems (ROS) Constitutional Symptoms (General Health) The patient has no complaints or symptoms. Ear/Nose/Mouth/Throat The patient has no complaints or symptoms. Hematologic/Lymphatic The patient has no complaints or symptoms. Respiratory Complains or has symptoms of Shortness of Breath - with exertion. Cardiovascular hyperlipidemia heart murmur Gastrointestinal esophageal varices gerd hx GI bleed fatty liver Genitourinary ckd stage III Immunological The patient has no complaints or symptoms. Integumentary (Skin) Complains or has symptoms of Wounds. Oncologic [name], [name] ([id number]) small cell lung cancer 2015 Psychiatric The patient has no complaints or symptoms. Objective Constitutional supine blood pressure is within target range for patient.. pulse regular and within target range for patient.Marland Kitchen respirations regular, non-labored and within target range for patient.Marland Kitchen temperature within target range for patient.. chronically ill appearing I didn t know a parrot acute distress. Vitals Time Taken: 10:27 AM, Height: 74 in, Source: Stated, Weight: 257 lbs, Source: Measured, BMI: 33, Temperature: 98.0 F, Pulse: 73 bpm, Respiratory Rate: 16 breaths/min, Blood Pressure: 111/53 mmHg. Eyes conjunctiva clear no eyelid edema noted. pupils equal round and reactive to light and accommodation. Ears, Nose, Mouth, and Throat no gross abnormality of ear auricles or external auditory  canals. normal hearing noted during conversation. mucus membranes moist. Respiratory normal breathing without difficulty. clear to auscultation bilaterally. Cardiovascular regular rate and rhythm with normal S1, S2 patient has a heart murmur. 1+ pitting edema of the bilateral LEs. Gastrointestinal (GI) Patient has a distended abdominal region secondary to fluid buildup in relation to his liver cirrhosis. This is drained on a regular basis. He does appear to have normal bowel sounds. Musculoskeletal Patient unable to walk without assistance. no significant deformity or arthritic changes, no loss or range of motion, no clubbing. Psychiatric this patient is able to make decisions and demonstrates good insight into disease process. Patient is oriented to person and place. pleasant and cooperative. Integumentary (Hair, Skin) Reever, Ankith O. ([id number]) Patient has senile skin changes which causes frequent bruising with even mild trauma. Assessment Active Problems ICD-10 E11.622 - Type 2 diabetes mellitus with other skin ulcer K74.69 - Other cirrhosis of liver N18.3 - Chronic kidney disease, stage 3 (moderate) I10 - Essential (primary) hypertension D50.0 - Iron deficiency anemia secondary to blood loss (chronic) E66.01 - Morbid (severe) obesity due to  excess calories I25.10 - Atherosclerotic heart disease of native coronary artery without angina pectoris Plan Discharge From [facility]: Discharge from [place] and reposition every 2 hours. Use skin prep over area then use the barrier cream daily. Please call or contact our office if you have any questions or concerns. Since this wound is healed I suggested we initiate skin prep daily and cover this with the zinc oxide cream 1-2 times a day. I recommend that this be done for the next two weeks minimally although it could be done on an ongoing basis if his wife would prefer. This will help to protect the area. Otherwise  he has no open wound and we will discontinue wound care services. Electronic Signature([name]) Signed: 05/23/2016 5:05:38 PM By: Worthy Keeler PA-C Entered By: Worthy Keeler on 05/23/2016 12:44:50 Youtz, [name] ([id number]) -------------------------------------------------------------------------------- ROS/PFSH Details Patient Name: [name], [name] Date of Service: 05/23/2016 10:45 AM Medical Record Number: [id number] Patient Account Number: [id number] Date of Birth/Sex: 04/09/1945 (71 y.o. Male) Treating RN: Carolyne Fiscal, Debi Primary Care Provider: BABAOFF, MARCUS Other Clinician: Referring Provider: BABAOFF, MARCUS Treating Provider/Extender: STONE III, Annikah Lovins Weeks in Treatment: 0 Information Obtained From Patient Wound History Do you currently have one or more open woundso Yes How many open wounds do you currently haveo 2 Approximately how long have you had your woundso 4 months How have you been treating your wound([name]) until nowo barrier cream Has your wound([name]) ever healed and then re-openedo No Have you had any lab work done in the past montho No Have you tested positive for an antibiotic resistant organism (MRSA, VRE)o No Have you tested positive for osteomyelitis (bone infection)o No Have you had any tests for circulation on your legso No Have you had other problems associated with your woundso Swelling Respiratory Complaints and Symptoms: Positive for: Shortness of Breath - with exertion Medical History: Positive for: Sleep Apnea Integumentary (Skin) Complaints and Symptoms: Positive for: Wounds Constitutional Symptoms (General Health) Complaints and Symptoms: No Complaints or Symptoms Eyes Medical History: Positive for: Cataracts - surgery Ear/Nose/Mouth/Throat Complaints and Symptoms: No Complaints or Symptoms [name], [name] ([id number]) Hematologic/Lymphatic Complaints and Symptoms: No Complaints or Symptoms Medical History: Positive for:  Anemia Cardiovascular Complaints and Symptoms: Review of System Notes: hyperlipidemia heart murmur Medical History: Positive for: Coronary Artery Disease; Hypertension; Myocardial Infarction Gastrointestinal Complaints and Symptoms: Review of System Notes: esophageal varices gerd hx GI bleed fatty liver Medical History: Positive for: Cirrhosis Endocrine Medical History: Positive for: Type II Diabetes Time with diabetes: 34 years Treated with: Insulin Blood sugar tested every day: Yes Tested : Genitourinary Complaints and Symptoms: Review of System Notes: ckd stage III Immunological Complaints and Symptoms: No Complaints or Symptoms Musculoskeletal SLYVESTER, LATONA ([id number]) Medical History: Positive for: Gout Neurologic Medical History: Positive for: Neuropathy Oncologic Complaints and Symptoms: Review of System Notes: small cell lung cancer 2015 Medical History: Positive for: Received Chemotherapy - 2015; Received Radiation - 2015 Psychiatric Complaints and Symptoms: No Complaints or Symptoms HBO Extended History Items Eyes: Cataracts Immunizations Pneumococcal Vaccine: Received Pneumococcal Vaccination: Yes Family and Social History Cancer: Yes - Maternal Grandparents, Paternal Grandparents; Diabetes: Yes - Father, Mother; Heart Disease: Yes - Father, Mother; Former smoker - quit 10 years ago; Marital Status - Married; Alcohol Use: Never; Drug Use: No History; Caffeine Use: Moderate; Financial Concerns: No; Food, Clothing or Shelter Needs: No; Support System Lacking: No; Transportation Concerns: No; Advanced Directives: No; Patient does not want information on Advanced Directives; Do  not resuscitate: No; Living Will: Yes (Not Provided); Medical Power of Attorney: Yes Angela Nevin (wife) (Not Provided) Electronic Signature([name]) Signed: 05/23/2016 4:51:20 PM By: Alric Quan Signed: 05/23/2016 5:05:38 PM By: Worthy Keeler PA-C Entered By: Alric Quan on 05/23/2016 10:38:55 [name] ([id number]) -------------------------------------------------------------------------------- [place] Details Patient Name: DETRAVION, TESTER. Date of Service: 05/23/2016 Medical Record Number: [id number] Patient Account Number: [id number] Date of Birth/Sex: [birth year] (71 y.o. 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) E66.01 Morbid (severe) obesity due to excess calories I25.10 Atherosclerotic heart disease of native coronary artery without angina pectoris Facility Procedures CPT4 Code: [id number] Description: 99213 - WOUND CARE VISIT-LEV 3 EST PT Modifier: Quantity: 1 Physician Procedures CPT4 Code: [id number] Description: WC PHYS LEVEL 3 o NEW PT ICD-10 Description Diagnosis E11.622 Type 2 diabetes mellitus with other skin ulc K74.69 Other cirrhosis of liver N18.3 Chronic kidney disease, stage 3 (moderate) I10 Essential (primary) hypertension Modifier: er Quantity: 1 Electronic Signature([name]) Signed: 05/23/2016 4:51:20 PM By: Alric Quan Signed: 05/23/2016 5:05:38 PM By: Worthy Keeler PA-C Entered By: Alric Quan on 05/23/2016 13:08:56

## 2016-05-25 NOTE — Progress Notes (Signed)
LAMONE, FERRELLI ([id number]) Visit Report for 05/23/2016 Allergy List Details Patient Name: [name], [name]. Date of Service: 05/23/2016 10:45 AM Medical Record Number: [id number] Patient Account Number: [id number] Date of Birth/Sex: 30-Dec-1945 (71 y.o. Male) Treating RN: Ahmed Prima Primary Care Jinx Gilden: BABAOFF, MARCUS Other Clinician: Referring Meir Elwood: BABAOFF, MARCUS Treating Kiera Hussey/Extender: STONE III, HOYT Weeks in Treatment: 0 Allergies Active Allergies NKDA Allergy Notes Electronic Signature(s) Signed: 05/23/2016 4:51:20 PM By: Alric Quan Entered By: Alric Quan on 05/23/2016 10:28:59 [name] ([id number]) -------------------------------------------------------------------------------- Arrival Information Details Patient Name: [name] Date of Service: 05/23/2016 10:45 AM Medical Record Number: [id number] Patient Account Number: [id number] Date of Birth/Sex: 10-27-1945 (71 y.o. Male) Treating RN: Ahmed Prima Primary Care Darryn Kydd: BABAOFF, MARCUS Other Clinician: Referring Tavio Biegel: BABAOFF, MARCUS Treating Brealynn Contino/Extender: STONE III, HOYT Weeks in Treatment: 0 Visit Information Patient Arrived: Wheel Chair Arrival Time: 10:22 Accompanied By: wife Transfer Assistance: EasyPivot Patient Lift Patient Identification Verified: Yes Secondary Verification Process Yes Completed: Patient Requires Transmission- No Based Precautions: Patient Has Alerts: Yes Patient Alerts: DM II Electronic Signature(s) Signed: 05/23/2016 4:51:20 PM By: Alric Quan Entered By: Alric Quan on 05/23/2016 10:26:46 [name] ([id number]) -------------------------------------------------------------------------------- Clinic Level of Care Assessment Details Patient Name: [name] Date of Service: 05/23/2016 10:45 AM Medical Record Number: [id number] Patient Account Number: [id number] Date of Birth/Sex: Jan 21, 1946 (71 y.o.  Male) Treating RN: Ahmed Prima Primary Care Tyresha Fede: BABAOFF, MARCUS Other Clinician: Referring Kellyjo Edgren: BABAOFF, MARCUS Treating Taydem Cavagnaro/Extender: STONE III, HOYT Weeks in Treatment: 0 Clinic Level of Care Assessment Items TOOL 2 Quantity Score X - Use when only an EandM is performed on the INITIAL visit 1 0 ASSESSMENTS - Nursing Assessment / Reassessment X - General Physical Exam (combine w/ comprehensive assessment (listed just 1 20 below) when performed on new pt. evals) X - Comprehensive Assessment (HX, ROS, Risk Assessments, Wounds Hx, etc.) 1 25 ASSESSMENTS - Wound and Skin Assessment / Reassessment X - Simple Wound Assessment / Reassessment - one wound 1 5 '[]'$  - Complex Wound Assessment / Reassessment - multiple wounds 0 '[]'$  - Dermatologic / Skin Assessment (not related to wound area) 0 ASSESSMENTS - Ostomy and/or Continence Assessment and Care '[]'$  - Incontinence Assessment and Management 0 '[]'$  - Ostomy Care Assessment and Management (repouching, etc.) 0 PROCESS - Coordination of Care X - Simple Patient / Family Education for ongoing care 1 15 '[]'$  - Complex (extensive) Patient / Family Education for ongoing care 0 '[]'$  - Staff obtains Programmer, systems, Records, Test Results / Process Orders 0 '[]'$  - Staff telephones HHA, Nursing Homes / Clarify orders / etc 0 '[]'$  - Routine Transfer to another Facility (non-emergent condition) 0 '[]'$  - Routine Hospital Admission (non-emergent condition) 0 X - New Admissions / Biomedical engineer / Ordering NPWT, Apligraf, etc. 1 15 '[]'$  - Emergency Hospital Admission (emergent condition) 0 X - Simple Discharge Coordination 1 10 Gentz, Hollie O. ([id number]) '[]'$  - Complex (extensive) Discharge Coordination 0 PROCESS - Special Needs '[]'$  - Pediatric / Minor Patient Management 0 '[]'$  - Isolation Patient Management 0 '[]'$  - Hearing / Language / Visual special needs 0 '[]'$  - Assessment of Community assistance (transportation, D/C planning, etc.) 0 '[]'$  -  Additional assistance / Altered mentation 0 '[]'$  - Support Surface(s) Assessment (bed, cushion, seat, etc.) 0 INTERVENTIONS - Wound Cleansing / Measurement '[]'$  - Wound Imaging (photographs - any number of wounds) 0 '[]'$  - Wound Tracing (instead of photographs) 0 '[]'$  - Simple Wound Measurement - one wound 0 '[]'$  - Complex Wound Measurement -  multiple wounds 0 '[]'$  - Simple Wound Cleansing - one wound 0 '[]'$  - Complex Wound Cleansing - multiple wounds 0 INTERVENTIONS - Wound Dressings '[]'$  - Small Wound Dressing one or multiple wounds 0 '[]'$  - Medium Wound Dressing one or multiple wounds 0 '[]'$  - Large Wound Dressing one or multiple wounds 0 '[]'$  - Application of Medications - injection 0 INTERVENTIONS - Miscellaneous '[]'$  - External ear exam 0 '[]'$  - Specimen Collection (cultures, biopsies, blood, body fluids, etc.) 0 '[]'$  - Specimen(s) / Culture(s) sent or taken to Lab for analysis 0 X - Patient Transfer (multiple staff / Civil Service fast streamer / Similar devices) 1 10 '[]'$  - Simple Staple / Suture removal (25 or less) 0 '[]'$  - Complex Staple / Suture removal (26 or more) 0 Ricciardi, Andrius O. ([id number]) '[]'$  - Hypo / Hyperglycemic Management (close monitor of Blood Glucose) 0 '[]'$  - Ankle / Brachial Index (ABI) - do not check if billed separately 0 Has the patient been seen at the hospital within the last three years: Yes Total Score: 100 Level Of Care: New/Established - Level 3 Electronic Signature(s) Signed: 05/23/2016 4:51:20 PM By: Alric Quan Entered By: Alric Quan on 05/23/2016 13:08:43 [name] ([id number]) -------------------------------------------------------------------------------- Encounter Discharge Information Details Patient Name: [name] Date of Service: 05/23/2016 10:45 AM Medical Record Number: [id number] Patient Account Number: [id number] Date of Birth/Sex: 1945-06-16 (71 y.o. Male) Treating RN: Ahmed Prima Primary Care Raigan Baria: BABAOFF, MARCUS Other Clinician: Referring  Lexis Potenza: BABAOFF, MARCUS Treating Jeven Topper/Extender: STONE III, HOYT Weeks in Treatment: 0 Encounter Discharge Information Items Discharge Pain Level: 0 Discharge Condition: Stable Ambulatory Status: Wheelchair Discharge Destination: Home Transportation: Private Auto Accompanied By: wife Schedule Follow-up Appointment: No Medication Reconciliation completed and provided to Patient/Care No Saory Carriero: Provided on Clinical Summary of Care: 05/23/2016 Form Type Recipient Paper Patient RP Electronic Signature(s) Signed: 05/23/2016 11:19:45 AM By: Ruthine Dose Entered By: Ruthine Dose on 05/23/2016 11:19:44 Lafitte, Amador Cunas ([id number]) -------------------------------------------------------------------------------- Lower Extremity Assessment Details Patient Name: [name] Date of Service: 05/23/2016 10:45 AM Medical Record Number: [id number] Patient Account Number: [id number] Date of Birth/Sex: Oct 16, 1945 (71 y.o. Male) Treating RN: Ahmed Prima Primary Care Naziah Portee: BABAOFF, MARCUS Other Clinician: Referring Lauro Manlove: BABAOFF, MARCUS Treating Nadina Fomby/Extender: STONE III, HOYT Weeks in Treatment: 0 Electronic Signature(s) Signed: 05/23/2016 4:51:20 PM By: Alric Quan Entered By: Alric Quan on 05/23/2016 10:41:33 Duby, Amador Cunas ([id number]) -------------------------------------------------------------------------------- Multi Wound Chart Details Patient Name: [name] Date of Service: 05/23/2016 10:45 AM Medical Record Number: [id number] Patient Account Number: [id number] Date of Birth/Sex: 10/23/1945 (71 y.o. Male) Treating RN: Ahmed Prima Primary Care Devarious Pavek: BABAOFF, MARCUS Other Clinician: Referring Mitzie Marlar: BABAOFF, MARCUS Treating Virgie Chery/Extender: STONE III, HOYT Weeks in Treatment: 0 Vital Signs Height(in): 74 Pulse(bpm): 73 Weight(lbs): 257 Blood Pressure 111/53 (mmHg): Body Mass Index(BMI): 33 Temperature(F):  98.0 Respiratory Rate 16 (breaths/min): Wound Assessments Treatment Notes Electronic Signature(s) Signed: 05/23/2016 4:51:20 PM By: Alric Quan Entered By: Alric Quan on 05/23/2016 11:06:40 [name] ([id number]) -------------------------------------------------------------------------------- [place] Details Patient Name: [name], [name]. Date of Service: 05/23/2016 10:45 AM Medical Record Number: [id number] Patient Account Number: [id number] Date of Birth/Sex: 1945/06/07 (71 y.o. Male) Treating RN: Ahmed Prima Primary Care Cyerra Yim: BABAOFF, MARCUS Other Clinician: Referring Teoman Giraud: BABAOFF, MARCUS Treating Quisha Mabie/Extender: Melburn Hake, HOYT Weeks in Treatment: 0 Active Inactive Electronic Signature(s) Signed: 05/23/2016 4:51:20 PM By: Alric Quan Entered By: Alric Quan on 05/23/2016 11:06:25 [name] ([id number]) -------------------------------------------------------------------------------- Pain Assessment Details Patient Name: [name], [name]. Date of Service: 05/23/2016 10:45  AM Medical Record Number: [id number] Patient Account Number: [id number] Date of Birth/Sex: [birth year] (71 y.o. Male) Treating RN: Carolyne Fiscal, Debi Primary Care Aziz Slape: BABAOFF, MARCUS Other Clinician: Referring Bladimir Auman: BABAOFF, MARCUS Treating Lelaina Oatis/Extender: STONE III, HOYT Weeks in Treatment: 0 Active Problems Location of Pain Severity and Description of Pain Patient Has Paino No Site Locations With Dressing Change: No Pain Management and Medication Current Pain Management: Electronic Signature(s) Signed: 05/23/2016 4:51:20 PM By: Alric Quan Entered By: Alric Quan on 05/23/2016 10:26:59 [name] ([id number]) -------------------------------------------------------------------------------- Patient/Caregiver Education Details Patient Name: [name] Date of Service: 05/23/2016 10:45  AM Medical Record Number: [id number] Patient Account Number: [id number] Date of Birth/Gender: 02-07-1945 (71 y.o. Male) Treating RN: Ahmed Prima Primary Care Physician: BABAOFF, MARCUS Other Clinician: Referring Physician: BABAOFF, MARCUS Treating Physician/Extender: Melburn Hake, HOYT Weeks in Treatment: 0 Education Assessment Education Provided To: Patient and Caregiver wife Education Topics Provided Wound/Skin Impairment: Handouts: Other: Use skin prep over area then use the barrier cream daily. Methods: Demonstration, Explain/Verbal Responses: State content correctly Electronic Signature(s) Signed: 05/23/2016 4:51:20 PM By: Alric Quan Entered By: Alric Quan on 05/23/2016 11:08:43 [name] ([id number]) -------------------------------------------------------------------------------- [place] Details Patient Name: [name] Date of Service: 05/23/2016 10:45 AM Medical Record Number: [id number] Patient Account Number: [id number] Date of Birth/Sex: Jul 27, 1945 (71 y.o. Male) Treating RN: Carolyne Fiscal, Debi Primary Care Cervando Durnin: BABAOFF, MARCUS Other Clinician: Referring Vessie Olmsted: BABAOFF, MARCUS Treating Keanen Dohse/Extender: STONE III, HOYT Weeks in Treatment: 0 Vital Signs Time Taken: 10:27 Temperature (F): 98.0 Height (in): 74 Pulse (bpm): 73 Source: Stated Respiratory Rate (breaths/min): 16 Weight (lbs): 257 Blood Pressure (mmHg): 111/53 Source: Measured Reference Range: 80 - 120 mg / dl Body Mass Index (BMI): 33 Electronic Signature(s) Signed: 05/23/2016 4:51:20 PM By: Alric Quan Entered By: Alric Quan on 05/23/2016 10:27:36

## 2016-05-25 NOTE — Progress Notes (Signed)
KAEDAN, RICHERT ([id number]) Visit Report for 05/23/2016 Abuse/Suicide Risk Screen Details Patient Name: [name], [name]. Date of Service: 05/23/2016 10:45 AM Medical Record Number: [id number] Patient Account Number: [id number] Date of Birth/Sex: 1945-08-25 (71 y.o. Male) Treating RN: Carolyne Fiscal, Debi Primary Care Jaine Estabrooks: BABAOFF, MARCUS Other Clinician: Referring Keneisha Heckart: BABAOFF, MARCUS Treating Ellesse Antenucci/Extender: STONE III, HOYT Weeks in Treatment: 0 Abuse/Suicide Risk Screen Items Answer ABUSE/SUICIDE RISK SCREEN: Has anyone close to you tried to hurt or harm you recentlyo No Do you feel uncomfortable with anyone in your familyo No Has anyone forced you do things that you didnot want to doo No Do you have any thoughts of harming yourselfo No Patient displays signs or symptoms of abuse and/or neglect. No Electronic Signature(s) Signed: 05/23/2016 4:51:20 PM By: Alric Quan Entered By: Alric Quan on 05/23/2016 10:39:03 [name] ([id number]) -------------------------------------------------------------------------------- Activities of Daily Living Details Patient Name: [name], [name]. Date of Service: 05/23/2016 10:45 AM Medical Record Number: [id number] Patient Account Number: [id number] Date of Birth/Sex: 09-04-1945 (71 y.o. Male) Treating RN: Ahmed Prima Primary Care Emilygrace Grothe: BABAOFF, MARCUS Other Clinician: Referring Teyonna Plaisted: BABAOFF, MARCUS Treating Sofya Moustafa/Extender: STONE III, HOYT Weeks in Treatment: 0 Activities of Daily Living Items Answer Activities of Daily Living (Please select one for each item) Drive Automobile Not Able Take Medications Need Assistance Use Telephone Completely Able Care for Appearance Need Assistance Use Toilet Need Assistance Bath / Shower Need Assistance Dress Self Need Assistance Feed Self Completely Able Walk Not Able Get In / Out Bed Need Assistance Housework Not Able Prepare Meals Not Able Handle  Money Need Assistance Shop for Self Not Able Electronic Signature(s) Signed: 05/23/2016 4:51:20 PM By: Alric Quan Entered By: Alric Quan on 05/23/2016 10:39:52 [name] ([id number]) -------------------------------------------------------------------------------- Education Assessment Details Patient Name: [name] Date of Service: 05/23/2016 10:45 AM Medical Record Number: [id number] Patient Account Number: [id number] Date of Birth/Sex: 01-Sep-1945 (71 y.o. Male) Treating RN: Ahmed Prima Primary Care Loie Jahr: BABAOFF, MARCUS Other Clinician: Referring Garrison Michie: BABAOFF, MARCUS Treating Adonai Selsor/Extender: Melburn Hake, HOYT Weeks in Treatment: 0 Primary Learner Assessed: Patient Learning Preferences/Education Level/Primary Language Learning Preference: Explanation, Printed Material Highest Education Level: College or Above Preferred Language: Diplomatic Services operational officer Assessment/Beliefs Language Barrier: No Translator Needed: No Memory Deficit: No Emotional Barrier: No Physical Barrier Assessment Impaired Vision: No Impaired Hearing: No Decreased Hand dexterity: No Knowledge/Comprehension Assessment Knowledge Level: Medium Comprehension Level: Medium Ability to understand written Medium instructions: Ability to understand verbal Medium instructions: Motivation Assessment Anxiety Level: Calm Cooperation: Cooperative Education Importance: Acknowledges Need Interest in Health Problems: Asks Questions Perception: Coherent Willingness to Engage in Self- Medium Management Activities: Readiness to Engage in Self- Medium Management Activities: Electronic Signature(s) Signed: 05/23/2016 4:51:20 PM By: Grafton Folk ([id number]) Entered By: Alric Quan on 05/23/2016 10:40:16 [name] ([id number]) -------------------------------------------------------------------------------- Fall Risk Assessment  Details Patient Name: [name] Date of Service: 05/23/2016 10:45 AM Medical Record Number: [id number] Patient Account Number: [id number] Date of Birth/Sex: September 25, 1945 (71 y.o. Male) Treating RN: Carolyne Fiscal, Debi Primary Care Rahman Ferrall: BABAOFF, MARCUS Other Clinician: Referring Elleah Hemsley: BABAOFF, MARCUS Treating Konstantina Nachreiner/Extender: STONE III, HOYT Weeks in Treatment: 0 Fall Risk Assessment Items Have you had 2 or more falls in the last 12 monthso 0 Yes Have you had any fall that resulted in injury in the last 12 monthso 0 Yes FALL RISK ASSESSMENT: History of falling - immediate or within 3 months 0 No Secondary diagnosis 15 Yes Ambulatory aid None/bed rest/wheelchair/nurse 0 Yes Crutches/cane/walker 15 Yes Furniture 0  No IV Access/Saline Lock 0 No Gait/Training Normal/bed rest/immobile 0 No Weak 10 Yes Impaired 20 Yes Mental Status Oriented to own ability 0 Yes Electronic Signature(s) Signed: 05/23/2016 4:51:20 PM By: Alric Quan Entered By: Alric Quan on 05/23/2016 10:40:44 Grater, Amador Cunas ([id number]) -------------------------------------------------------------------------------- Foot Assessment Details Patient Name: [name]. Date of Service: 05/23/2016 10:45 AM Medical Record Number: [id number] Patient Account Number: [id number] Date of Birth/Sex: [birth year] (71 y.o. Male) Treating RN: Carolyne Fiscal, Debi Primary Care Fuquan Wilson: BABAOFF, MARCUS Other Clinician: Referring Shavette Shoaff: BABAOFF, MARCUS Treating Sabryna Lahm/Extender: STONE III, HOYT Weeks in Treatment: 0 Foot Assessment Items Site Locations + = Sensation present, - = Sensation absent, C = Callus, U = Ulcer R = Redness, W = Warmth, M = Maceration, PU = Pre-ulcerative lesion F = Fissure, S = Swelling, D = Dryness Assessment Right: Left: Other Deformity: No No Prior Foot Ulcer: No No Prior Amputation: No No Charcot Joint: No No Ambulatory Status: Gait: Electronic Signature(s) Signed:  05/23/2016 4:51:20 PM By: Alric Quan Entered By: Alric Quan on 05/23/2016 10:41:21 [name] ([id number]) -------------------------------------------------------------------------------- Nutrition Risk Assessment Details Patient Name: [name]. Date of Service: 05/23/2016 10:45 AM Medical Record Number: [id number] Patient Account Number: [id number] Date of Birth/Sex: 1945/03/12 (71 y.o. Male) Treating RN: Carolyne Fiscal, Debi Primary Care Latarsha Zani: BABAOFF, MARCUS Other Clinician: Referring Timothea Bodenheimer: BABAOFF, MARCUS Treating Teng Decou/Extender: STONE III, HOYT Weeks in Treatment: 0 Height (in): 74 Weight (lbs): 257 Body Mass Index (BMI): 33 Nutrition Risk Assessment Items NUTRITION RISK SCREEN: I have an illness or condition that made me change the kind and/or 2 Yes amount of food I eat I eat fewer than two meals per day 3 Yes I eat few fruits and vegetables, or milk products 0 No I have three or more drinks of beer, liquor or wine almost every day 0 No I have tooth or mouth problems that make it hard for me to eat 0 No I don't always have enough money to buy the food I need 0 No I eat alone most of the time 0 No I take three or more different prescribed or over-the-counter drugs a 1 Yes day Without wanting to, I have lost or gained 10 pounds in the last six 0 No months I am not always physically able to shop, cook and/or feed myself 0 No Nutrition Protocols Good Risk Protocol Moderate Risk Protocol Electronic Signature(s) Signed: 05/23/2016 4:51:20 PM By: Alric Quan Entered By: Alric Quan on 05/23/2016 10:41:14

## 2016-05-27 NOTE — Discharge Instructions (Signed)
Paracentesis, Care After  Refer to this sheet in the next few weeks. These instructions provide you with information about caring for yourself after your procedure. Your health care provider may also give you more specific instructions. Your treatment has been planned according to current medical practices, but problems sometimes occur. Call your health care provider if you have any problems or questions after your procedure.  What can I expect after the procedure?  After your procedure, it is common to have a small amount of clear fluid coming from the puncture site.  Follow these instructions at home:  · Return to your normal activities as told by your health care provider. Ask your health care provider what activities are safe for you.  · Take over-the-counter and prescription medicines only as told by your health care provider.  · Do not take baths, swim, or use a hot tub until your health care provider approves.  · Follow instructions from your health care provider about:  ? How to take care of your puncture site.  ? When and how you should change your bandage (dressing).  ? When you should remove your dressing.  · Check your puncture area every day signs of infection. Watch for:  ? Redness, swelling, or pain.  ? Fluid, blood, or pus.  · Keep all follow-up visits as told by your health care provider. This is important.  Contact a health care provider if:  · You have redness, swelling, or pain at your puncture site.  · You start to have more clear fluid coming from your puncture site.  · You have blood or pus coming from your puncture site.  · You have chills.  · You have a fever.  Get help right away if:  · You develop chest pain or shortness of breath.  · You develop increasing pain, discomfort, or swelling in your abdomen.  · You feel dizzy or light-headed or you pass out.  This information is not intended to replace advice given to you by your health care provider. Make sure you discuss any questions you  have with your health care provider.  Document Released: 05/30/2014 Document Revised: 06/21/2015 Document Reviewed: 03/28/2014  Elsevier Interactive Patient Education © 2017 [employer]

## 2016-05-29 ENCOUNTER — Other Ambulatory Visit: Payer: Self-pay | Admitting: Internal Medicine

## 2016-05-29 ENCOUNTER — Ambulatory Visit
Admission: RE | Admit: 2016-05-29 | Discharge: 2016-05-29 | Disposition: A | Discharge status: Home / Self Care | Payer: Medicare Other | Source: Ambulatory Visit | Attending: Internal Medicine | Admitting: Internal Medicine

## 2016-05-29 ENCOUNTER — Ambulatory Visit: Payer: Medicare Other

## 2016-05-29 DIAGNOSIS — R161 Splenomegaly, not elsewhere classified: Secondary | ICD-10-CM | POA: Diagnosis not present

## 2016-05-29 DIAGNOSIS — C3412 Malignant neoplasm of upper lobe, left bronchus or lung: Secondary | ICD-10-CM | POA: Diagnosis present

## 2016-05-29 DIAGNOSIS — J90 Pleural effusion, not elsewhere classified: Secondary | ICD-10-CM | POA: Insufficient documentation

## 2016-05-29 DIAGNOSIS — K7460 Unspecified cirrhosis of liver: Secondary | ICD-10-CM | POA: Insufficient documentation

## 2016-05-29 DIAGNOSIS — R188 Other ascites: Secondary | ICD-10-CM | POA: Diagnosis not present

## 2016-05-30 ENCOUNTER — Ambulatory Visit
Admission: RE | Admit: 2016-05-30 | Discharge: 2016-05-30 | Disposition: A | Discharge status: Home / Self Care | Payer: Medicare Other | Source: Ambulatory Visit | Attending: Gastroenterology | Admitting: Gastroenterology

## 2016-05-30 DIAGNOSIS — K7031 Alcoholic cirrhosis of liver with ascites: Secondary | ICD-10-CM | POA: Diagnosis not present

## 2016-05-30 MED ORDER — ALBUMIN HUMAN 25 % IV SOLN
25.0000 g | Freq: Once | INTRAVENOUS | Status: AC
  Administered 2016-05-30: 25 g via INTRAVENOUS
  Filled 2016-05-30: qty 100

## 2016-05-30 NOTE — Procedures (Signed)
US guided paracentesis from RLQ.  Removed 5 liters.  Minimal blood loss and no immediate complication.

## 2016-06-05 ENCOUNTER — Inpatient Hospital Stay: Payer: Medicare Other | Admitting: Internal Medicine

## 2016-06-05 ENCOUNTER — Inpatient Hospital Stay: Payer: Medicare Other

## 2016-06-05 ENCOUNTER — Telehealth: Payer: Self-pay

## 2016-06-05 NOTE — Telephone Encounter (Signed)
Patients wife called and cancelled appt due to patient not being able to walk. Just released from [facility] on last night.  Had surgery on Tuesday.  Wife would like to have test results that they would have received at appointment on today.  Please call.

## 2016-06-05 NOTE — Telephone Encounter (Signed)
Returned phone call to patient's wife. Discussed stable findings of lung cancer. No evidence of recurrent disease.  Pt's wife reports that Patient had surgery at [facility] - "placement of a TIPS" - to help with his portal venous hypertension/cirrhosis. Pt very weak and is unable to get out of bed w/o assist. She states that her health has also declined and she is unable to personally care for her husband's ADLs.  Per wife, needs a home health order as soon as possible. She is going to contact her husband's surgeon to arrange for this. If surgeon doesn't want to write for home health, she will discuss her concerns with pcp.  She also wants to discuss a pleurx cath placement for her husband's ascites. She states that she will talk with the gastric doctors regarding this as her husband is getting weaker at home. She "feels that the pleurx cath may offer another alternative for managing the ascites."  Pt has an apt on 06/11/16 with Dr. Rogue Bussing. Wife gave verbal understanding that the results of the lung scan were stable. She states that she may need to post pone the r/s apt next week if patient's performance status doesn't improve.  She thanked for me for calling her back.

## 2016-06-08 ENCOUNTER — Inpatient Hospital Stay
Admission: EM | Admit: 2016-06-08 | Discharge: 2016-06-09 | DRG: 442 | Disposition: A | Discharge status: Other Acute Inpatient Hospital | Payer: Medicare Other | Attending: Internal Medicine | Admitting: Internal Medicine

## 2016-06-08 ENCOUNTER — Emergency Department: Payer: Medicare Other

## 2016-06-08 ENCOUNTER — Encounter: Payer: Self-pay | Admitting: Emergency Medicine

## 2016-06-08 DIAGNOSIS — Z9221 Personal history of antineoplastic chemotherapy: Secondary | ICD-10-CM

## 2016-06-08 DIAGNOSIS — C3412 Malignant neoplasm of upper lobe, left bronchus or lung: Secondary | ICD-10-CM | POA: Diagnosis present

## 2016-06-08 DIAGNOSIS — Z6833 Body mass index (BMI) 33.0-33.9, adult: Secondary | ICD-10-CM

## 2016-06-08 DIAGNOSIS — N400 Enlarged prostate without lower urinary tract symptoms: Secondary | ICD-10-CM | POA: Diagnosis present

## 2016-06-08 DIAGNOSIS — Z87891 Personal history of nicotine dependence: Secondary | ICD-10-CM

## 2016-06-08 DIAGNOSIS — D696 Thrombocytopenia, unspecified: Secondary | ICD-10-CM | POA: Diagnosis present

## 2016-06-08 DIAGNOSIS — E1136 Type 2 diabetes mellitus with diabetic cataract: Secondary | ICD-10-CM | POA: Diagnosis present

## 2016-06-08 DIAGNOSIS — K7290 Hepatic failure, unspecified without coma: Secondary | ICD-10-CM

## 2016-06-08 DIAGNOSIS — I5032 Chronic diastolic (congestive) heart failure: Secondary | ICD-10-CM | POA: Diagnosis present

## 2016-06-08 DIAGNOSIS — I110 Hypertensive heart disease with heart failure: Secondary | ICD-10-CM | POA: Diagnosis present

## 2016-06-08 DIAGNOSIS — I2510 Atherosclerotic heart disease of native coronary artery without angina pectoris: Secondary | ICD-10-CM | POA: Diagnosis present

## 2016-06-08 DIAGNOSIS — K219 Gastro-esophageal reflux disease without esophagitis: Secondary | ICD-10-CM | POA: Diagnosis present

## 2016-06-08 DIAGNOSIS — K7200 Acute and subacute hepatic failure without coma: Principal | ICD-10-CM | POA: Diagnosis present

## 2016-06-08 DIAGNOSIS — Z8249 Family history of ischemic heart disease and other diseases of the circulatory system: Secondary | ICD-10-CM

## 2016-06-08 DIAGNOSIS — R188 Other ascites: Secondary | ICD-10-CM | POA: Diagnosis not present

## 2016-06-08 DIAGNOSIS — K7460 Unspecified cirrhosis of liver: Secondary | ICD-10-CM | POA: Diagnosis present

## 2016-06-08 DIAGNOSIS — E785 Hyperlipidemia, unspecified: Secondary | ICD-10-CM | POA: Diagnosis present

## 2016-06-08 DIAGNOSIS — K7581 Nonalcoholic steatohepatitis (NASH): Secondary | ICD-10-CM | POA: Diagnosis present

## 2016-06-08 DIAGNOSIS — Z833 Family history of diabetes mellitus: Secondary | ICD-10-CM

## 2016-06-08 DIAGNOSIS — K7682 Hepatic encephalopathy: Secondary | ICD-10-CM | POA: Diagnosis present

## 2016-06-08 DIAGNOSIS — E440 Moderate protein-calorie malnutrition: Secondary | ICD-10-CM | POA: Diagnosis present

## 2016-06-08 DIAGNOSIS — J449 Chronic obstructive pulmonary disease, unspecified: Secondary | ICD-10-CM | POA: Diagnosis present

## 2016-06-08 DIAGNOSIS — Z955 Presence of coronary angioplasty implant and graft: Secondary | ICD-10-CM

## 2016-06-08 LAB — COMPREHENSIVE METABOLIC PANEL
ALK PHOS: 149 U/L — AB (ref 38–126)
ALT: 45 U/L (ref 17–63)
AST: 84 U/L — ABNORMAL HIGH (ref 15–41)
Albumin: 3 g/dL — ABNORMAL LOW (ref 3.5–5.0)
Anion gap: 7 (ref 5–15)
BUN: 16 mg/dL (ref 6–20)
CALCIUM: 8.7 mg/dL — AB (ref 8.9–10.3)
CO2: 22 mmol/L (ref 22–32)
Chloride: 106 mmol/L (ref 101–111)
Creatinine, Ser: 1.13 mg/dL (ref 0.61–1.24)
GFR calc Af Amer: >60 mL/min (ref >60)
GFR calc non Af Amer: >60 mL/min (ref >60)
GLUCOSE: 162 mg/dL — AB (ref 65–99)
Potassium: 3.9 mmol/L (ref 3.5–5.1)
SODIUM: 135 mmol/L (ref 135–145)
Total Bilirubin: 1.5 mg/dL — ABNORMAL HIGH (ref 0.3–1.2)
Total Protein: 6.6 g/dL (ref 6.5–8.1)

## 2016-06-08 LAB — AMMONIA: Ammonia: 131 umol/L — ABNORMAL HIGH (ref 9–35)

## 2016-06-08 LAB — CBC
HCT: 34.8 % — ABNORMAL LOW (ref 40.0–52.0)
Hemoglobin: 11.8 g/dL — ABNORMAL LOW (ref 13.0–18.0)
MCH: 31.4 pg (ref 26.0–34.0)
MCHC: 34 g/dL (ref 32.0–36.0)
MCV: 92.1 fL (ref 80.0–100.0)
PLATELETS: 78 10*3/uL — AB (ref 150–440)
RBC: 3.78 MIL/uL — ABNORMAL LOW (ref 4.40–5.90)
RDW: 18.8 % — ABNORMAL HIGH (ref 11.5–14.5)
WBC: 3 10*3/uL — ABNORMAL LOW (ref 3.8–10.6)

## 2016-06-08 LAB — LIPASE, BLOOD: Lipase: 37 U/L (ref 11–51)

## 2016-06-08 MED ORDER — LACTULOSE 10 GM/15ML PO SOLN
30.0000 g | Freq: Once | ORAL | Status: AC
  Administered 2016-06-08: 30 g via ORAL
  Filled 2016-06-08: qty 60

## 2016-06-08 MED ORDER — CEFTRIAXONE SODIUM IN DEXTROSE 20 MG/ML IV SOLN
1.0000 g | Freq: Once | INTRAVENOUS | Status: AC
  Administered 2016-06-08: 1 g via INTRAVENOUS
  Filled 2016-06-08 (×2): qty 50

## 2016-06-08 NOTE — ED Triage Notes (Signed)
Pt presents to ER from home via [facility], pt reports had a procedure on Tuesday at [place], reports been feeling weak since then. Pt awake alert to self and place did not know month or day. Pt has Insulin pump.

## 2016-06-08 NOTE — ED Notes (Signed)
Dr. Stafford at bedside.  

## 2016-06-08 NOTE — ED Notes (Signed)
Pt tried to void in urinal not successful, changed linen, pt able to move side to side tolerated well

## 2016-06-08 NOTE — ED Notes (Signed)
Pt has dressing to right side of neck, and port cath to right side of chest pt reports shunt able to drain fluid from stomach.

## 2016-06-08 NOTE — ED Provider Notes (Signed)
[facility] Emergency Department Provider Note  ____________________________________________  Time seen: Approximately 10:20 PM  I have reviewed the triage vital signs and the nursing notes.   HISTORY  Chief Complaint Weakness and Abdominal Pain  Level 5 caveat:  Portions of the history and physical were unable to be obtained due to the patient's Altered mental status   HPI Cameron Lea. is a 71 y.o. 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 He was admitted to [facility] on 06/03/2016 and underwent a TIPS procedure,  which was reported to be successful. Patient discharged home in good condition. Patient is not able to me anything about how he's been doing since then. He reports he's been eating half as much but drinking good fluids. He has no idea how many bowel movements today he's having.  He has chronic leukopenia since being treated with chemotherapy for lung cancer in the past.  He reports generalized aching abdominal pain, nonradiating, no aggravating or alleviating factors. Mild.     Past Medical History:  Diagnosis Date  . Ascites   . Azotemia   . Cataracts, both eyes   . CHF (congestive heart failure) ([place])   . Cirrhosis ([place])   . COPD (chronic obstructive pulmonary disease) ([place])   . Detached retina   . Diabetes ([place])   . GI bleed   . History of chemotherapy    finished July 2015-4 cycles of carbo/taxol  . HTN (hypertension)   . Hyperlipidemia   . IDA (iron deficiency anemia)   . Nephrotic range proteinuria   . Pleural effusion   . Renal insufficiency   . 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 Small cell lung cancer ([place])   . Splenomegaly   . Thrombocytopenia [facility])      Patient Active Problem List   Diagnosis  Date Noted  . 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 Malnutrition of moderate degree 05/11/2015  . Cirrhosis of liver with ascites ([place])   . GI bleed 05/09/2015  . 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 Cirrhosis of liver ([place]) 06/20/2014  . COPD (chronic obstructive pulmonary disease) ([place]) 06/20/2014  . CAD (coronary artery disease) 06/20/2014  . Renal azotemia 06/20/2014  . DM (diabetes mellitus) ([place]) 06/20/2014     Past Surgical History:  Procedure Laterality Date  . CARDIAC STENTS    . COLONOSCOPY  12/18/12  . ESOPHAGOGASTRODUODENOSCOPY (EGD) WITH PROPOFOL N/A 05/11/2015   Procedure: ESOPHAGOGASTRODUODENOSCOPY (EGD) WITH PROPOFOL;  Surgeon: Hulen Luster, MD;  Location: [facility] ENDOSCOPY;  Service: Endoscopy;  Laterality: N/A;  . FISTULA REPAIR    . LAMINECTOMY       Prior to Admission medications   Medication Sig Start Date End Date Taking? Authorizing Provider  allopurinol (ZYLOPRIM) 300 MG tablet Take 300 mg by mouth daily.    [provider]  furosemide (LASIX) 40 MG tablet Take by mouth. 11/21/15   [provider]  GENERLAC 10 GM/15ML SOLN TK 30 ML PO BID 05/23/15   [provider]  Insulin Human (INSULIN PUMP) SOLN Pt uses Humalog insulin.    [provider]  lactulose (CHRONULAC) 10 GM/15ML solution Take 30 g by mouth 2 (two) times daily as needed for mild constipation.  [provider]  midodrine (PROAMATINE) 5 MG tablet Take 5 mg by mouth 3 (three) times daily with meals.    [provider]  Multiple Vitamin (MULTIVITAMIN WITH MINERALS) TABS tablet Take 1 tablet by mouth daily.    [provider]  nitroGLYCERIN (NITRODUR - DOSED IN MG/24 HR) 0.4 mg/hr patch Place 0.4 mg onto the skin daily as needed (for chest pain).     [provider]  nitroGLYCERIN (NITROSTAT) 0.4 MG SL tablet Place 0.4 mg under the tongue every 5 (five) minutes as needed for chest pain.     [provider]  pantoprazole (PROTONIX) 40 MG tablet Take 1 tablet (40 mg total) by mouth 2 (two) times daily before a meal. Patient taking differently: Take 40 mg by mouth daily.  05/12/15   Demetrios Loll, MD  polyethylene glycol Scnetx / Floria Raveling) packet Take 17 g by mouth daily as needed for mild constipation.    [provider]  rifaximin (XIFAXAN) 550 MG TABS tablet Take by mouth.    [provider]  spironolactone (ALDACTONE) 100 MG tablet Take 50 mg by mouth once.  09/14/15   [provider]  sucralfate (CARAFATE) 1 g tablet Take 1 tablet (1 g total) by mouth 4 (four) times daily. Patient taking differently: Take 1 g by mouth 2 (two) times daily.  05/12/15   Demetrios Loll, MD  tamsulosin (FLOMAX) 0.4 MG CAPS capsule Take 0.4 mg by mouth daily after breakfast.     [provider]  TANDEM 162-115.2 MG CAPS capsule TAKE 1 CAPSULE BY MOUTH DAILY WITH BREAKFAST 05/29/16   Cammie Sickle, MD     Allergies Tape   Family History  Problem Relation Age of Onset  . Heart attack Mother   . Heart attack Father     Social History Social History  Substance Use Topics  . Smoking status: Former Research scientist (life sciences)  . Smokeless tobacco: Not on file  . Alcohol use No    Review of Systems Unable to obtain due to altered mental status  ____________________________________________   PHYSICAL EXAM:  VITAL SIGNS: ED Triage Vitals  Enc Vitals Group     BP 06/08/16 2129 (!) 152/88     Pulse Rate 06/08/16 2129 89     Resp 06/08/16 2129 20     Temp 06/08/16 2129 98.5 F (36.9 C)     Temp Source 06/08/16 2129 Oral     SpO2 06/08/16 2129 100 %     Weight 06/08/16 2130 250 lb (113.4 kg)     Height 06/08/16 2130 6' (1.829 m)     Head Circumference --      Peak Flow --      Pain Score 06/08/16 2128 8     Pain Loc --      Pain Edu? --      Excl. in [place]? --     Vital signs reviewed, nursing assessments reviewed.   Constitutional:   Alert and orientedTo  self. Not in distress. Eyes:   No scleral icterus. No conjunctival pallor. PERRL. EOMI.  No nystagmus. ENT   Head:   Normocephalic and atraumatic.   Nose:   No congestion/rhinnorhea. No septal hematoma   Mouth/Throat:   Dry mucous membranes, no pharyngeal erythema. No peritonsillar mass.    Neck:   No stridor. No SubQ emphysema. No meningismus. Hematological/Lymphatic/Immunilogical:   No cervical lymphadenopathy. Cardiovascular:   RRR. Symmetric bilateral radial and DP pulses.  No murmurs.  Respiratory:   Normal  respiratory effort without tachypnea nor retractions. Breath sounds are clear and equal bilaterally. No wheezes/rales/rhonchi. Gastrointestinal:   Soft, distended, generalized tenderness. Not rigid. Tympanitic. There is no CVA tenderness.  No rebound, rigidity, or guarding. Genitourinary:   deferred Musculoskeletal:   Normal range of motion in all extremities. No joint effusions.  No lower extremity tenderness.  No edema. Neurologic:   Normal speech, very limited vocabulary range.  CN 2-10 normal. Motor grossly intact. No gross focal neurologic deficits are appreciated.  Skin:    Skin is warm, dry and intact. No rash noted.  No petechiae, purpura, or bullae.  ____________________________________________    LABS (pertinent positives/negatives) (all labs ordered are listed, but only abnormal results are displayed) Labs Reviewed  COMPREHENSIVE METABOLIC PANEL - Abnormal; Notable for the following:       Result Value   Glucose, Bld 162 (*)    Calcium 8.7 (*)    Albumin 3.0 (*)    AST 84 (*)    Alkaline Phosphatase 149 (*)    Total Bilirubin 1.5 (*)    All other components within normal limits  CBC - Abnormal; Notable for the following:    WBC 3.0 (*)    RBC 3.78 (*)    Hemoglobin 11.8 (*)    HCT 34.8 (*)    RDW 18.8 (*)    Platelets 78 (*)    All other components within normal limits  AMMONIA - Abnormal; Notable for the following:    Ammonia 131 (*)    All  other components within normal limits  LIPASE, BLOOD  URINALYSIS, COMPLETE (UACMP) WITH MICROSCOPIC   ____________________________________________   EKG  Interpreted by me Sinus rhythm rate of 88, left axis, normal intervals. Right bundle branch block. Normal ST segments and T waves.  ____________________________________________    RADIOLOGY  Ct Head Wo Contrast  Result Date: 06/08/2016 CLINICAL DATA:  71 y/o M; TIPS procedure 06/03/2016. Confusion and weakness. EXAM: CT HEAD WITHOUT CONTRAST TECHNIQUE: Contiguous axial images were obtained from the base of the skull through the vertex without intravenous contrast. COMPARISON:  Or 12/2015 CT head FINDINGS: Brain: No evidence of acute infarction, hemorrhage, hydrocephalus, extra-axial collection or mass lesion/mass effect. Stable nonspecific foci of hypoattenuation in subcortical and periventricular white matter compatible with mild-to-moderate chronic microvascular ischemic changes. Stable brain parenchymal volume loss. Vascular: Calcific atherosclerosis of cavernous and paraclinoid internal carotid arteries. Skull: Normal. Negative for fracture or focal lesion. Sinuses/Orbits: Visualized paranasal sinuses and mastoid air cells are normally aerated. Left ocular banding. Other: None. IMPRESSION: 1. No acute intracranial abnormality identified. 2. Stable mild-to-moderate chronic microvascular ischemic changes and parenchymal volume loss of the brain. Electronically Signed   By: Kristine Garbe M.D.   On: 06/08/2016 23:16    ____________________________________________   PROCEDURES Procedures  ____________________________________________   INITIAL IMPRESSION / ASSESSMENT AND PLAN / ED COURSE  Pertinent labs & imaging results that were available during my care of the patient were reviewed by me and considered in my medical decision making (see chart for details).  Patient presents with confusion. Obvious ascites. Clinically  does not have SBP, but we'll give her ceftriaxone as prophylaxis given his severe disease. We'll check a abdominal x-ray series as well as CT head, labs, likely admission for hepatic encephalopathy given his recent TIPS procedure. Patient is not septic. Vital signs unremarkable.    ----------------------------------------- 11:21 PM on 06/08/2016 -----------------------------------------  Ammonia 1:30. Lactulose ordered. Hospitalist paged for admission.       ____________________________________________   FINAL  CLINICAL IMPRESSION(S) / ED DIAGNOSES  Final diagnoses:  Acute hepatic encephalopathy  Other ascites      New Prescriptions   No medications on file     Portions of this note were generated with dragon dictation software. Dictation errors may occur despite best attempts at proofreading.    Carrie Mew, MD 06/08/16 [id number]

## 2016-06-09 ENCOUNTER — Ambulatory Visit (HOSPITAL_COMMUNITY)
Admission: AD | Admit: 2016-06-09 | Discharge: 2016-06-09 | Disposition: A | Discharge status: Home / Self Care | Payer: Medicare Other | Source: Other Acute Inpatient Hospital | Attending: Internal Medicine | Admitting: Internal Medicine

## 2016-06-09 ENCOUNTER — Inpatient Hospital Stay: Payer: Medicare Other

## 2016-06-09 DIAGNOSIS — E785 Hyperlipidemia, unspecified: Secondary | ICD-10-CM | POA: Diagnosis present

## 2016-06-09 DIAGNOSIS — K7290 Hepatic failure, unspecified without coma: Secondary | ICD-10-CM | POA: Diagnosis present

## 2016-06-09 DIAGNOSIS — E1136 Type 2 diabetes mellitus with diabetic cataract: Secondary | ICD-10-CM | POA: Diagnosis present

## 2016-06-09 DIAGNOSIS — Z6833 Body mass index (BMI) 33.0-33.9, adult: Secondary | ICD-10-CM | POA: Diagnosis not present

## 2016-06-09 DIAGNOSIS — J449 Chronic obstructive pulmonary disease, unspecified: Secondary | ICD-10-CM | POA: Diagnosis present

## 2016-06-09 DIAGNOSIS — Z87891 Personal history of nicotine dependence: Secondary | ICD-10-CM | POA: Diagnosis not present

## 2016-06-09 DIAGNOSIS — Z955 Presence of coronary angioplasty implant and graft: Secondary | ICD-10-CM | POA: Diagnosis not present

## 2016-06-09 DIAGNOSIS — K7200 Acute and subacute hepatic failure without coma: Secondary | ICD-10-CM | POA: Insufficient documentation

## 2016-06-09 DIAGNOSIS — C3412 Malignant neoplasm of upper lobe, left bronchus or lung: Secondary | ICD-10-CM | POA: Diagnosis present

## 2016-06-09 DIAGNOSIS — K7460 Unspecified cirrhosis of liver: Secondary | ICD-10-CM | POA: Diagnosis present

## 2016-06-09 DIAGNOSIS — D696 Thrombocytopenia, unspecified: Secondary | ICD-10-CM | POA: Diagnosis present

## 2016-06-09 DIAGNOSIS — K219 Gastro-esophageal reflux disease without esophagitis: Secondary | ICD-10-CM | POA: Diagnosis present

## 2016-06-09 DIAGNOSIS — Z9221 Personal history of antineoplastic chemotherapy: Secondary | ICD-10-CM | POA: Diagnosis not present

## 2016-06-09 DIAGNOSIS — N400 Enlarged prostate without lower urinary tract symptoms: Secondary | ICD-10-CM | POA: Diagnosis present

## 2016-06-09 DIAGNOSIS — K7581 Nonalcoholic steatohepatitis (NASH): Secondary | ICD-10-CM | POA: Diagnosis present

## 2016-06-09 DIAGNOSIS — I2510 Atherosclerotic heart disease of native coronary artery without angina pectoris: Secondary | ICD-10-CM | POA: Diagnosis present

## 2016-06-09 DIAGNOSIS — Z833 Family history of diabetes mellitus: Secondary | ICD-10-CM | POA: Diagnosis not present

## 2016-06-09 DIAGNOSIS — E440 Moderate protein-calorie malnutrition: Secondary | ICD-10-CM | POA: Diagnosis present

## 2016-06-09 DIAGNOSIS — R188 Other ascites: Secondary | ICD-10-CM | POA: Diagnosis present

## 2016-06-09 DIAGNOSIS — I110 Hypertensive heart disease with heart failure: Secondary | ICD-10-CM | POA: Diagnosis present

## 2016-06-09 DIAGNOSIS — I5032 Chronic diastolic (congestive) heart failure: Secondary | ICD-10-CM | POA: Diagnosis present

## 2016-06-09 DIAGNOSIS — K7682 Hepatic encephalopathy: Secondary | ICD-10-CM | POA: Diagnosis present

## 2016-06-09 DIAGNOSIS — Z8249 Family history of ischemic heart disease and other diseases of the circulatory system: Secondary | ICD-10-CM | POA: Diagnosis not present

## 2016-06-09 LAB — URINALYSIS, COMPLETE (UACMP) WITH MICROSCOPIC
BACTERIA UA: NONE SEEN
Bilirubin Urine: NEGATIVE
Glucose, UA: NEGATIVE mg/dL
Ketones, ur: NEGATIVE mg/dL
LEUKOCYTES UA: NEGATIVE
Nitrite: NEGATIVE
PROTEIN: NEGATIVE mg/dL
SPECIFIC GRAVITY, URINE: 1.006 (ref 1.005–1.030)
pH: 7 (ref 5.0–8.0)

## 2016-06-09 LAB — AMMONIA: AMMONIA: 75 umol/L — AB (ref 9–35)

## 2016-06-09 LAB — GLUCOSE, CAPILLARY
GLUCOSE-CAPILLARY: 118 mg/dL — AB (ref 65–99)
GLUCOSE-CAPILLARY: 140 mg/dL — AB (ref 65–99)
Glucose-Capillary: 114 mg/dL — ABNORMAL HIGH (ref 65–99)
Glucose-Capillary: 155 mg/dL — ABNORMAL HIGH (ref 65–99)

## 2016-06-09 LAB — PHOSPHORUS: Phosphorus: 2.9 mg/dL (ref 2.5–4.6)

## 2016-06-09 LAB — BASIC METABOLIC PANEL
ANION GAP: 9 (ref 5–15)
BUN: 14 mg/dL (ref 6–20)
CALCIUM: 8.6 mg/dL — AB (ref 8.9–10.3)
CHLORIDE: 106 mmol/L (ref 101–111)
CO2: 22 mmol/L (ref 22–32)
CREATININE: 0.93 mg/dL (ref 0.61–1.24)
GFR calc non Af Amer: >60 mL/min (ref >60)
GLUCOSE: 135 mg/dL — AB (ref 65–99)
Potassium: 3.8 mmol/L (ref 3.5–5.1)
Sodium: 137 mmol/L (ref 135–145)

## 2016-06-09 LAB — CBC
HCT: 32.6 % — ABNORMAL LOW (ref 40.0–52.0)
Hemoglobin: 11.2 g/dL — ABNORMAL LOW (ref 13.0–18.0)
MCH: 31.3 pg (ref 26.0–34.0)
MCHC: 34.3 g/dL (ref 32.0–36.0)
MCV: 91.5 fL (ref 80.0–100.0)
PLATELETS: 73 10*3/uL — AB (ref 150–440)
RBC: 3.57 MIL/uL — AB (ref 4.40–5.90)
RDW: 18.9 % — ABNORMAL HIGH (ref 11.5–14.5)
WBC: 2.7 10*3/uL — AB (ref 3.8–10.6)

## 2016-06-09 LAB — BODY FLUID CELL COUNT WITH DIFFERENTIAL
Eos, Fluid: 0 %
Lymphs, Fluid: 29 %
MONOCYTE-MACROPHAGE-SEROUS FLUID: 61 %
Neutrophil Count, Fluid: 7 %
OTHER CELLS FL: 3 %
Total Nucleated Cell Count, Fluid: 65 cu mm

## 2016-06-09 LAB — MAGNESIUM: Magnesium: 1.6 mg/dL — ABNORMAL LOW (ref 1.7–2.4)

## 2016-06-09 MED ORDER — FERROUS FUM-IRON POLYSACCH 162-115.2 MG PO CAPS: 1.0000 | ORAL_CAPSULE | Freq: Every day | ORAL | Status: DC

## 2016-06-09 MED ORDER — RIFAXIMIN 550 MG PO TABS
550.0000 mg | ORAL_TABLET | Freq: Two times a day (BID) | ORAL | Status: DC
  Administered 2016-06-09 (×2): 550 mg via ORAL
  Filled 2016-06-09 (×2): qty 1

## 2016-06-09 MED ORDER — FERROUS FUMARATE 324 (106 FE) MG PO TABS
1.0000 | ORAL_TABLET | Freq: Every day | ORAL | Status: DC
  Administered 2016-06-09: 106 mg via ORAL
  Filled 2016-06-09: qty 1

## 2016-06-09 MED ORDER — NITROGLYCERIN 0.4 MG/HR TD PT24
0.4000 mg | MEDICATED_PATCH | Freq: Every day | TRANSDERMAL | Status: DC | PRN
  Filled 2016-06-09: qty 1

## 2016-06-09 MED ORDER — ADULT MULTIVITAMIN W/MINERALS CH
1.0000 | ORAL_TABLET | Freq: Every day | ORAL | Status: DC
  Administered 2016-06-09: 09:00:00 1 via ORAL
  Filled 2016-06-09: qty 1

## 2016-06-09 MED ORDER — IPRATROPIUM BROMIDE 0.02 % IN SOLN: 0.5000 mg | Freq: Four times a day (QID) | RESPIRATORY_TRACT | Status: DC | PRN

## 2016-06-09 MED ORDER — SODIUM CHLORIDE 0.9% FLUSH
3.0000 mL | Freq: Two times a day (BID) | INTRAVENOUS | Status: DC
  Administered 2016-06-09 (×2): 3 mL via INTRAVENOUS

## 2016-06-09 MED ORDER — ONDANSETRON HCL 4 MG/2ML IJ SOLN: 4.0000 mg | Freq: Four times a day (QID) | INTRAMUSCULAR | Status: DC | PRN

## 2016-06-09 MED ORDER — LACTULOSE 10 GM/15ML PO SOLN
30.0000 g | Freq: Three times a day (TID) | ORAL | Status: DC | PRN
  Administered 2016-06-09: 14:00:00 30 g via ORAL
  Filled 2016-06-09: qty 60

## 2016-06-09 MED ORDER — TAMSULOSIN HCL 0.4 MG PO CAPS
0.4000 mg | ORAL_CAPSULE | Freq: Every day | ORAL | Status: DC
  Administered 2016-06-09: 09:00:00 0.4 mg via ORAL
  Filled 2016-06-09: qty 1

## 2016-06-09 MED ORDER — FUROSEMIDE 40 MG PO TABS
40.0000 mg | ORAL_TABLET | Freq: Every day | ORAL | Status: DC
  Administered 2016-06-09: 40 mg via ORAL
  Filled 2016-06-09: qty 1

## 2016-06-09 MED ORDER — PSEUDOEPHEDRINE HCL 30 MG PO TABS
30.0000 mg | ORAL_TABLET | ORAL | Status: DC | PRN
  Filled 2016-06-09: qty 1

## 2016-06-09 MED ORDER — ALLOPURINOL 100 MG PO TABS
300.0000 mg | ORAL_TABLET | Freq: Every day | ORAL | Status: DC
  Administered 2016-06-09: 300 mg via ORAL
  Filled 2016-06-09: qty 3

## 2016-06-09 MED ORDER — NITROGLYCERIN 0.4 MG SL SUBL: 0.4000 mg | SUBLINGUAL_TABLET | SUBLINGUAL | Status: DC | PRN

## 2016-06-09 MED ORDER — SODIUM CHLORIDE 0.9 % IV SOLN: 250.0000 mL | INTRAVENOUS | Status: DC | PRN

## 2016-06-09 MED ORDER — PANTOPRAZOLE SODIUM 40 MG PO TBEC
40.0000 mg | DELAYED_RELEASE_TABLET | Freq: Every day | ORAL | Status: DC
  Administered 2016-06-09: 40 mg via ORAL
  Filled 2016-06-09: qty 1

## 2016-06-09 MED ORDER — INSULIN ASPART 100 UNIT/ML ~~LOC~~ SOLN
0.0000 [IU] | SUBCUTANEOUS | Status: DC
  Administered 2016-06-09: 17:00:00 4 [IU] via SUBCUTANEOUS
  Administered 2016-06-09: 3 [IU] via SUBCUTANEOUS
  Filled 2016-06-09: qty 4
  Filled 2016-06-09: qty 3

## 2016-06-09 MED ORDER — POLYETHYLENE GLYCOL 3350 17 G PO PACK: 17.0000 g | PACK | Freq: Every day | ORAL | Status: DC | PRN

## 2016-06-09 MED ORDER — SULFAMETHOXAZOLE-TRIMETHOPRIM 800-160 MG PO TABS
1.0000 | ORAL_TABLET | Freq: Every day | ORAL | Status: DC
  Administered 2016-06-09: 09:00:00 1 via ORAL
  Filled 2016-06-09: qty 1

## 2016-06-09 MED ORDER — MIDODRINE HCL 5 MG PO TABS
5.0000 mg | ORAL_TABLET | Freq: Three times a day (TID) | ORAL | Status: DC
  Filled 2016-06-09: qty 1

## 2016-06-09 MED ORDER — SPIRONOLACTONE 25 MG PO TABS
50.0000 mg | ORAL_TABLET | Freq: Every day | ORAL | Status: DC
  Administered 2016-06-09: 09:00:00 50 mg via ORAL
  Filled 2016-06-09: qty 2

## 2016-06-09 MED ORDER — POLYSACCHARIDE IRON COMPLEX 150 MG PO CAPS
150.0000 mg | ORAL_CAPSULE | Freq: Every day | ORAL | Status: DC
  Administered 2016-06-09: 09:00:00 150 mg via ORAL
  Filled 2016-06-09: qty 1

## 2016-06-09 MED ORDER — BOOST / RESOURCE BREEZE PO LIQD: 1.0000 | Freq: Three times a day (TID) | ORAL | Status: DC

## 2016-06-09 MED ORDER — ALBUTEROL SULFATE (2.5 MG/3ML) 0.083% IN NEBU: 2.5000 mg | INHALATION_SOLUTION | Freq: Four times a day (QID) | RESPIRATORY_TRACT | Status: DC | PRN

## 2016-06-09 MED ORDER — SODIUM CHLORIDE 0.9% FLUSH: 3.0000 mL | INTRAVENOUS | Status: DC | PRN

## 2016-06-09 MED ORDER — ONDANSETRON HCL 4 MG PO TABS: 4.0000 mg | ORAL_TABLET | Freq: Four times a day (QID) | ORAL | Status: DC | PRN

## 2016-06-09 MED ORDER — BOOST / RESOURCE BREEZE PO LIQD: 1.0000 | Freq: Three times a day (TID) | ORAL | 0 refills | Status: AC

## 2016-06-09 NOTE — Progress Notes (Signed)
[facility] transport center called to bed assignment.  Patient going to room 3821.  Report called to Olivia Mackie, RN at [phone] and [facility] contacted for transport.  Family at bedside and aware.  Family taking patient belongings home and was given patient's insulin pump.  Clarise Cruz, RN

## 2016-06-09 NOTE — ED Notes (Signed)
Pt transported to room 102

## 2016-06-09 NOTE — Progress Notes (Signed)
Patient transported to [facility] via [facility].  EMTALA signed.  Family at bedside and took belongings with them.  Clarise Cruz, RN

## 2016-06-09 NOTE — Progress Notes (Signed)
PT Cancellation Note  Patient Details Name: [name]. MRN: [id number] DOB: 1945-07-17   Cancelled Treatment:    Reason Eval/Treat Not Completed: Patient at procedure or test/unavailable (Consult received and chart reviewed.  Patient currently off unit for testing/procedure (paracentesis).  Will re-attempt at later time/date as patient available and medically appropriate.)   Zuria Fosdick H. Owens Shark, PT, DPT, NCS 06/09/16, 2:09 PM [phone]

## 2016-06-09 NOTE — Discharge Summary (Signed)
[place] [facility] [place] NAME: [name]    MR#:  [id number]  DATE OF BIRTH:  Apr 13, 1945  DATE OF ADMISSION:  06/08/2016 ADMITTING PHYSICIAN: Harvie Bridge, DO  DATE OF DISCHARGE: 06/09/2016  PRIMARY CARE PHYSICIAN: Derinda Late, MD    ADMISSION DIAGNOSIS:  Other ascites [R18.8] Acute hepatic encephalopathy [K72.00]  DISCHARGE DIAGNOSIS:  Active Problems:   Hepatic encephalopathy ([place])   SECONDARY DIAGNOSIS:   Past Medical History:  Diagnosis Date  . Ascites   . Azotemia   . Cataracts, both eyes   . CHF (congestive heart failure) ([place])   . Cirrhosis ([place])   . COPD (chronic obstructive pulmonary disease) ([place])   . Detached retina   . Diabetes ([place])   . GI bleed   . History of chemotherapy    finished July 2015-4 cycles of carbo/taxol  . HTN (hypertension)   . Hyperlipidemia   . IDA (iron deficiency anemia)   . Nephrotic range proteinuria   . Pleural effusion   . Renal insufficiency   . 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 Small cell lung cancer ([place])   . Splenomegaly   . Thrombocytopenia [facility])     HOSPITAL COURSE:   This is a 71 y.o. 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 AMS 2/2 hepatic encephalopathy, NASH with cirrhosis - Continue lactulose - Repeat ammonia level in AM still high. - Continue Bactrim, Lasix, spironolactone, rifaximin - GI consult given recent procedure- suggested to do paracentesis. - Pt did not have any BM here, still confused. - Family requested to transfer to [facility].   I called and he is accepted by [facility].  #. History of GERD - Continue Protonix  #. History of CAD - Continue nitro PRN  #. History of BPH - Continue Flomax  #. History of diabetes - Accuchecks q4h with RISS coverage  #. History of pancytopenia, chronic, stable - Monitor  CBC  DISCHARGE CONDITIONS:   Stable.  CONSULTS OBTAINED:  Treatment Team:  Lucilla Lame, MD  DRUG ALLERGIES:   Allergies  Allergen Reactions  . Tape Other (See Comments)    Pt reports that when silk tape is used on his skin it tears the layer of skin off when he tries to remove tape. This happened after his last paracentesis procedure. He indicates he does not want tape used in the future. He says band-aids and tegaderm are OK. - [facility].      DISCHARGE MEDICATIONS:   Current Discharge Medication List    START taking these medications   Details  feeding supplement (BOOST / RESOURCE BREEZE) LIQD Take 1 Container by mouth 3 (three) times daily between meals. Qty: 10 Container, Refills: 0      CONTINUE these medications which have NOT CHANGED   Details  allopurinol (ZYLOPRIM) 300 MG tablet Take 300 mg by mouth daily.    GENERLAC 10 GM/15ML SOLN TK 30 ML PO BID Refills: 3   Associated Diagnoses: Small cell carcinoma of lung, left (HCC)    glucagon (GLUCAGEN) 1 MG SOLR injection Inject 1 mg into the vein once as needed for low blood sugar.    Insulin Human (INSULIN PUMP) SOLN Pt uses Humalog insulin.    lactulose (CHRONULAC) 10 GM/15ML solution Take 30 g by mouth 2 (two) times daily as needed  for mild constipation.    Multiple Vitamin (MULTIVITAMIN WITH MINERALS) TABS tablet Take 1 tablet by mouth daily.    pantoprazole (PROTONIX) 40 MG tablet Take 1 tablet (40 mg total) by mouth 2 (two) times daily before a meal. Qty: 60 tablet, Refills: 0    polyethylene glycol (MIRALAX / GLYCOLAX) packet Take 17 g by mouth daily as needed for mild constipation.    pseudoephedrine (SUDAFED) 30 MG tablet Take 30 mg by mouth every 4 (four) hours as needed for congestion.    rifaximin (XIFAXAN) 550 MG TABS tablet Take by mouth.    sulfamethoxazole-trimethoprim (BACTRIM DS,SEPTRA DS) 800-160 MG tablet Take 1 tablet by mouth daily.    TANDEM 162-115.2 MG CAPS capsule  TAKE 1 CAPSULE BY MOUTH DAILY WITH BREAKFAST Qty: 30 capsule, Refills: 0    furosemide (LASIX) 40 MG tablet Take by mouth.   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    nitroGLYCERIN (NITRODUR - DOSED IN MG/24 HR) 0.4 mg/hr patch Place 0.4 mg onto the skin daily as needed (for chest pain).     nitroGLYCERIN (NITROSTAT) 0.4 MG SL tablet Place 0.4 mg under the tongue every 5 (five) minutes as needed for chest pain.    spironolactone (ALDACTONE) 100 MG tablet Take 50 mg by mouth once.    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 Qty: 120 tablet, Refills: 0    tamsulosin (FLOMAX) 0.4 MG CAPS capsule Take 0.4 mg by mouth daily after breakfast.          DISCHARGE INSTRUCTIONS:   Follow with Gi clinic at [facility].  If you experience worsening of your admission symptoms, develop shortness of breath, life threatening emergency, suicidal or homicidal thoughts you must seek medical attention immediately by calling 911 or calling your MD immediately  if symptoms less severe.  You Must read complete instructions/literature along with all the possible adverse reactions/side effects for all the Medicines you take and that have been prescribed to you. Take any new Medicines after you have completely understood and accept all the possible adverse reactions/side effects.   Please note  You were cared for by a hospitalist during your hospital stay. If you have any questions about your discharge medications or the care you received while you were in the hospital after you are discharged, you can call the unit and asked to speak with the hospitalist on call if the hospitalist that took care of you is not available. Once you are discharged, your primary care  physician will handle any further medical issues. Please note that NO REFILLS for any discharge medications will be authorized once you are discharged, as it is imperative that you return to your primary care physician (or establish a relationship with a primary care physician if you do not have one) for your aftercare needs so that they can reassess your need for medications and monitor your lab values.    Today   CHIEF COMPLAINT:   Chief Complaint  Patient presents with  . Weakness  . Abdominal Pain    HISTORY OF PRESENT ILLNESS:  [name]  is a 71 y.o. 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  Patient was discharged from [facility] 5 days ago following TIPS.  He was doing well at home until 2 days ago when he became more confused, weak and lethargic.  He has been taking rifaximin regularly but not using lactulose as often as needed.  Daughters also report constipation.  He is reportedly eating and drinking and has normal urine output. Patient complains only of low back pain.  Family denies fevers/chills, weakness, dizziness, chest pain, shortness of breath, N/V/C/D, abdominal pain, dysuria/frequency, changes in mental status.    Of note he is taking Bactrim prophylactically. Prior to TIPS he had been undergoin weekly large volume paracentesis of 5-10L. Otherwise there has been no change in status. Patient has been taking medication as prescribed and there has been no recent change in medication or diet.    VITAL SIGNS:  Blood pressure (!) 153/70, pulse 88, temperature 97.5 F (36.4 C), temperature source Oral, resp. rate 18, height '6\' 2"'$  (1.88 m), weight 116.9 kg (257 lb 12.8 oz), SpO2 100 %.  I/O:   Intake/Output Summary (Last 24 hours) at 06/09/16 1408 Last data filed at 06/09/16 0013  Gross per 24 hour  Intake               50 ml   Output                0 ml  Net               50 ml    PHYSICAL EXAMINATION:   GENERAL: 71 y.o.-year-old white male patient, well-developed, well-nourished lying in the bed in no acute distress.  Pleasant and cooperative.  Somnolent HEENT: Head atraumatic, normocephalic. Pupils equal, round, reactive to light and accommodation. No scleral icterus. Extraocular muscles intact. Nares are patent. Oropharynx is clear. Mucus membranes dry. NECK: Supple, full range of motion. No JVD, no bruit heard. No thyroid enlargement, no tenderness, no cervical lymphadenopathy. CHEST: Normal breath sounds bilaterally. No wheezing, rales, rhonchi or crackles. No use of accessory muscles of respiration.  No reproducible chest wall tenderness.  CARDIOVASCULAR: S1, S2 normal. SEM at LSB, RSB. Cap refill <2 seconds. Pulses intact distally.  ABDOMEN: Soft, distended, + fluid wave, nontender. No rebound, guarding, rigidity. Normoactive bowel sounds present in all four quadrants. No organomegaly or mass. EXTREMITIES: No pedal edema, cyanosis, or clubbing. No calf tenderness or Homan's sign.  NEUROLOGIC: The patient is lethargic but arousable.    DATA REVIEW:   CBC  Recent Labs Lab 06/09/16 0531  WBC 2.7*  HGB 11.2*  HCT 32.6*  PLT 73*    Chemistries   Recent Labs Lab 06/08/16 2141 06/09/16 0531  NA 135 137  K 3.9 3.8  CL 106 106  CO2 22 22  GLUCOSE 162* 135*  BUN 16 14  CREATININE 1.13 0.93  CALCIUM 8.7* 8.6*  MG 1.6*  --   AST 84*  --   ALT 45  --   ALKPHOS 149*  --   BILITOT 1.5*  --     Cardiac Enzymes No results for input(s): TROPONINI in the last 168 hours.  Microbiology Results  Results for orders placed or performed during the hospital encounter of 12/17/15  Body fluid culture     Status: None   Collection Time: 12/17/15 11:03 AM  Result Value Ref Range Status   Specimen Description PERITONEAL  Final   Special Requests NONE  Final   Gram Stain   Final    RARE WBC PRESENT,  PREDOMINANTLY MONONUCLEAR NO ORGANISMS SEEN    Culture   Final    No growth aerobically or anaerobically. Performed at [facility]    Report Status 12/20/2015 FINAL  Final    RADIOLOGY:  Ct Head Wo Contrast  Result Date: 06/08/2016 CLINICAL DATA:  71 y/o M; TIPS procedure 06/03/2016. Confusion and weakness. EXAM: CT HEAD WITHOUT CONTRAST TECHNIQUE: Contiguous axial images were obtained from the base of the skull through the vertex without intravenous contrast. COMPARISON:  Or 12/2015 CT head FINDINGS: Brain: No evidence of acute infarction, hemorrhage, hydrocephalus, extra-axial collection or mass lesion/mass effect. Stable nonspecific foci of hypoattenuation in subcortical and periventricular white matter compatible with mild-to-moderate chronic microvascular ischemic changes. Stable brain parenchymal volume loss. Vascular: Calcific atherosclerosis of cavernous and paraclinoid internal carotid arteries. Skull: Normal. Negative for fracture or focal lesion. Sinuses/Orbits: Visualized paranasal sinuses and mastoid air cells are normally aerated. Left ocular banding. Other: None. IMPRESSION: 1. No acute intracranial abnormality identified. 2. Stable mild-to-moderate chronic microvascular ischemic changes and parenchymal volume loss of the brain. Electronically Signed   By: Kristine Garbe M.D.   On: 06/08/2016 23:16   Dg Abdomen Acute W/chest  Result Date: 06/08/2016 CLINICAL DATA:  Abdominal distention.  Recent TIPS.  Confusion. EXAM: DG ABDOMEN ACUTE W/ 1V CHEST COMPARISON:  CT chest abdomen pelvis 05/29/2016 FINDINGS: Unchanged heart size and mediastinal contours. Left suprahilar opacity corresponds to post treatment related changes on CT. Pleuroparenchymal opacity at the right lung base appear similar to prior exam. No definite large volume pleural effusion. No free intra-abdominal air. Large stool burden throughout the colon. No bowel dilatation to suggest obstruction. TIPS in  the right upper quadrant. Pelvic phleboliths. Cholecystectomy clips in the right upper quadrant. No acute osseous abnormality is seen. IMPRESSION: 1. Large stool burden throughout the colon without evidence of obstruction. 2. TIPS in the right upper quadrant. 3. Right basilar pleuroparenchymal opacity, appears grossly similar to prior CT. Posttreatment related change in the left perihilar lung. Electronically Signed   By: Jeb Levering M.D.   On: 06/08/2016 23:37    EKG:   Orders placed or performed during the hospital encounter of 06/08/16  . EKG 12-Lead  . EKG 12-Lead  . ED EKG  . ED EKG  . EKG 12-Lead    Management plans discussed with the patient, family and they are in agreement.  CODE STATUS:     Code Status Orders        Start     Ordered   06/09/16 0257  Full code  Continuous     06/09/16 0257    Code Status History    Date Active Date Inactive Code Status Order ID Comments User Context   05/09/2015  7:01 PM 05/12/2015  6:01 PM Full Code [id number]  Henreitta Leber, MD Inpatient    Advance Directive Documentation     Most Recent Value  Type of Advance Directive  Living will  Pre-existing out of facility DNR order (yellow form or pink MOST form)  -  "MOST" Form in Place?  -      TOTAL TIME TAKING CARE OF THIS PATIENT: 45 minutes.    Vaughan Basta M.D on 06/09/2016 at 2:08 PM  Between 7am to 6pm - Pager - [phone]  After 6pm go to [email or web address] - password [email or web address] [facility]  Office  [phone]  CC: Primary care physician; Derinda Late,  MD   Note: This dictation was prepared with Dragon dictation along with smaller phrase technology. Any transcriptional errors that result from this process are unintentional.

## 2016-06-09 NOTE — Progress Notes (Signed)
Patient has insulin pump connected with insulin infusing. MD notified, order obtained to discontinue insulin pump while admitted inpatient. NPO order clarified and modified to NPO sips with meds. Also , pt voiding incontinent with stat order for u/a from ED, new order for condom catheter obtained.

## 2016-06-09 NOTE — H&P (Signed)
History and Physical   [facility] - [place] @ [facility] Admission History and Physical [employer], D.O.    Patient Name: [name] MR#: [id number] Date of Birth: [birth year] Date of Admission: 06/08/2016  Referring MD/NP/PA: Dr. Joni Fears Primary Care Physician: Derinda Late, MD Patient coming from: Home Outpatient Specialists: [facility]   Chief Complaint:  Chief Complaint  Patient presents with  . Weakness  . Abdominal Pain  Please note the entire history is obtained from the patient's emergency department chart, emergency department provider and the patient's family who is at the bedside. Patient's personal history is limited by encephalopathy/altered mental status.   HPI: [name] is a 71 y.o. 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  Patient was discharged from [place] 5 days ago following TIPS.  He was doing well at home until 2 days ago when he became more confused, weak and lethargic.  He has been taking rifaximin regularly but not using lactulose as often as needed.  Daughters also report constipation.  He is reportedly eating and drinking and has normal urine output. Patient complains only of low back pain.  Family denies fevers/chills, weakness, dizziness, chest pain, shortness of breath, N/V/C/D, abdominal pain, dysuria/frequency, changes in mental status.    Of note he is taking Bactrim prophylactically. Prior to TIPS he had been undergoin weekly large volume paracentesis of 5-10L. Otherwise there has been no change in status. Patient has been taking medication as prescribed and there has been no recent change in medication or diet.   EMS/ED Course: Patient received Rocephin, lactulose..  Review of Systems:  Unable to obtain secondary to altered mental status.    Past Medical History:  Diagnosis Date  .  Ascites   . Azotemia   . Cataracts, both eyes   . CHF (congestive heart failure) ([place])   . Cirrhosis ([place])   . COPD (chronic obstructive pulmonary disease) ([place])   . Detached retina   . Diabetes ([place])   . GI bleed   . History of chemotherapy    finished July 2015-4 cycles of carbo/taxol  . HTN (hypertension)   . Hyperlipidemia   . IDA (iron deficiency anemia)   . Nephrotic range proteinuria   . Pleural effusion   . Renal insufficiency   . 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 Small cell lung cancer ([place])   . Splenomegaly   . Thrombocytopenia ([place])     Past Surgical History:  Procedure Laterality Date  . CARDIAC STENTS    . COLONOSCOPY  12/18/12  . ESOPHAGOGASTRODUODENOSCOPY (EGD) WITH PROPOFOL N/A 05/11/2015   Procedure: ESOPHAGOGASTRODUODENOSCOPY (EGD) WITH PROPOFOL;  Surgeon: Hulen Luster, MD;  Location: [facility] ENDOSCOPY;  Service: Endoscopy;  Laterality: N/A;  . FISTULA REPAIR    . LAMINECTOMY       reports that he has quit smoking. He does not have any smokeless tobacco history on file. He reports that he does not drink alcohol. His drug history is not on file.  Allergies  Allergen Reactions  . Tape Other (See Comments)    Pt reports that when silk tape is used on his skin it tears the layer of skin off when he tries to remove tape. This happened after his last paracentesis procedure. He indicates he does not want tape used in the future. He says  band-aids and tegaderm are OK. - [facility].     Family History   Medical History Relation Name Comments  Coronary artery disease Father    Diabetes Father    Hypertension Father    Coronary artery disease Mother    Diabetes Mother    Hypertension Mother       Prior to Admission medications   Medication Sig Start Date End Date Taking? Authorizing Provider  allopurinol (ZYLOPRIM) 300 MG tablet Take 300 mg by mouth daily.   Yes [provider]  GENERLAC 10 GM/15ML SOLN TK 30 ML  PO BID 05/23/15  Yes [provider]  glucagon (GLUCAGEN) 1 MG SOLR injection Inject 1 mg into the vein once as needed for low blood sugar.   Yes [provider]  Insulin Human (INSULIN PUMP) SOLN Pt uses Humalog insulin.   Yes [provider]  lactulose (CHRONULAC) 10 GM/15ML solution Take 30 g by mouth 2 (two) times daily as needed for mild constipation.   Yes [provider]  Multiple Vitamin (MULTIVITAMIN WITH MINERALS) TABS tablet Take 1 tablet by mouth daily.   Yes [provider]  pantoprazole (PROTONIX) 40 MG tablet Take 1 tablet (40 mg total) by mouth 2 (two) times daily before a meal. Patient taking differently: Take 40 mg by mouth daily.  05/12/15  Yes Demetrios Loll, MD  polyethylene glycol [facility] / Floria Raveling) packet Take 17 g by mouth daily as needed for mild constipation.   Yes [provider]  pseudoephedrine (SUDAFED) 30 MG tablet Take 30 mg by mouth every 4 (four) hours as needed for congestion.   Yes [provider]  rifaximin (XIFAXAN) 550 MG TABS tablet Take by mouth.   Yes [provider]  sulfamethoxazole-trimethoprim (BACTRIM DS,SEPTRA DS) 800-160 MG tablet Take 1 tablet by mouth daily.   Yes [provider]  TANDEM 162-115.2 MG CAPS capsule TAKE 1 CAPSULE BY MOUTH DAILY WITH BREAKFAST 05/29/16  Yes Cammie Sickle, MD  furosemide (LASIX) 40 MG tablet Take by mouth. 11/21/15   [provider]  midodrine (PROAMATINE) 5 MG tablet Take 5 mg by mouth 3 (three) times daily with meals.    [provider]  nitroGLYCERIN (NITRODUR - DOSED IN MG/24 HR) 0.4 mg/hr patch Place 0.4 mg onto the skin daily as needed (for chest pain).     [provider]  nitroGLYCERIN (NITROSTAT) 0.4 MG SL tablet Place 0.4 mg under the tongue every 5 (five) minutes as needed for chest pain.    [provider]  spironolactone (ALDACTONE) 100 MG tablet Take 50 mg by mouth once.  09/14/15    [provider]  sucralfate (CARAFATE) 1 g tablet Take 1 tablet (1 g total) by mouth 4 (four) times daily. Patient not taking: Reported on 06/08/2016 05/12/15   Demetrios Loll, MD  tamsulosin [facility]) 0.4 MG CAPS capsule Take 0.4 mg by mouth daily after breakfast.     [provider]    Physical Exam: Vitals:   06/08/16 2129 06/08/16 2130 06/09/16 0000  BP: (!) 152/88  (!) 142/69  Pulse: 89  86  Resp: 20  (!) 29  Temp: 98.5 F (36.9 C)    TempSrc: Oral    SpO2: 100%  98%  Weight:  113.4 kg (250 lb)   Height:  6' (1.829 m)     GENERAL: 71 y.o.-year-old white male patient, well-developed, well-nourished lying in the bed in no acute distress.  Pleasant and cooperative.  Somnolent HEENT:  Head atraumatic, normocephalic. Pupils equal, round, reactive to light and accommodation. No scleral icterus. Extraocular muscles intact. Nares are patent. Oropharynx is clear. Mucus membranes dry. NECK: Supple, full range of motion. No JVD, no bruit heard. No thyroid enlargement, no tenderness, no cervical lymphadenopathy. CHEST: Normal breath sounds bilaterally. No wheezing, rales, rhonchi or crackles. No use of accessory muscles of respiration.  No reproducible chest wall tenderness.  CARDIOVASCULAR: S1, S2 normal. SEM at LSB, RSB. Cap refill <2 seconds. Pulses intact distally.  ABDOMEN: Soft, distended, + fluid wave, nontender. No rebound, guarding, rigidity. Normoactive bowel sounds present in all four quadrants. No organomegaly or mass. EXTREMITIES: No pedal edema, cyanosis, or clubbing. No calf tenderness or Homan's sign.  NEUROLOGIC: The patient is lethargic but arousable.     Labs on Admission:  CBC:  Recent Labs Lab 06/08/16 2141  WBC 3.0*  HGB 11.8*  HCT 34.8*  MCV 92.1  PLT 78*   Basic Metabolic Panel:  Recent Labs Lab 06/08/16 2141  NA 135  K 3.9  CL 106  CO2 22  GLUCOSE 162*  BUN 16  CREATININE 1.13  CALCIUM 8.7*   GFR: Estimated Creatinine Clearance:  80.2 mL/min (by C-G formula based on SCr of 1.13 mg/dL). Liver Function Tests:  Recent Labs Lab 06/08/16 2141  AST 84*  ALT 45  ALKPHOS 149*  BILITOT 1.5*  PROT 6.6  ALBUMIN 3.0*    Recent Labs Lab 06/08/16 2141  LIPASE 37    Recent Labs Lab 06/08/16 2227  AMMONIA 131*   Coagulation Profile: No results for input(s): INR, PROTIME in the last 168 hours. Cardiac Enzymes: No results for input(s): CKTOTAL, CKMB, CKMBINDEX, TROPONINI in the last 168 hours. BNP (last 3 results) No results for input(s): PROBNP in the last 8760 hours. HbA1C: No results for input(s): HGBA1C in the last 72 hours. CBG: No results for input(s): GLUCAP in the last 168 hours. Lipid Profile: No results for input(s): CHOL, HDL, LDLCALC, TRIG, CHOLHDL, LDLDIRECT in the last 72 hours. Thyroid Function Tests: No results for input(s): TSH, T4TOTAL, FREET4, T3FREE, THYROIDAB in the last 72 hours. Anemia Panel: No results for input(s): VITAMINB12, FOLATE, FERRITIN, TIBC, IRON, RETICCTPCT in the last 72 hours. Urine analysis:    Component Value Date/Time   COLORURINE YELLOW (A) 05/09/2015 1409   APPEARANCEUR CLEAR (A) 05/09/2015 1409   APPEARANCEUR Clear 02/16/2014 0900   LABSPEC 1.009 05/09/2015 1409   LABSPEC 1.013 02/16/2014 0900   PHURINE 6.0 05/09/2015 1409   GLUCOSEU NEGATIVE 05/09/2015 1409   GLUCOSEU Negative 02/16/2014 0900   HGBUR NEGATIVE 05/09/2015 1409   BILIRUBINUR NEGATIVE 05/09/2015 1409   BILIRUBINUR Negative 02/16/2014 0900   KETONESUR NEGATIVE 05/09/2015 1409   PROTEINUR NEGATIVE 05/09/2015 1409   NITRITE NEGATIVE 05/09/2015 1409   LEUKOCYTESUR NEGATIVE 05/09/2015 1409   LEUKOCYTESUR Negative 02/16/2014 0900   Sepsis Labs: '@LABRCNTIP'$ (procalcitonin:4,lacticidven:4) )No results found for this or any previous visit (from the past 240 hour(s)).   Radiological Exams on Admission: Ct Head Wo Contrast  Result Date: 06/08/2016 CLINICAL DATA:  71 y/o M; TIPS procedure 06/03/2016.  Confusion and weakness. EXAM: CT HEAD WITHOUT CONTRAST TECHNIQUE: Contiguous axial images were obtained from the base of the skull through the vertex without intravenous contrast. COMPARISON:  Or 12/2015 CT head FINDINGS: Brain: No evidence of acute infarction, hemorrhage, hydrocephalus, extra-axial collection or mass lesion/mass effect. Stable nonspecific foci of hypoattenuation in subcortical and periventricular white matter compatible with mild-to-moderate chronic microvascular ischemic changes. Stable brain parenchymal volume loss. Vascular: Calcific  atherosclerosis of cavernous and paraclinoid internal carotid arteries. Skull: Normal. Negative for fracture or focal lesion. Sinuses/Orbits: Visualized paranasal sinuses and mastoid air cells are normally aerated. Left ocular banding. Other: None. IMPRESSION: 1. No acute intracranial abnormality identified. 2. Stable mild-to-moderate chronic microvascular ischemic changes and parenchymal volume loss of the brain. Electronically Signed   By: Kristine Garbe M.D.   On: 06/08/2016 23:16   Dg Abdomen Acute W/chest  Result Date: 06/08/2016 CLINICAL DATA:  Abdominal distention.  Recent TIPS.  Confusion. EXAM: DG ABDOMEN ACUTE W/ 1V CHEST COMPARISON:  CT chest abdomen pelvis 05/29/2016 FINDINGS: Unchanged heart size and mediastinal contours. Left suprahilar opacity corresponds to post treatment related changes on CT. Pleuroparenchymal opacity at the right lung base appear similar to prior exam. No definite large volume pleural effusion. No free intra-abdominal air. Large stool burden throughout the colon. No bowel dilatation to suggest obstruction. TIPS in the right upper quadrant. Pelvic phleboliths. Cholecystectomy clips in the right upper quadrant. No acute osseous abnormality is seen. IMPRESSION: 1. Large stool burden throughout the colon without evidence of obstruction. 2. TIPS in the right upper quadrant. 3. Right basilar pleuroparenchymal opacity,  appears grossly similar to prior CT. Posttreatment related change in the left perihilar lung. Electronically Signed   By: Jeb Levering M.D.   On: 06/08/2016 23:37    EKG: Normal sinus rhythm at 88 bpm with leftward axis, RBBB and nonspecific ST-T wave changes.   Assessment/Plan  This is a 71 y.o. 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 AMS 2/2 hepatic encephalopathy, NASH with cirrhosis - Admit to observation - Continue lactulose - Repeat ammonia level in AM - NPO until mental status improves - Continue Bactrim, Lasix, spironolactone, rifaximin - GI consult given recent procedure - Nutrition consult  #. History of GERD - Continue Protonix  #. History of CAD - Continue nitro PRN  #. History of BPH - Continue Flomax  #. History of diabetes - Accuchecks q4h with RISS coverage  #. History of pancytopenia, chronic, stable - Monitor CBC  Admission status: Observation IV Fluids: HL Diet/Nutrition: HH, CC Consults called: GI, PT, Nutrition DVT Px: SCDs and early ambulation. Chemoprophylaxis contraindicated secondary to thrombocytopenia Code Status: Full Code. Confirmed with patient and daughters.  Disposition Plan: To home in 1-2 days  All the records are reviewed and case discussed with ED provider. Management plans discussed with the patient and/or family who express understanding and agree with plan of care.  Averey Trompeter D.O. on 06/09/2016 at 12:52 AM Between 7am to 6pm - Pager - [phone] After 6pm go to [email or web address] - Proofreader [facility] [place] [facility] Office [phone] CC: Primary care physician; Derinda Late, MD   06/09/2016, 12:52 AM

## 2016-06-09 NOTE — Progress Notes (Signed)
Initial Nutrition Assessment  DOCUMENTATION CODES:   Non-severe (moderate) malnutrition in context of chronic illness  INTERVENTION:  Provide Boost Breeze po TID, each supplement provides 250 kcal and 9 grams of protein.  Recommend changing to Ensure Enlive po TID when diet advanced past clear liquids.  NUTRITION DIAGNOSIS:   Malnutrition (Moderate) related to chronic illness (ascites, NASH, hx cancer) as evidenced by moderate depletion of body fat, severe depletion of body fat, moderate depletions of muscle mass, severe depletion of muscle mass.  GOAL:   Patient will meet greater than or equal to 90% of their needs  MONITOR:   PO intake, Supplement acceptance, Diet advancement, Labs, Weight trends, I & O's  REASON FOR ASSESSMENT:   Consult Assessment of nutrition requirement/status  ASSESSMENT:   71 year old male with PMHx of CHF, COPD, DM type 2, HTN, pleural effusion, HLD, small cell lung cancer s/p 4 cycles chemotherapy July 2015, ascites secondary to NASH s/p TIPS at [facility] on 06/03/2016, CKD presents with confusion, weakness, lethargy. Found to have AMS secondary to hepatic encephalopathy (per family not using lactulose as often as needed).   -Per chart patient usually uses an insulin pump at home. Has been removed while admitted inpatient to be sent home with family. -Per HPI MD note NPO until mental status improves. After assessment patient was advanced to CLD.  Spoke with patient at bedside. He is a poor historian due to confusion. No family at bedside. He reports his appetite was pretty good PTA but "not perfect." Patient unsure his appetite has been like this. He reports he has been finishing about 50% of meals, but unsure for how long. He does report drinking Ensure 2-3 times per day. Unsure of exactly which kind, but reports it has "a lot of protein." Patient unsure of weight trend but reports he does not believe he is losing weight. He reports he does not ambulate on his  own, but unable to report for how long.  Patient was 264.5 lbs on 06/03/2016. He has lost 6.7 lbs (2.5% body weight) over the past week, which is likely mainly fluid losses. Current weight likely falsely elevated with fluid. Per chart patient was previously 215-220 lbs in 2017 and has gained weight (likely related to his ascites).  Medications reviewed and include: allopurinol, Lasix 40 mg daily, Novolog sliding scale Q4hrs, MVI daily, pantoprazole, Lactulose PRN.  Labs reviewed: CBG 114-118, Ammonia 75 (trending down from 131).  Nutrition-Focused physical exam completed. Findings are moderate-severe fat depletion, moderate-severe muscle depletion, and severe edema. Abdomen distended. Hair dull and dry. Some level of muscle wasting in lower extremities expected with limited ambulation, but would not explain level of fat wasting.  Discussed with RN. Patient confused. Reports patient did find with swallowing medications this AM.   Diet Order:  Diet clear liquid Room service appropriate? Yes; Fluid consistency: Thin  Skin:  Reviewed, no issues  Last BM:  Unknown  Height:   Ht Readings from Last 1 Encounters:  06/09/16 '6\' 2"'$  (1.88 m)    Weight:   Wt Readings from Last 1 Encounters:  06/09/16 257 lb 12.8 oz (116.9 kg)    Ideal Body Weight:  86.4 kg  BMI:  Body mass index is 33.1 kg/m.  Estimated Nutritional Needs:   Kcal:  2025-2395 (MSJ x 1.1-1.3)  Protein:  95-115 grams (0.8-1 grams/kg)  Fluid:  2 L/day  EDUCATION NEEDS:   Education needs no appropriate at this time  Willey Blade, MS, RD, LDN Pager: [phone] After  Hours Pager: [phone]

## 2016-06-09 NOTE — Progress Notes (Signed)
I had seen the pt in morning. Later at noon time- his wife and daughter arrived in hospital, after talking to me- they requested to transfer to [facility] as his care is established there. I called [facility] and pt is accepted there.

## 2016-06-09 NOTE — ED Notes (Signed)
Pt repositioned and asked if patient needs to be changed and stated he did not.

## 2016-06-09 NOTE — Care Management Note (Signed)
Case Management Note  Patient Details  Name: [name]. MRN: [id number] Date of Birth: May 23, 1945  Subjective/Objective:       Admitted to this facility with the diagnosis of hepatic encephalopathy under observation status. Lives with wife, Angela Nevin [phone]). Seen Dr. Gwynneth Aliment this year. Home Health per [facility] in the past. No skilled facility. No home oxygen. Rolling Walker, wheelchair, and raised toilet seat in the home. Self feed, doesn't drive.  Wife helps with dressing and baths, [place] in the past, but didn't remember when. Good appetite. Prescriptions are filled at [facility]. Insulin pump in place. Foley placed. Discharged from [facility] 6 days ago.  Worked in [employer] prior to retirement. Wife will transport.                Action/Plan:  NPO GI consult.   Expected Discharge Date:                  Expected Discharge Plan:     In-House Referral:     Discharge planning Services     Post Acute Care Choice:    Choice offered to:     DME Arranged:    DME Agency:     HH Arranged:    HH Agency:     Status of Service:     If discussed at [employer], dates discussed:    Additional Comments:    Will continue to follow for discharge needs.  Shelbie Ammons, RN MSN CCM Care Management [phone] 06/09/2016, 8:39 AM

## 2016-06-09 NOTE — Consult Note (Signed)
Cameron Lame, MD [facility]  [address] [place], [place] [address] Phone: [phone] Fax : [phone]  Consultation  Referring Provider:     Dr. Ara Kussmaul Primary Care Physician:  Derinda Late, MD Primary Gastroenterologist:  Hepatologist at [facility]         Reason for Consultation:     Hepatic encephalopathy  Date of Admission:  06/08/2016 Date of Consultation:  06/09/2016         HPI:   Cameron Younge. is a 71 y.o. male who has a history of cirrhosis with recurrent ascites requiring weekly taps. The patient was then sent for a TIPS procedure. The patient had this procedure 5 days ago and it appears that the wife had spoken to the cancer center about the patient's lung cancer and had reported that her husband was unable to get out of bed. The patient is not able to give any history and is not sure of his own name the year and thinks he is at [facility]. The patient is alert but confused and denies any abdominal pain. Now being asked to see the patient for worsening hepatic encephalopathy. 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 The patient's hemoglobin this morning was 11.2. He has been thrombocytopenic for some time with his most recent elective 61.  Past Medical History:  Diagnosis Date  . Ascites   . Azotemia   . Cataracts, both eyes   . CHF (congestive heart failure) ([place])   . Cirrhosis ([place])   . COPD (chronic obstructive pulmonary disease) ([place])   . Detached retina   . Diabetes ([place])   . GI bleed   . History of chemotherapy    finished July 2015-4 cycles of carbo/taxol  . HTN (hypertension)   . Hyperlipidemia   . IDA (iron deficiency anemia)   . Nephrotic range proteinuria   . Pleural effusion   . Renal insufficiency   . 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 Small cell lung cancer ([place])   . Splenomegaly   . Thrombocytopenia ([place])     Past Surgical History:  Procedure Laterality Date  . CARDIAC  STENTS    . COLONOSCOPY  12/18/12  . ESOPHAGOGASTRODUODENOSCOPY (EGD) WITH PROPOFOL N/A 05/11/2015   Procedure: ESOPHAGOGASTRODUODENOSCOPY (EGD) WITH PROPOFOL;  Surgeon: Hulen Luster, MD;  Location: [facility] ENDOSCOPY;  Service: Endoscopy;  Laterality: N/A;  . FISTULA REPAIR    . LAMINECTOMY      Prior to Admission medications   Medication Sig Start Date End Date Taking? Authorizing Provider  allopurinol (ZYLOPRIM) 300 MG tablet Take 300 mg by mouth daily.   Yes [provider]  GENERLAC 10 GM/15ML SOLN TK 30 ML PO BID 05/23/15  Yes [provider]  glucagon (GLUCAGEN) 1 MG SOLR injection Inject 1 mg into the vein once as needed for low blood sugar.   Yes [provider]  Insulin Human (INSULIN PUMP) SOLN Pt uses Humalog insulin.   Yes [provider]  lactulose (CHRONULAC) 10 GM/15ML solution Take 30 g by mouth 2 (two) times daily as needed for mild constipation.   Yes [provider]  Multiple Vitamin (MULTIVITAMIN WITH MINERALS) TABS tablet Take 1 tablet by mouth daily.   Yes [provider]  pantoprazole (PROTONIX) 40 MG tablet Take 1 tablet (40 mg total) by mouth 2 (two) times daily before a meal. Patient taking differently: Take 40 mg by mouth daily.  05/12/15  Yes  Demetrios Loll, MD  polyethylene glycol [facility] / Floria Raveling) packet Take 17 g by mouth daily as needed for mild constipation.   Yes [provider]  pseudoephedrine (SUDAFED) 30 MG tablet Take 30 mg by mouth every 4 (four) hours as needed for congestion.   Yes [provider]  rifaximin (XIFAXAN) 550 MG TABS tablet Take by mouth.   Yes [provider]  sulfamethoxazole-trimethoprim (BACTRIM DS,SEPTRA DS) 800-160 MG tablet Take 1 tablet by mouth daily.   Yes [provider]  TANDEM 162-115.2 MG CAPS capsule TAKE 1 CAPSULE BY MOUTH DAILY WITH BREAKFAST 05/29/16  Yes Cammie Sickle, MD  furosemide (LASIX) 40 MG tablet Take by mouth. 11/21/15    [provider]  midodrine (PROAMATINE) 5 MG tablet Take 5 mg by mouth 3 (three) times daily with meals.    [provider]  nitroGLYCERIN (NITRODUR - DOSED IN MG/24 HR) 0.4 mg/hr patch Place 0.4 mg onto the skin daily as needed (for chest pain).     [provider]  nitroGLYCERIN (NITROSTAT) 0.4 MG SL tablet Place 0.4 mg under the tongue every 5 (five) minutes as needed for chest pain.    [provider]  spironolactone (ALDACTONE) 100 MG tablet Take 50 mg by mouth once.  09/14/15   [provider]  sucralfate (CARAFATE) 1 g tablet Take 1 tablet (1 g total) by mouth 4 (four) times daily. Patient not taking: Reported on 06/08/2016 05/12/15   Demetrios Loll, MD  tamsulosin (FLOMAX) 0.4 MG CAPS capsule Take 0.4 mg by mouth daily after breakfast.     [provider]    Family History  Problem Relation Age of Onset  . Heart attack Mother   . Heart attack Father      Social History  Substance Use Topics  . Smoking status: Former Research scientist (life sciences)  . Smokeless tobacco: Never Used  . Alcohol use No    Allergies as of 06/08/2016 - Review Complete 06/08/2016  Allergen Reaction Noted  . Tape Other (See Comments) 09/04/2015    Review of Systems:    All systems reviewed and negative except where noted in HPI.   Physical Exam:  Vital signs in last 24 hours: Temp:  [97.6 F (36.4 C)-98.5 F (36.9 C)] 97.6 F (36.4 C) (05/14 0909) Pulse Rate:  [86-90] 86 (05/14 0909) Resp:  [20-29] 20 (05/14 0407) BP: (142-160)/(51-88) 146/58 (05/14 0909) SpO2:  [97 %-100 %] 100 % (05/14 0909) Weight:  [250 lb (113.4 kg)-257 lb 12.8 oz (116.9 kg)] 257 lb 12.8 oz (116.9 kg) (05/14 0220)   General:   Pleasant, cooperative in NAD Head:  Normocephalic and atraumatic. Eyes:   No icterus.   Conjunctiva pink. PERRLA. Ears:  Normal auditory acuity. Neck:  Supple; no masses or thyroidomegaly Lungs: Respirations even and unlabored. Lungs clear to auscultation bilaterally.    No wheezes, crackles, or rhonchi.  Heart:  Regular rate and rhythm;  Without murmur, clicks, rubs or gallops Abdomen:  Soft, nondistended, nontender. Normal bowel sounds. No appreciable masses or hepatomegaly.  No rebound or guarding.  Rectal:  Not performed. Msk:  Symmetrical without gross deformities.    Extremities:  Without edema, cyanosis or clubbing. Neurologic:  Alert and not oriented x3;  grossly normal neurologically. Skin:  Intact without significant lesions or rashes. Cervical Nodes:  No significant cervical adenopathy. Psych:  Alert and cooperative.   LAB RESULTS:  Recent Labs  06/08/16 2141 06/09/16 0531  WBC 3.0* 2.7*  HGB 11.8* 11.2*  HCT  34.8* 32.6*  PLT 78* 73*   BMET  Recent Labs  06/08/16 2141 06/09/16 0531  NA 135 137  K 3.9 3.8  CL 106 106  CO2 22 22  GLUCOSE 162* 135*  BUN 16 14  CREATININE 1.13 0.93  CALCIUM 8.7* 8.6*   LFT  Recent Labs  06/08/16 2141  PROT 6.6  ALBUMIN 3.0*  AST 84*  ALT 45  ALKPHOS 149*  BILITOT 1.5*   PT/INR No results for input(s): LABPROT, INR in the last 72 hours.  STUDIES: Ct Head Wo Contrast  Result Date: 06/08/2016 CLINICAL DATA:  71 y/o M; TIPS procedure 06/03/2016. Confusion and weakness. EXAM: CT HEAD WITHOUT CONTRAST TECHNIQUE: Contiguous axial images were obtained from the base of the skull through the vertex without intravenous contrast. COMPARISON:  Or 12/2015 CT head FINDINGS: Brain: No evidence of acute infarction, hemorrhage, hydrocephalus, extra-axial collection or mass lesion/mass effect. Stable nonspecific foci of hypoattenuation in subcortical and periventricular white matter compatible with mild-to-moderate chronic microvascular ischemic changes. Stable brain parenchymal volume loss. Vascular: Calcific atherosclerosis of cavernous and paraclinoid internal carotid arteries. Skull: Normal. Negative for fracture or focal lesion. Sinuses/Orbits: Visualized paranasal sinuses and mastoid air cells are  normally aerated. Left ocular banding. Other: None. IMPRESSION: 1. No acute intracranial abnormality identified. 2. Stable mild-to-moderate chronic microvascular ischemic changes and parenchymal volume loss of the brain. Electronically Signed   By: Kristine Garbe M.D.   On: 06/08/2016 23:16   Dg Abdomen Acute W/chest  Result Date: 06/08/2016 CLINICAL DATA:  Abdominal distention.  Recent TIPS.  Confusion. EXAM: DG ABDOMEN ACUTE W/ 1V CHEST COMPARISON:  CT chest abdomen pelvis 05/29/2016 FINDINGS: Unchanged heart size and mediastinal contours. Left suprahilar opacity corresponds to post treatment related changes on CT. Pleuroparenchymal opacity at the right lung base appear similar to prior exam. No definite large volume pleural effusion. No free intra-abdominal air. Large stool burden throughout the colon. No bowel dilatation to suggest obstruction. TIPS in the right upper quadrant. Pelvic phleboliths. Cholecystectomy clips in the right upper quadrant. No acute osseous abnormality is seen. IMPRESSION: 1. Large stool burden throughout the colon without evidence of obstruction. 2. TIPS in the right upper quadrant. 3. Right basilar pleuroparenchymal opacity, appears grossly similar to prior CT. Posttreatment related change in the left perihilar lung. Electronically Signed   By: Jeb Levering M.D.   On: 06/08/2016 23:37      Impression / Plan:   Deren Degrazia. is a 71 y.o. y/o male with A recent TIPS procedure with worsening of hepatic encephalopathy. This is a common side effect of bypassing the liver by doing a TIPS procedure. Other possibilities are that he has a GI bleed although this is unlikely as a cause of his hepatic encephalopathy since his hemoglobin is stable. Other possibilities are any sort of infection which does not appear to be the case in this patient although SBP can also cause worsening of his hepatic encephalopathy. The patient will be set up for a ultrasound guided  paracentesis with checking the fluid for infection. Right now the treatment should include standard treatment for hepatic encephalopathy including Xifaxan and lactulose. The lactulose should be titrated to 2 soft bowel movements a day at least. Another option is occluding the TIPS procedure to improve his hepatic encephalopathy although this would likely result in his ascites coming back.   Thank you for involving me in the care of this patient.      LOS: 0 days   Cameron Lame,  MD  06/09/2016, 12:35 PM   Note: This dictation was prepared with Dragon dictation along with smaller phrase technology. Any transcriptional errors that result from this process are unintentional.

## 2016-06-09 NOTE — Progress Notes (Signed)
Insulin pump removed per MD order. Catheter intact. Placed in biohazard bag and labeled with pt label. Pt instructed to send home with family. Also notified oncoming RN to send insulin pump home with family and note in the chart at that time.

## 2016-06-09 NOTE — Care Management Obs Status (Signed)
[place] NOTIFICATION   Patient Details  Name: [name]. MRN: [id number] Date of Birth: 01-11-1946   Medicare Observation Status Notification Given:  Yes    Shelbie Ammons, RN 06/09/2016, 8:46 AM

## 2016-06-09 NOTE — Progress Notes (Signed)
[facility]  Patient Status: Active   [facility] is providing the following services: SN/PT/OT/HHA  If patient discharges after hours, please call [phone].   [name] 06/09/2016, 11:42 AM

## 2016-06-10 ENCOUNTER — Telehealth: Payer: Self-pay | Admitting: *Deleted

## 2016-06-10 NOTE — Telephone Encounter (Signed)
Noted. Will r/s when pt is able to do so.

## 2016-06-10 NOTE — Telephone Encounter (Signed)
-----   Message from Elouise Munroe sent at 06/10/2016  9:56 AM EDT ----- Contact: [phone] Angela Nevin (wife) called this AM and he is back at [facility]-will call us-canceled his 06-11-16-thx

## 2016-06-11 ENCOUNTER — Ambulatory Visit: Payer: Medicare Other | Admitting: Internal Medicine

## 2016-06-11 ENCOUNTER — Other Ambulatory Visit: Payer: Medicare Other

## 2016-06-13 LAB — BODY FLUID CULTURE: CULTURE: NO GROWTH

## 2016-06-18 ENCOUNTER — Encounter: Payer: Self-pay | Admitting: Internal Medicine

## 2016-10-27 DEATH — deceased

## 2017-08-05 IMAGING — US US PARACENTESIS
1 series · 5 of 5 positions shown · non-contrast
Comparison: none

INDICATION: Recurrent ascites.

[Series 1: us paracentesis · 0.38mm/px · 5 of 5 slices shown]
[im 1/5]
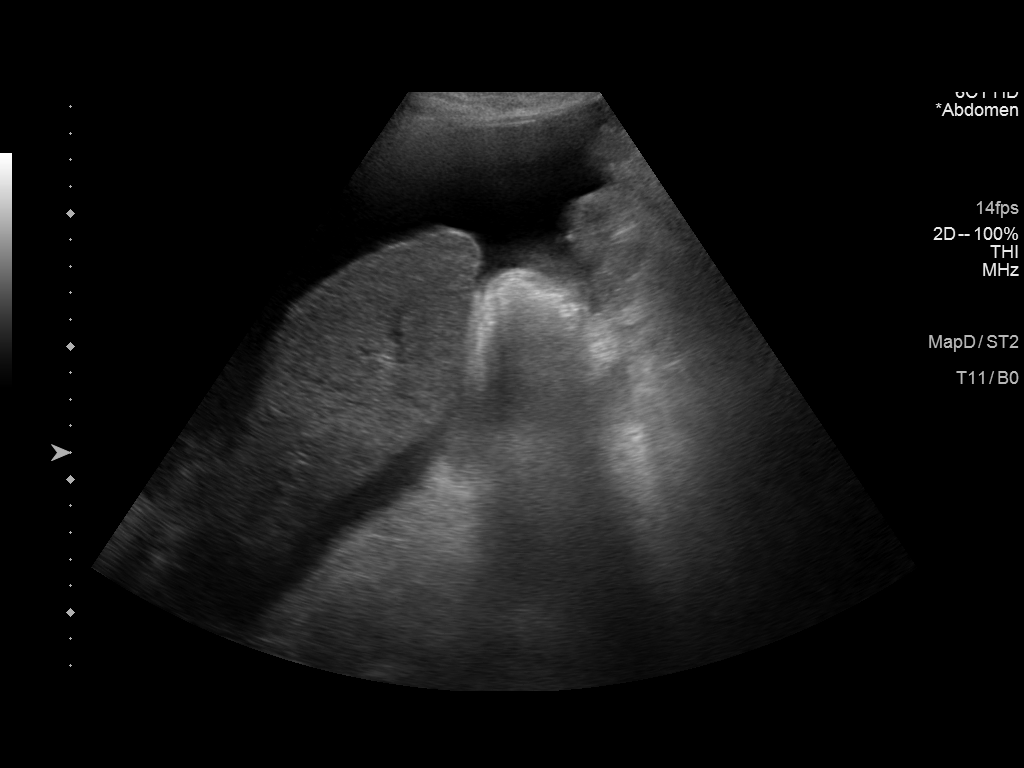
[im 2/5]
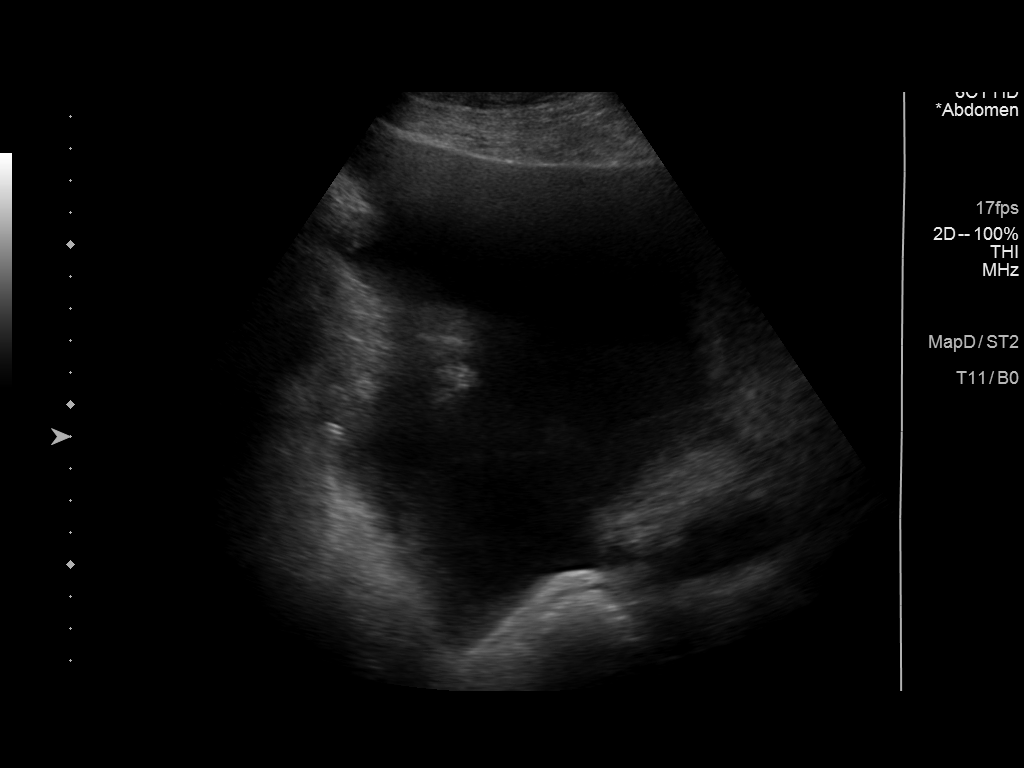
[im 3/5]
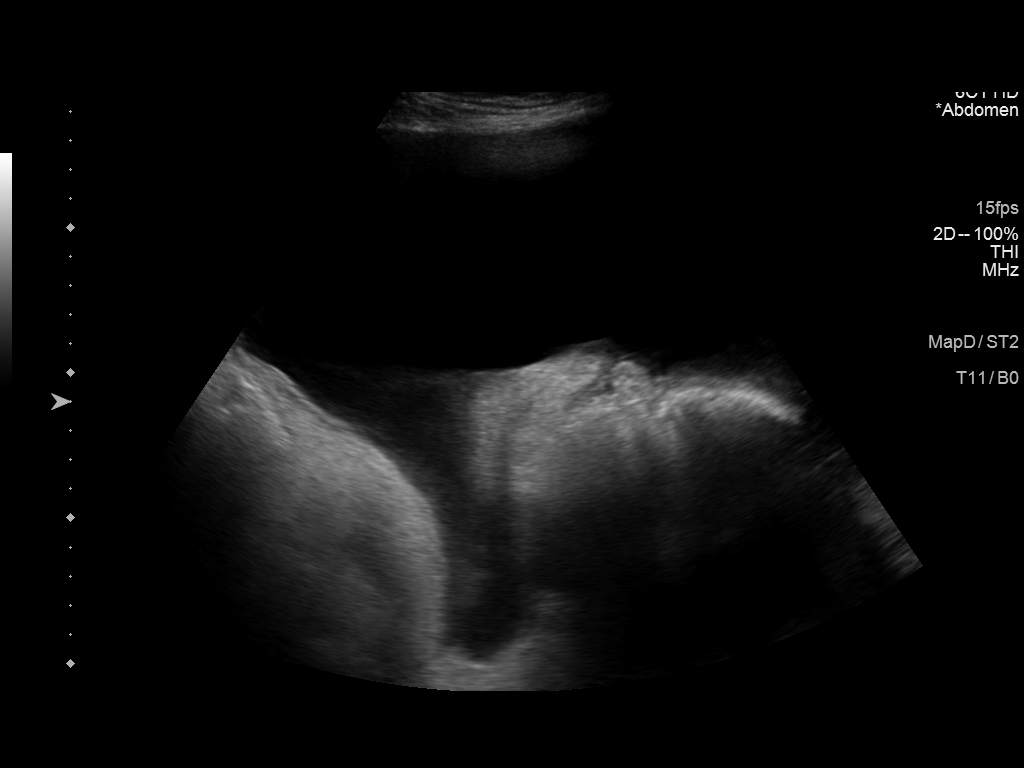
[im 4/5]
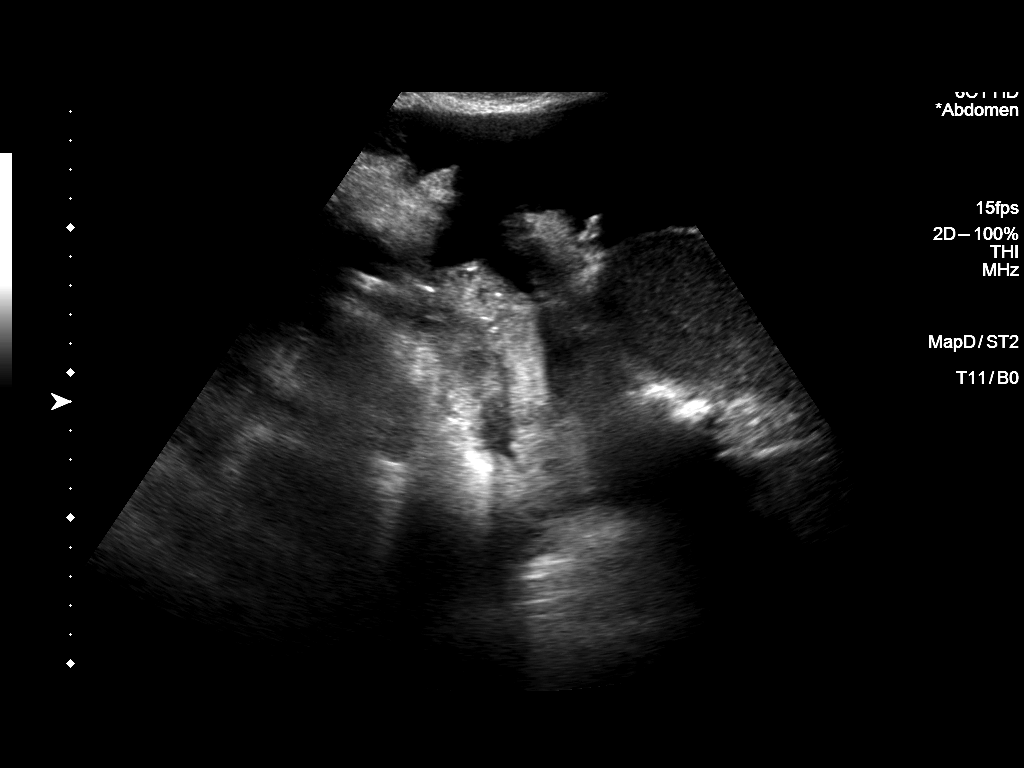
[im 5/5]
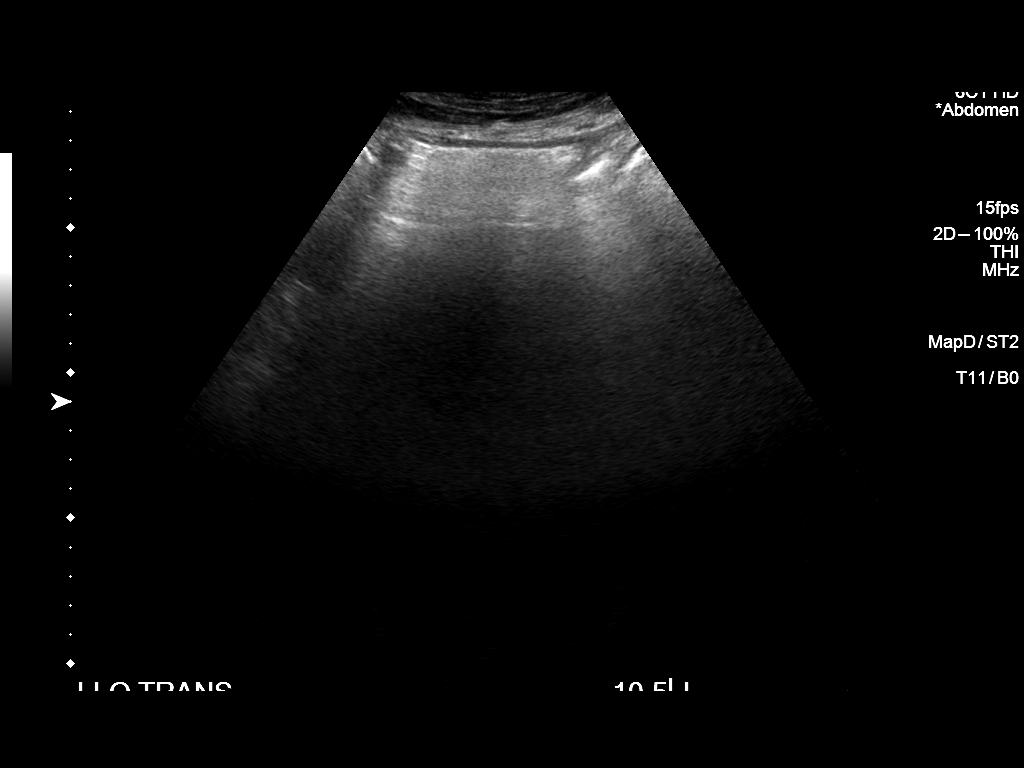

[5 of 5 positions shown; findings below may reference images not displayed]

EXAM:
ULTRASOUND GUIDED PARACENTESIS

MEDICATIONS:
None.

COMPLICATIONS:
None immediate.

PROCEDURE:
Informed written consent was obtained from the patient after a
discussion of the risks, benefits and alternatives to treatment. A
timeout was performed prior to the initiation of the procedure.

Initial ultrasound scanning demonstrates a large amount of ascites
within the left lower abdominal quadrant. The left lower abdomen was
prepped and draped in the usual sterile fashion. 1% lidocaine was
used for local anesthesia.

Following this, a 6 Fr Safe-T-Centesis catheter was introduced. An
ultrasound image was saved for documentation purposes. The
paracentesis was performed. The catheter was removed and a dressing
was applied. The patient tolerated the procedure well without
immediate post procedural complication.
FINDINGS: A total of approximately 10.5 L of yellow fluid was removed.
IMPRESSION: Successful ultrasound-guided paracentesis yielding 10.5 liters of
peritoneal fluid.

## 2017-10-06 IMAGING — CT CT CHEST W/O CM
1 of 2 series · 14 of 31 positions shown, 18 images · non-contrast
Comparison: 11/30/2015

CLINICAL DATA: Cirrhosis

EXAM:
CT CHEST, ABDOMEN AND PELVIS WITHOUT CONTRAST
TECHNIQUE: Multidetector CT imaging of the chest, abdomen and pelvis was
performed following the standard protocol without IV contrast.

[Series 2: axial st · axial · 0.98mm/px · z∈[-1054,-400]mm · 14 of 155 slices shown, 18 images]
[im 12/155  mediastinal]
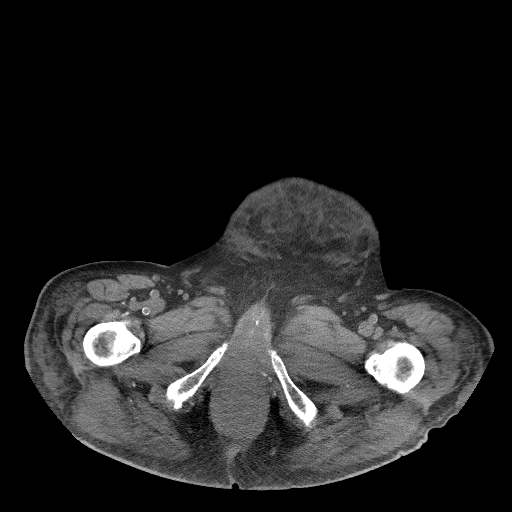
[im 12/155  lung]
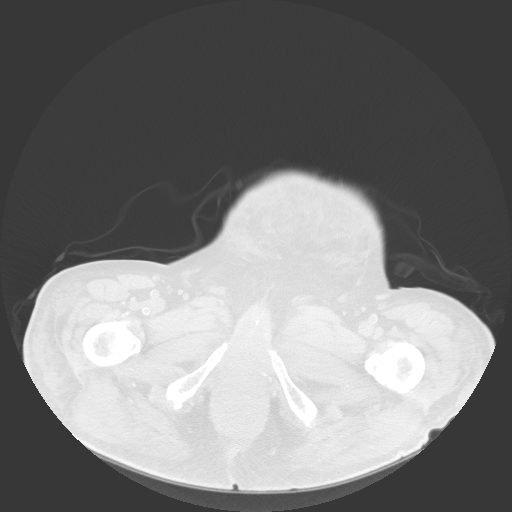
[im 24/155  lung]
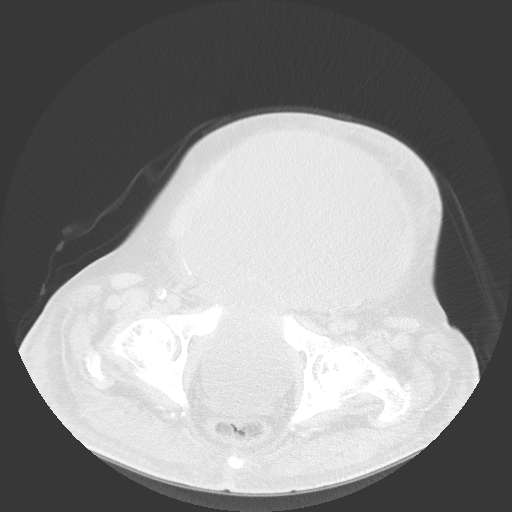
[im 36/155  lung]
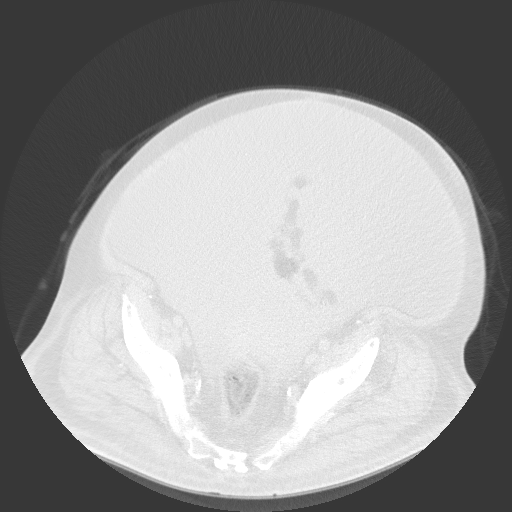
[im 48/155  lung]
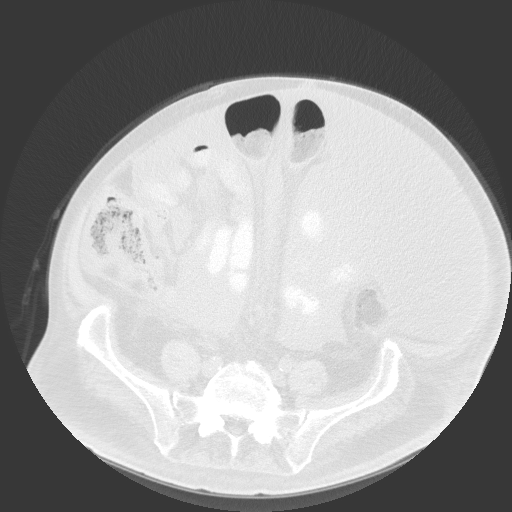
[im 60/155  mediastinal]
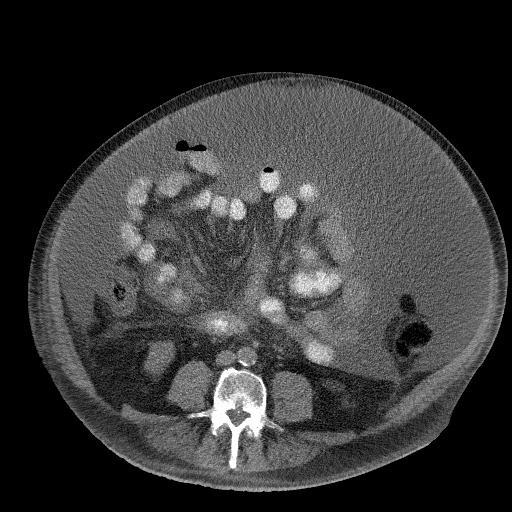
[im 60/155  lung]
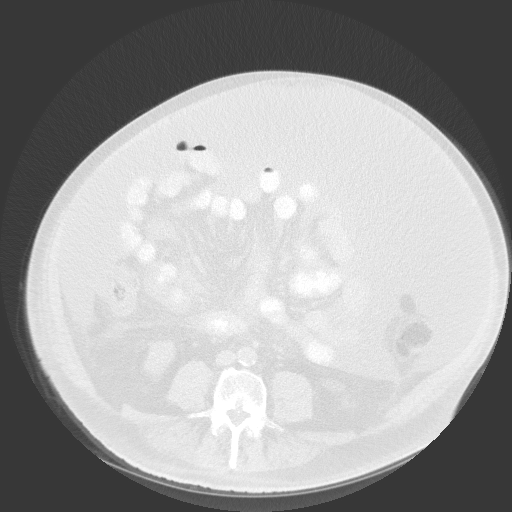
[im 72/155  lung]
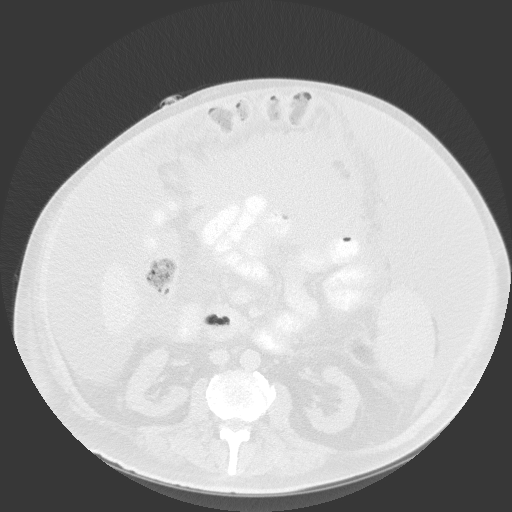
[im 76/155  lung]
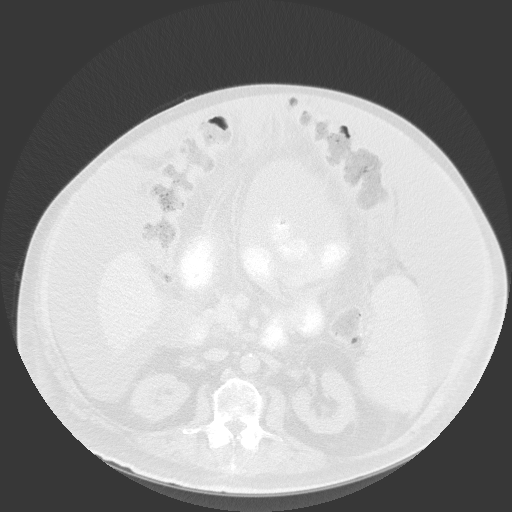
[im 78/155  lung]
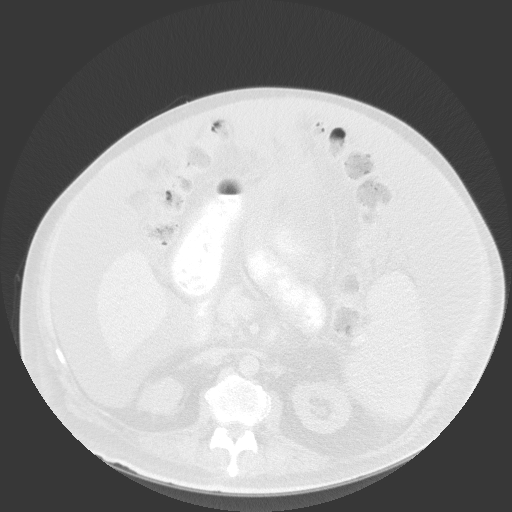
[im 83/155  mediastinal]
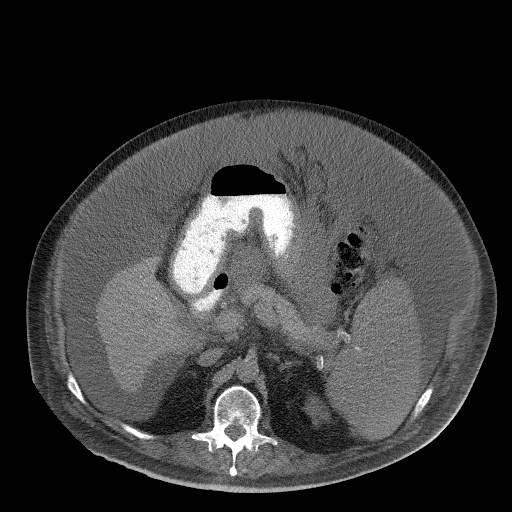
[im 83/155  lung]
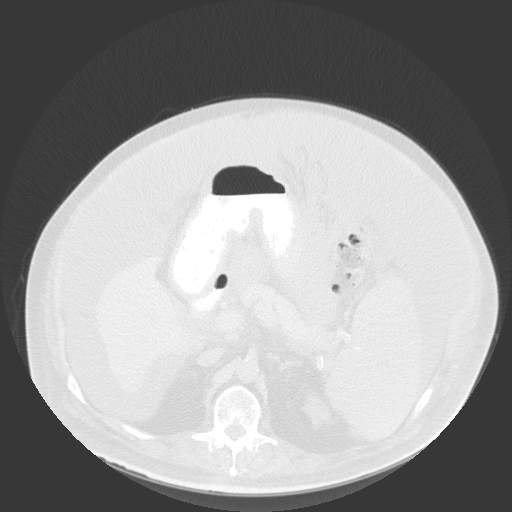
[im 95/155  lung]
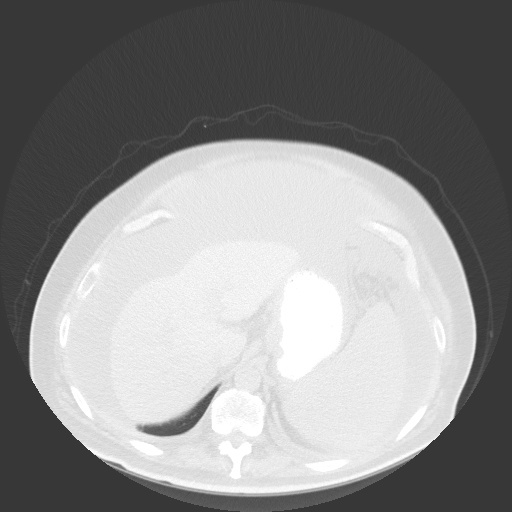
[im 107/155  lung]
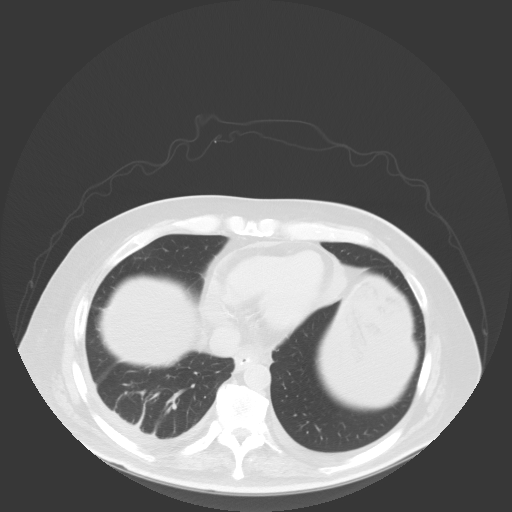
[im 119/155  lung]
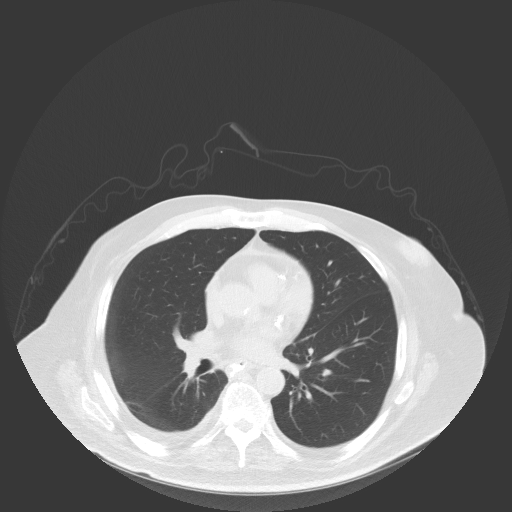
[im 131/155  mediastinal]
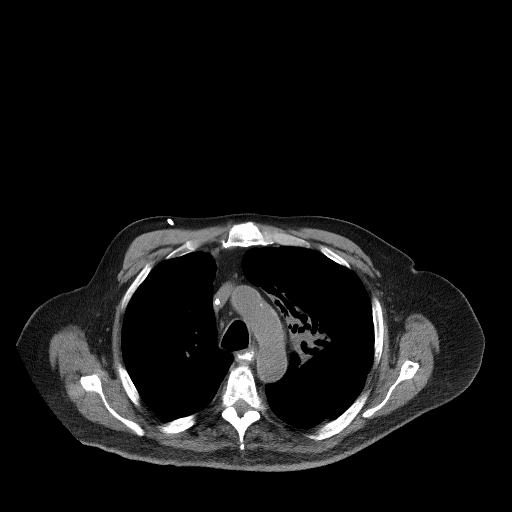
[im 131/155  lung]
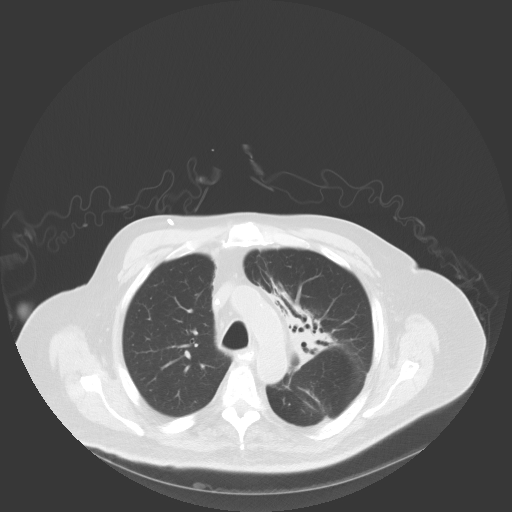
[im 143/155  lung]
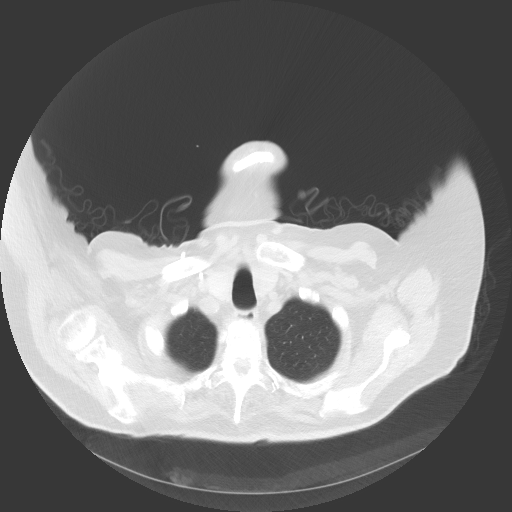

[14 of 31 positions shown; findings below may reference images not displayed]

FINDINGS: CT CHEST FINDINGS

Cardiovascular: The heart size is normal. No pericardial effusion.
Trace pericardial effusion. Coronary artery calcification is noted.
Atherosclerotic calcification is noted in the wall of the thoracic
aorta. Right Port-A-Cath tip distal SVC.

Mediastinum/Nodes: No mediastinal lymphadenopathy. No evidence for
gross hilar lymphadenopathy although assessment is limited by the
lack of intravenous contrast on today's study. Circumferential wall
thickening noted distal esophagus There is no axillary
lymphadenopathy.

Lungs/Pleura: Dependent mucus in the trachea. Postradiation changes
left parahilar lung similar to previous. Emphysema. Right lung
calcified granuloma. Stable appearance rounded opacity posterior
right lower lobe suggesting rounded atelectasis. Tiny bilateral
pleural effusions are stable.

Musculoskeletal: Bone windows reveal no worrisome lytic or sclerotic
osseous lesions.

CT ABDOMEN PELVIS FINDINGS

Hepatobiliary: Cirrhotic changes in the liver similar to prior.
Gallbladder surgically absent. No intrahepatic or extrahepatic
biliary dilation.

Pancreas: No focal mass lesion. No dilatation of the main duct. No
intraparenchymal cyst. No peripancreatic edema.

Spleen: Spleen measures 19 cm craniocaudal length.

Adrenals/Urinary Tract: No adrenal nodule or mass. No hydronephrosis
or gross renal mass. No evidence for hydroureter. The urinary
bladder appears normal for the degree of distention.

Stomach/Bowel: Stomach is nondistended. No gastric wall thickening.
No evidence of outlet obstruction. Duodenal diverticulum noted. No
small bowel wall thickening. No small bowel dilatation. Terminal
ileum and appendix not clearly visualized. No gross colonic mass. No
colonic wall thickening. No substantial diverticular change.

Vascular/Lymphatic: There is abdominal aortic atherosclerosis
without aneurysm. There is no gastrohepatic or hepatoduodenal
ligament lymphadenopathy. No intraperitoneal or retroperitoneal
lymphadenopathy. No pelvic sidewall lymphadenopathy.

Reproductive: The prostate gland and seminal vesicles have normal
imaging features.

Other: Large volume ascites.  Body wall edema noted in the pelvis.

Musculoskeletal: Bone windows reveal no worrisome lytic or sclerotic
osseous lesions.
IMPRESSION: 1. Post treatment changes medial left lung, stable.
2. Cirrhosis with large volume ascites and splenomegaly suggesting
portal venous hypertension.
3. Mild circumferential wall thickening distal esophagus.
Esophagitis would be a consideration.
4. Tiny bilateral pleural effusions.
